# Patient Record
Sex: Male | Born: 1960 | Race: White | Hispanic: No | Marital: Single | Smoking: Current some day smoker
Health system: Southern US, Community
[De-identification: ages and names within clinical notes are randomized; demographics above are authoritative.]

## PROBLEM LIST (undated history)

## (undated) DIAGNOSIS — I10 Essential (primary) hypertension: Secondary | ICD-10-CM

## (undated) DIAGNOSIS — C801 Malignant (primary) neoplasm, unspecified: Secondary | ICD-10-CM

## (undated) DIAGNOSIS — F1021 Alcohol dependence, in remission: Secondary | ICD-10-CM

## (undated) DIAGNOSIS — J449 Chronic obstructive pulmonary disease, unspecified: Secondary | ICD-10-CM

## (undated) DIAGNOSIS — F419 Anxiety disorder, unspecified: Secondary | ICD-10-CM

## (undated) HISTORY — PX: BLADDER SURGERY: SHX569

---

## 2002-05-08 ENCOUNTER — Encounter: Payer: Self-pay | Admitting: Family Medicine

## 2002-05-08 ENCOUNTER — Ambulatory Visit (HOSPITAL_COMMUNITY): Admission: RE | Admit: 2002-05-08 | Discharge: 2002-05-08 | Payer: Self-pay | Admitting: Family Medicine

## 2011-02-21 ENCOUNTER — Encounter: Payer: Self-pay | Admitting: Emergency Medicine

## 2011-02-21 ENCOUNTER — Emergency Department (HOSPITAL_COMMUNITY)
Admission: EM | Admit: 2011-02-21 | Discharge: 2011-02-21 | Disposition: A | Discharge status: Home / Self Care | Payer: Self-pay | Attending: Emergency Medicine | Admitting: Emergency Medicine

## 2011-02-21 DIAGNOSIS — S61509A Unspecified open wound of unspecified wrist, initial encounter: Secondary | ICD-10-CM | POA: Insufficient documentation

## 2011-02-21 DIAGNOSIS — S61519A Laceration without foreign body of unspecified wrist, initial encounter: Secondary | ICD-10-CM

## 2011-02-21 DIAGNOSIS — F17200 Nicotine dependence, unspecified, uncomplicated: Secondary | ICD-10-CM | POA: Insufficient documentation

## 2011-02-21 DIAGNOSIS — W268XXA Contact with other sharp object(s), not elsewhere classified, initial encounter: Secondary | ICD-10-CM | POA: Insufficient documentation

## 2011-02-21 HISTORY — DX: Essential (primary) hypertension: I10

## 2011-02-21 MED ORDER — TETANUS-DIPHTH-ACELL PERTUSSIS 5-2.5-18.5 LF-MCG/0.5 IM SUSP
0.5000 mL | Freq: Once | INTRAMUSCULAR | Status: AC
  Administered 2011-02-21: 0.5 mL via INTRAMUSCULAR
  Filled 2011-02-21: qty 0.5

## 2011-02-21 NOTE — ED Notes (Signed)
Pt c/o laceration to r wrist from chain link fence. Puncture wound noted. Last tetanus > 20 years

## 2011-02-21 NOTE — ED Provider Notes (Signed)
History     CSN: [id number] Arrival date & time: 02/21/2011  3:33 PM  Chief Complaint  Patient presents with  . Laceration   Patient is a 49 y.o. male presenting with skin laceration. The history is provided by the patient. No language interpreter was used.  Laceration  The incident occurred less than 1 hour ago. The laceration is located on the right arm. The laceration is 1 cm in size. Injury mechanism: barb wire fencing. The pain is at a severity of 3/10. The pain has been constant since onset. He reports no foreign bodies present. His tetanus status is out of date.    Past Medical History  Diagnosis Date  . Hypertension     History reviewed. No pertinent past surgical history.  History reviewed. No pertinent family history.  History  Substance Use Topics  . Smoking status: Current Some Day Smoker -- 1.0 packs/day  . Smokeless tobacco: Not on file  . Alcohol Use: 3.6 oz/week    6 Cans of beer per week     heavy      Review of Systems  Skin: Positive for wound.  All other systems reviewed and are negative.    Physical Exam  BP 150/103  Pulse 99  Temp(Src) 98.6 F (37 C) (Oral)  Resp 20  Ht 5\' 8"  (1.727 m)  Wt 158 lb (71.668 kg)  BMI 24.02 kg/m2  SpO2 99%  Physical Exam  Nursing note and vitals reviewed. Constitutional: He is oriented to person, place, and time. Vital signs are normal. He appears well-developed and well-nourished. No distress.  HENT:  Head: Normocephalic and atraumatic.  Right Ear: External ear normal.  Left Ear: External ear normal.  Nose: Nose normal.  Mouth/Throat: No oropharyngeal exudate.  Eyes: Conjunctivae and EOM are normal. Pupils are equal, round, and reactive to light. Right eye exhibits no discharge. Left eye exhibits no discharge. No scleral icterus.  Neck: Normal range of motion. Neck supple. No JVD present. No tracheal deviation present. No thyromegaly present.  Cardiovascular: Normal rate, regular rhythm, normal heart  sounds, intact distal pulses and normal pulses.  Exam reveals no gallop and no friction rub.   No murmur heard. Pulmonary/Chest: Effort normal and breath sounds normal. No stridor. No respiratory distress. He has no wheezes. He has no rales. He exhibits no tenderness.  Abdominal: Soft. Normal appearance and bowel sounds are normal. He exhibits no distension and no mass. There is no tenderness. There is no rebound and no guarding.  Musculoskeletal: Normal range of motion. He exhibits no edema and no tenderness.       Right wrist: He exhibits laceration. He exhibits normal range of motion, no tenderness, no bony tenderness, no swelling, no effusion, no crepitus and no deformity.       Hands: Lymphadenopathy:    He has no cervical adenopathy.  Neurological: He is alert and oriented to person, place, and time. He has normal reflexes. No cranial nerve deficit. Coordination normal. GCS eye subscore is 4. GCS verbal subscore is 5. GCS motor subscore is 6.  Reflex Scores:      Tricep reflexes are 2+ on the right side and 2+ on the left side.      Bicep reflexes are 2+ on the right side and 2+ on the left side.      Brachioradialis reflexes are 2+ on the right side and 2+ on the left side.      Patellar reflexes are 2+ on the right side and 2+  on the left side.      Achilles reflexes are 2+ on the right side and 2+ on the left side. Skin: Skin is warm and dry. No rash noted. He is not diaphoretic.  Psychiatric: He has a normal mood and affect. His speech is normal and behavior is normal. Judgment and thought content normal. Cognition and memory are normal.    ED Course  LACERATION REPAIR Date/Time: 02/21/2011 3:56 PM Performed by: Worthy Rancher Authorized by: Jasmine Awe Consent: Verbal consent obtained. Written consent not obtained. Risks and benefits: risks, benefits and alternatives were discussed Consent given by: patient Patient understanding: patient states understanding of the  procedure being performed Relevant documents: relevant documents not present or verified Test results: test results not available Site marked: the operative site was not marked Imaging studies: imaging studies not available Patient identity confirmed: verbally with patient Time out: Immediately prior to procedure a "time out" was called to verify the correct patient, procedure, equipment, support staff and site/side marked as required. Body area: upper extremity Location details: right wrist Laceration length: 1 cm Foreign bodies: metal Tendon involvement: none Nerve involvement: none Vascular damage: no Anesthesia method: none. Patient sedated: no Amount of cleaning: standard Debridement: none Degree of undermining: none Suture technique: applied drmabond. Approximation: close Approximation difficulty: simple    MDM       Worthy Rancher, PA 02/21/11 1553  Worthy Rancher, PA 02/21/11 1553  Worthy Rancher, [place] 02/21/11 [phone]

## 2011-02-22 NOTE — ED Provider Notes (Signed)
Evaluation and management procedures were performed by the mid-level provider (PA/NP/CNM) under my supervision/collaboration.   Vida Roller, MD 02/22/11 [phone]

## 2016-12-28 ENCOUNTER — Telehealth: Payer: Self-pay

## 2016-12-28 NOTE — Telephone Encounter (Signed)
[phone]  [phone] (SISTER),  PATIENT RECEIVED LETTER TO SCHEDULE TCS  PLEASE CALL HIS SISTER, SHE SAYS HE WILL NOT ANSWER THE PHONE AND SHE IS HIS "SECRETARY" I EXPLAINED TO HER THAT WE MAY NOT BE ABLE TO DISCUSS WITH HER ANY INFORMATION

## 2016-12-28 NOTE — Telephone Encounter (Signed)
Patient called in and stated he's not having any GI problems and he's not on any blood thinners.  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  Patient is taking Prozac and Hydroxyzine pamoate.  Routing to [place] to see if patient requires an ov, he can come in the morning,  Mon-Wed  Patient can be reached at [phone].

## 2017-01-04 ENCOUNTER — Telehealth: Payer: Self-pay

## 2017-01-04 NOTE — Telephone Encounter (Signed)
Pt is coming into the office on Aug 14

## 2017-01-04 NOTE — Telephone Encounter (Signed)
Called and spoke to his sister Arnette Schaumann). She knew some of his medications but not the dose. Some meds are prescribed but he doesn't take them. She is not sure if he would answer the phone if I call him and he doesn't know how to check voicemail. Tried to call pt, no answer. Called sister back and scheduled OV to set-up TCS d/t unable to obtain info that is needed. Sister states she takes him to all appt because he doesn't drive. He hadn't been to the doctor in about 20 years before she got him an appt at [facility]. History of alcohol use according to previous phone note. OV with [facility] scheduled for 03/01/17 at 8:30am. Sister said she will let him know. Routing to DS as FYI.

## 2017-03-01 ENCOUNTER — Ambulatory Visit: Payer: Self-pay | Admitting: Gastroenterology

## 2017-05-02 ENCOUNTER — Encounter: Payer: Self-pay | Admitting: Gastroenterology

## 2017-05-02 ENCOUNTER — Ambulatory Visit: Payer: Self-pay | Admitting: Gastroenterology

## 2017-05-02 ENCOUNTER — Telehealth: Payer: Self-pay | Admitting: Gastroenterology

## 2017-05-02 NOTE — Telephone Encounter (Signed)
PATIENT WAS A NO SHOW AND LETTER SENT  °

## 2017-09-12 ENCOUNTER — Ambulatory Visit (INDEPENDENT_AMBULATORY_CARE_PROVIDER_SITE_OTHER): Payer: Medicaid Other | Admitting: Otolaryngology

## 2017-09-12 DIAGNOSIS — R070 Pain in throat: Secondary | ICD-10-CM | POA: Diagnosis not present

## 2017-09-12 DIAGNOSIS — D487 Neoplasm of uncertain behavior of other specified sites: Secondary | ICD-10-CM

## 2017-09-12 DIAGNOSIS — D380 Neoplasm of uncertain behavior of larynx: Secondary | ICD-10-CM | POA: Diagnosis not present

## 2017-09-13 ENCOUNTER — Other Ambulatory Visit: Payer: Self-pay | Admitting: Otolaryngology

## 2017-09-15 ENCOUNTER — Encounter (HOSPITAL_BASED_OUTPATIENT_CLINIC_OR_DEPARTMENT_OTHER): Payer: Self-pay | Admitting: *Deleted

## 2017-09-16 ENCOUNTER — Encounter (HOSPITAL_COMMUNITY)
Admission: RE | Admit: 2017-09-16 | Discharge: 2017-09-16 | Disposition: A | Discharge status: Home / Self Care | Payer: Medicaid Other | Source: Ambulatory Visit | Attending: Otolaryngology | Admitting: Otolaryngology

## 2017-09-16 DIAGNOSIS — I4510 Unspecified right bundle-branch block: Secondary | ICD-10-CM | POA: Diagnosis not present

## 2017-09-16 DIAGNOSIS — I10 Essential (primary) hypertension: Secondary | ICD-10-CM | POA: Diagnosis not present

## 2017-09-16 DIAGNOSIS — Z01810 Encounter for preprocedural cardiovascular examination: Secondary | ICD-10-CM | POA: Diagnosis not present

## 2017-09-20 ENCOUNTER — Encounter (HOSPITAL_BASED_OUTPATIENT_CLINIC_OR_DEPARTMENT_OTHER)
Admission: RE | Disposition: A | Discharge status: Home / Self Care | Payer: Self-pay | Source: Ambulatory Visit | Attending: Otolaryngology

## 2017-09-20 ENCOUNTER — Ambulatory Visit (HOSPITAL_BASED_OUTPATIENT_CLINIC_OR_DEPARTMENT_OTHER): Payer: Medicaid Other | Admitting: Certified Registered"

## 2017-09-20 ENCOUNTER — Encounter (HOSPITAL_BASED_OUTPATIENT_CLINIC_OR_DEPARTMENT_OTHER): Payer: Self-pay | Admitting: *Deleted

## 2017-09-20 ENCOUNTER — Ambulatory Visit (HOSPITAL_BASED_OUTPATIENT_CLINIC_OR_DEPARTMENT_OTHER)
Admission: RE | Admit: 2017-09-20 | Discharge: 2017-09-20 | Disposition: A | Discharge status: Home / Self Care | Payer: Medicaid Other | Source: Ambulatory Visit | Attending: Otolaryngology | Admitting: Otolaryngology

## 2017-09-20 DIAGNOSIS — F419 Anxiety disorder, unspecified: Secondary | ICD-10-CM | POA: Insufficient documentation

## 2017-09-20 DIAGNOSIS — F329 Major depressive disorder, single episode, unspecified: Secondary | ICD-10-CM | POA: Diagnosis not present

## 2017-09-20 DIAGNOSIS — C329 Malignant neoplasm of larynx, unspecified: Secondary | ICD-10-CM | POA: Diagnosis not present

## 2017-09-20 DIAGNOSIS — D380 Neoplasm of uncertain behavior of larynx: Secondary | ICD-10-CM | POA: Diagnosis not present

## 2017-09-20 DIAGNOSIS — Z79899 Other long term (current) drug therapy: Secondary | ICD-10-CM | POA: Insufficient documentation

## 2017-09-20 DIAGNOSIS — J449 Chronic obstructive pulmonary disease, unspecified: Secondary | ICD-10-CM | POA: Diagnosis not present

## 2017-09-20 DIAGNOSIS — I10 Essential (primary) hypertension: Secondary | ICD-10-CM | POA: Diagnosis not present

## 2017-09-20 DIAGNOSIS — F17210 Nicotine dependence, cigarettes, uncomplicated: Secondary | ICD-10-CM | POA: Diagnosis not present

## 2017-09-20 DIAGNOSIS — Z881 Allergy status to other antibiotic agents status: Secondary | ICD-10-CM | POA: Diagnosis not present

## 2017-09-20 DIAGNOSIS — R221 Localized swelling, mass and lump, neck: Secondary | ICD-10-CM

## 2017-09-20 DIAGNOSIS — J387 Other diseases of larynx: Secondary | ICD-10-CM | POA: Diagnosis present

## 2017-09-20 HISTORY — DX: Anxiety disorder, unspecified: F41.9

## 2017-09-20 HISTORY — DX: Alcohol dependence, in remission: F10.21

## 2017-09-20 HISTORY — PX: MICROLARYNGOSCOPY: SHX5208

## 2017-09-20 SURGERY — MICROLARYNGOSCOPY
Anesthesia: General

## 2017-09-20 MED ORDER — LIDOCAINE 2% (20 MG/ML) 5 ML SYRINGE
INTRAMUSCULAR | Status: AC
  Filled 2017-09-20: qty 5

## 2017-09-20 MED ORDER — PROMETHAZINE HCL 25 MG/ML IJ SOLN: 6.2500 mg | INTRAMUSCULAR | Status: DC | PRN

## 2017-09-20 MED ORDER — LIDOCAINE HCL (CARDIAC) 20 MG/ML IV SOLN
INTRAVENOUS | Status: DC | PRN
  Administered 2017-09-20: 60 mg via INTRAVENOUS

## 2017-09-20 MED ORDER — FENTANYL CITRATE (PF) 100 MCG/2ML IJ SOLN: 25.0000 ug | INTRAMUSCULAR | Status: DC | PRN

## 2017-09-20 MED ORDER — DEXAMETHASONE SODIUM PHOSPHATE 4 MG/ML IJ SOLN
INTRAMUSCULAR | Status: DC | PRN
  Administered 2017-09-20: 10 mg via INTRAVENOUS

## 2017-09-20 MED ORDER — ROCURONIUM BROMIDE 10 MG/ML (PF) SYRINGE
PREFILLED_SYRINGE | INTRAVENOUS | Status: AC
  Filled 2017-09-20: qty 5

## 2017-09-20 MED ORDER — OXYCODONE HCL 5 MG/5ML PO SOLN: 5.0000 mg | Freq: Once | ORAL | Status: DC | PRN

## 2017-09-20 MED ORDER — EPHEDRINE SULFATE 50 MG/ML IJ SOLN
INTRAMUSCULAR | Status: DC | PRN
  Administered 2017-09-20: 15 mg via INTRAVENOUS

## 2017-09-20 MED ORDER — SUCCINYLCHOLINE CHLORIDE 20 MG/ML IJ SOLN
INTRAMUSCULAR | Status: DC | PRN
  Administered 2017-09-20: 120 mg via INTRAVENOUS
  Administered 2017-09-20 (×2): 80 mg via INTRAVENOUS

## 2017-09-20 MED ORDER — MIDAZOLAM HCL 2 MG/2ML IJ SOLN: 1.0000 mg | INTRAMUSCULAR | Status: DC | PRN

## 2017-09-20 MED ORDER — FENTANYL CITRATE (PF) 100 MCG/2ML IJ SOLN
INTRAMUSCULAR | Status: AC
  Filled 2017-09-20: qty 2

## 2017-09-20 MED ORDER — ONDANSETRON HCL 4 MG/2ML IJ SOLN
INTRAMUSCULAR | Status: DC | PRN
  Administered 2017-09-20: 4 mg via INTRAVENOUS

## 2017-09-20 MED ORDER — PROPOFOL 10 MG/ML IV BOLUS
INTRAVENOUS | Status: DC | PRN
  Administered 2017-09-20: 150 mg via INTRAVENOUS

## 2017-09-20 MED ORDER — SCOPOLAMINE 1 MG/3DAYS TD PT72: 1.0000 | MEDICATED_PATCH | Freq: Once | TRANSDERMAL | Status: DC | PRN

## 2017-09-20 MED ORDER — SUCCINYLCHOLINE CHLORIDE 200 MG/10ML IV SOSY
PREFILLED_SYRINGE | INTRAVENOUS | Status: AC
  Filled 2017-09-20: qty 10

## 2017-09-20 MED ORDER — ALBUMIN HUMAN 5 % IV SOLN
INTRAVENOUS | Status: AC
  Filled 2017-09-20: qty 250

## 2017-09-20 MED ORDER — ALBUMIN HUMAN 5 % IV SOLN
12.5000 g | Freq: Once | INTRAVENOUS | Status: AC
  Administered 2017-09-20: 12.5 g via INTRAVENOUS

## 2017-09-20 MED ORDER — PROPOFOL 10 MG/ML IV BOLUS
INTRAVENOUS | Status: AC
  Filled 2017-09-20: qty 20

## 2017-09-20 MED ORDER — MEPERIDINE HCL 25 MG/ML IJ SOLN: 6.2500 mg | INTRAMUSCULAR | Status: DC | PRN

## 2017-09-20 MED ORDER — LACTATED RINGERS IV SOLN
INTRAVENOUS | Status: DC
  Administered 2017-09-20 (×3): via INTRAVENOUS

## 2017-09-20 MED ORDER — LACTATED RINGERS IV SOLN: INTRAVENOUS | Status: DC

## 2017-09-20 MED ORDER — OXYCODONE HCL 5 MG PO TABS: 5.0000 mg | ORAL_TABLET | Freq: Once | ORAL | Status: DC | PRN

## 2017-09-20 MED ORDER — DEXAMETHASONE SODIUM PHOSPHATE 10 MG/ML IJ SOLN
INTRAMUSCULAR | Status: AC
  Filled 2017-09-20: qty 1

## 2017-09-20 MED ORDER — FENTANYL CITRATE (PF) 100 MCG/2ML IJ SOLN
50.0000 ug | INTRAMUSCULAR | Status: DC | PRN
  Administered 2017-09-20: 100 ug via INTRAVENOUS

## 2017-09-20 MED ORDER — ONDANSETRON HCL 4 MG/2ML IJ SOLN
INTRAMUSCULAR | Status: AC
  Filled 2017-09-20: qty 2

## 2017-09-20 MED ORDER — EPINEPHRINE PF 1 MG/ML IJ SOLN
INTRAMUSCULAR | Status: DC | PRN
  Administered 2017-09-20: 1 mg

## 2017-09-20 SURGICAL SUPPLY — 31 items
ADAPTER TUBE FLEX ULTRASET (MISCELLANEOUS) ×2 IMPLANT
CANISTER SUCT 1200ML W/VALVE (MISCELLANEOUS) ×2 IMPLANT
CONT SPEC 4OZ CLIKSEAL STRL BL (MISCELLANEOUS) ×2 IMPLANT
GAUZE SPONGE 4X4 12PLY STRL LF (GAUZE/BANDAGES/DRESSINGS) ×4 IMPLANT
GLOVE BIO SURGEON STRL SZ7 (GLOVE) ×1 IMPLANT
GLOVE BIO SURGEON STRL SZ7.5 (GLOVE) ×2 IMPLANT
GLOVE BIOGEL PI IND STRL 6.5 (GLOVE) IMPLANT
GLOVE BIOGEL PI INDICATOR 6.5 (GLOVE) ×1
GOWN STRL REUS W/ TWL LRG LVL3 (GOWN DISPOSABLE) IMPLANT
GOWN STRL REUS W/ TWL XL LVL3 (GOWN DISPOSABLE) IMPLANT
GOWN STRL REUS W/TWL LRG LVL3 (GOWN DISPOSABLE) ×2
GOWN STRL REUS W/TWL XL LVL3 (GOWN DISPOSABLE) ×2
GUARD TEETH (MISCELLANEOUS) IMPLANT
MARKER SKIN DUAL TIP RULER LAB (MISCELLANEOUS) IMPLANT
NDL HYPO 18GX1.5 BLUNT FILL (NEEDLE) ×1 IMPLANT
NDL SPNL 22GX7 QUINCKE BK (NEEDLE) IMPLANT
NDL SPNL 25GX3.5 QUINCKE BL (NEEDLE) ×1 IMPLANT
NEEDLE HYPO 18GX1.5 BLUNT FILL (NEEDLE) ×2 IMPLANT
NEEDLE SPNL 22GX7 QUINCKE BK (NEEDLE) IMPLANT
NEEDLE SPNL 25GX3.5 QUINCKE BL (NEEDLE) ×2 IMPLANT
NS IRRIG 1000ML POUR BTL (IV SOLUTION) ×2 IMPLANT
PACK BASIN DAY SURGERY FS (CUSTOM PROCEDURE TRAY) ×2 IMPLANT
PATTIES SURGICAL .5 X3 (DISPOSABLE) ×2 IMPLANT
SHEET MEDIUM DRAPE 40X70 STRL (DRAPES) ×2 IMPLANT
SLEEVE SCD COMPRESS KNEE MED (MISCELLANEOUS) IMPLANT
SOLUTION BUTLER CLEAR DIP (MISCELLANEOUS) ×2 IMPLANT
SURGILUBE 2OZ TUBE FLIPTOP (MISCELLANEOUS) IMPLANT
SYR CONTROL 10ML LL (SYRINGE) IMPLANT
SYR TB 1ML LL NO SAFETY (SYRINGE) ×2 IMPLANT
TOWEL OR 17X24 6PK STRL BLUE (TOWEL DISPOSABLE) ×2 IMPLANT
TUBE CONNECTING 20X1/4 (TUBING) ×2 IMPLANT

## 2017-09-20 NOTE — Anesthesia Postprocedure Evaluation (Signed)
Anesthesia Post Note  Patient: Timothy Parks  Procedure(s) Performed: MICRO LARYNGOSCOPY WITH BIOPSY OF LARYNGEAL MASS (N/A )     Patient location during evaluation: PACU Anesthesia Type: General Level of consciousness: awake and alert Pain management: pain level controlled Vital Signs Assessment: post-procedure vital signs reviewed and stable Respiratory status: spontaneous breathing, nonlabored ventilation, respiratory function stable and patient connected to nasal cannula oxygen Cardiovascular status: blood pressure returned to baseline and stable Postop Assessment: no apparent nausea or vomiting Anesthetic complications: no    Last Vitals:  Vitals:   09/20/17 1330 09/20/17 1400  BP: 133/79 140/72  Pulse: 88 83  Resp: 17 16  Temp:  36.4 C  SpO2: 95% 98%    Last Pain:  Vitals:   09/20/17 1400  TempSrc:   PainSc: 3                  Effie Berkshire

## 2017-09-20 NOTE — Transfer of Care (Signed)
Immediate Anesthesia Transfer of Care Note  Patient: Timothy Parks  Procedure(s) Performed: MICRO LARYNGOSCOPY WITH BIOPSY OF LARYNGEAL MASS (N/A )  Patient Location: PACU  Anesthesia Type:General  Level of Consciousness: awake, alert  and oriented  Airway & Oxygen Therapy: Patient Spontanous Breathing and Patient connected to face mask oxygen  Post-op Assessment: Report given to RN and Post -op Vital signs reviewed and unstable, Anesthesiologist notified  Post vital signs: Reviewed, stable and unstable  Last Vitals:  Vitals:   09/20/17 0916  BP: (!) 160/90  Pulse: (!) 109  Resp: 16  Temp: 36.8 C  SpO2: 100%    Last Pain:  Vitals:   09/20/17 0916  TempSrc: Oral  PainSc: 0-No pain         Complications: No apparent anesthesia complications

## 2017-09-20 NOTE — H&P (Signed)
Cc: Neck mass  HPI: The patient is a 57 y/o male who presents today for evaluation of a neck mass. The patient is seen in consultation requested by [facility] The patient first noticed the mass 4 months ago. It has not changed in size and is not painful. The patient has noted intermittent throat pain when he swallows a certain way. He denies any difficulty breathing or eating. No imaging has been obtained. The patient was a 3 ppd smoker but is now down to 2 cigarettes per day. No previous ENT surgery is noted.   The patient's review of systems (constitutional, eyes, ENT, cardiovascular, respiratory, GI, musculoskeletal, skin, neurologic, psychiatric, endocrine, hematologic, allergic) is noted in the ROS questionnaire.  It is reviewed with the patient.   Family health history: None.  Major events: Bladder surgery as a child.  Ongoing medical problems: Depression, hypertension, COPD, reflux, anxiety.  Social history: The patient is single. He chews tobacco daily. Previous heavy smoker. He denies the use of alcohol or illegal drugs. He is a recovering alcoholic.   Exam General: Communicates without difficulty, well nourished, no acute distress. Head: Normocephalic, no evidence injury, no tenderness, facial buttresses intact without stepoff. Eyes: PERRL, EOMI.  No scleral icterus, conjunctivae clear. Ears: External auditory canals clear bilaterally.  There is no edema or erythema.  Tympanic membrane is within normal limits bilaterally. Nose: Normal skin and external support.  Anterior rhinoscopy reveals healthy pink mucosa over the septum and turbinates.  No lesions or polyps were seen. Oral cavity: Lips without lesions, oral mucosa moist, no masses or lesions seen. Indirect  mirror laryngoscopy could not be tolerated. Pharynx: Clear, no erythema. Neck: Supple, full range of motion. Left level II, 2 cm firm neck mass. Salivary: Parotid and submandibular glands without mass. Neuro:  CN 2-12  grossly intact. Gait normal. Vestibular: No nystagmus at any point of gaze.   Procedure:  Flexible Fiberoptic Laryngoscopy -- Risks, benefits, and alternatives of flexible endoscopy were explained to the patient.  Specific mention was made of the risk of throat numbness with difficulty swallowing, possible bleeding from the nose and mouth, and pain from the procedure.  The patient gave oral consent to proceed.  The nasal cavities were decongested and anesthetised with a combination of oxymetazoline and 4% lidocaine solution.  The flexible scope was inserted into the right nasal cavity and advanced towards the nasopharynx.  Visualized mucosa over the turbinates and septum were as described above.  The nasopharynx was clear.  Oropharyngeal walls were symmetric and mobile without lesion, mass, or edema. A fungating 1 cm mass at the left arytenoid.  Base of tongue was within normal limits.  The patient tolerated the procedure well.   Assessment A fungating 1 cm mass is noted at the left arytenoid, with a level II firm lymph node. 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  Plan  1. The physical exam and laryngoscopy findings are reviewed with the patient. Differential diagnosis is also discussed.  2. Plan micro DL with biopsy. The risks, benefits, alternatives, and details of the procedure are reviewed with the patient. Questions are invited and answered. 3. Neck CT with contrast.

## 2017-09-20 NOTE — Anesthesia Preprocedure Evaluation (Addendum)
Anesthesia Evaluation  Patient identified by MRN, date of birth, ID band Patient awake    Reviewed: Allergy & Precautions, NPO status , Patient's Chart, lab work & pertinent test results  Airway Mallampati: III  TM Distance: >3 FB Neck ROM: Full    Dental  (+) Partial Lower, Partial Upper, Dental Advisory Given   Pulmonary Current Smoker,    breath sounds clear to auscultation (-) decreased breath sounds      Cardiovascular hypertension, Pt. on medications  Rhythm:Regular Rate:Normal     Neuro/Psych PSYCHIATRIC DISORDERS Anxiety negative neurological ROS     GI/Hepatic negative GI ROS, Neg liver ROS,   Endo/Other  negative endocrine ROS  Renal/GU negative Renal ROS     Musculoskeletal negative musculoskeletal ROS (+)   Abdominal Normal abdominal exam  (+)   Peds negative pediatric ROS (+)  Hematology   Anesthesia Other Findings   Reproductive/Obstetrics negative OB ROS                            Anesthesia Physical Anesthesia Plan  ASA: II  Anesthesia Plan: General   Post-op Pain Management:    Induction: Intravenous  PONV Risk Score and Plan: 2 and Ondansetron and Dexamethasone  Airway Management Planned: Oral ETT  Additional Equipment: None  Intra-op Plan:   Post-operative Plan: Extubation in OR  Informed Consent: I have reviewed the patients History and Physical, chart, labs and discussed the procedure including the risks, benefits and alternatives for the proposed anesthesia with the patient or authorized representative who has indicated his/her understanding and acceptance.   Dental advisory given  Plan Discussed with: CRNA  Anesthesia Plan Comments:         Anesthesia Quick Evaluation

## 2017-09-20 NOTE — Discharge Instructions (Addendum)
The patient may resume all his previous activities and diet. He will follow-up in my [place] office in one      [place] Instructions  Activity: Get plenty of rest for the remainder of the day. A responsible individual must stay with you for 24 hours following the procedure.  For the next 24 hours, DO NOT: -Drive a car -Paediatric nurse -Drink alcoholic beverages -Take any medication unless instructed by your physician -Make any legal decisions or sign important papers.  Meals: Start with liquid foods such as gelatin or soup. Progress to regular foods as tolerated. Avoid greasy, spicy, heavy foods. If nausea and/or vomiting occur, drink only clear liquids until the nausea and/or vomiting subsides. Call your physician if vomiting continues.  Special Instructions/Symptoms: Your throat may feel dry or sore from the anesthesia or the breathing tube placed in your throat during surgery. If this causes discomfort, gargle with warm salt water. The discomfort should disappear within 24 hours.  If you had a scopolamine patch placed behind your ear for the management of post- operative nausea and/or vomiting:  1. The medication in the patch is effective for 72 hours, after which it should be removed.  Wrap patch in a tissue and discard in the trash. Wash hands thoroughly with soap and water. 2. You may remove the patch earlier than 72 hours if you experience unpleasant side effects which may include dry mouth, dizziness or visual disturbances. 3. Avoid touching the patch. Wash your hands with soap and water after contact with the patch.   Post Anesthesia Home Care Instructions  Activity: Get plenty of rest for the remainder of the day. A responsible individual must stay with you for 24 hours following the procedure.  For the next 24 hours, DO NOT: -Drive a car -Paediatric nurse -Drink alcoholic beverages -Take any medication unless instructed by your physician -Make  any legal decisions or sign important papers.  Meals: Start with liquid foods such as gelatin or soup. Progress to regular foods as tolerated. Avoid greasy, spicy, heavy foods. If nausea and/or vomiting occur, drink only clear liquids until the nausea and/or vomiting subsides. Call your physician if vomiting continues.  Special Instructions/Symptoms: Your throat may feel dry or sore from the anesthesia or the breathing tube placed in your throat during surgery. If this causes discomfort, gargle with warm salt water. The discomfort should disappear within 24 hours.  If you had a scopolamine patch placed behind your ear for the management of post- operative nausea and/or vomiting:  1. The medication in the patch is effective for 72 hours, after which it should be removed.  Wrap patch in a tissue and discard in the trash. Wash hands thoroughly with soap and water. 2. You may remove the patch earlier than 72 hours if you experience unpleasant side effects which may include dry mouth, dizziness or visual disturbances. 3. Avoid touching the patch. Wash your hands with soap and water after contact with the patch.

## 2017-09-20 NOTE — Op Note (Signed)
DATE OF PROCEDURE:  09/20/2017                              OPERATIVE REPORT  SURGEON:  Leta Baptist, MD  PREOPERATIVE DIAGNOSES: 1. Left laryngeal mass. 2. Left level II neck mass.  POSTOPERATIVE DIAGNOSES: 1. Left laryngeal mass. 2. Left level II neck mass.  PROCEDURE PERFORMED:  MicroDirect laryngoscopy with biopsy of laryngeal mass.  ANESTHESIA:  General endotracheal tube anesthesia.  COMPLICATIONS:  None.  ESTIMATED BLOOD LOSS:  Minimal.  INDICATION FOR PROCEDURE:  Timothy Parks is a 58 y.o. male who noted a left neck mass 4 months ago. The patient has been complaining of intermittent left-sided throat pain. He has no breathing difficulty. On his fiberoptic laryngoscopy examination, a fungating 1 cm mass was noted to be attached to the left arytenoid. 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 Based on the above findings, the decision was made for the patient to undergo the above-stated procedure.  The risks, benefits, alternatives, and details of the procedure were discussed with the patient.  Questions were invited and answered.  Informed consent was obtained.  DESCRIPTION:  The patient was taken to the operating room and placed supine on the operating table.  General endotracheal tube anesthesia was administered by the anesthesiologist.  The patient was positioned and prepped and draped in a standard fashion for direct laryngoscopy examination.  A Dedo laryngoscope was used for examination. The laryngoscope was inserted via the oral cavity into the pharynx. Examination of the vallecula, epiglottis, aryepiglottic folds, and piriform sinuses were all normal. Examination of the glottis showed a large 1 cm mass with attachment to the left arytenoid. Photodocumentation of the lesion was obtained. The Dedo laryngoscope was suspended with a Lewy suspender.  A microscope was brought into the field. Under the operating microscope, 5 biopsy specimens were  obtained from the laryngeal mass. Hemostasis was achieved with pledgets soaked with epinephrine. The Dedo laryngoscope was withdrawn.   The care of the patient was turned over to the anesthesiologist.  The patient was awakened from anesthesia without difficulty.  The patient was extubated and transferred to the recovery room in good condition.  OPERATIVE FINDINGS:  A 1 cm fungating mass was noted to be attached to the left arytenoid.  SPECIMEN: Laryngeal mass biopsy specimens.   FOLLOWUP CARE:  The patient will be discharged home once awake and alert. The patient will follow up in my office in approximately 1 week.  [name] 09/20/2017 12:07 PM

## 2017-09-20 NOTE — Anesthesia Procedure Notes (Addendum)
Procedure Name: Intubation Performed by: Verita Lamb, CRNA Pre-anesthesia Checklist: Emergency Drugs available, Patient identified, Suction available, Patient being monitored and Timeout performed Oxygen Delivery Method: Circle system utilized Preoxygenation: Pre-oxygenation with 100% oxygen Induction Type: IV induction Ventilation: Mask ventilation without difficulty Laryngoscope Size: Mac and 3 Grade View: Grade I Tube type: Oral Number of attempts: 1 Airway Equipment and Method: Stylet Secured at: 24 cm Tube secured with: Tape Dental Injury: Teeth and Oropharynx as per pre-operative assessment  Comments: Large mass at base of cords obscured glottic opening at first  With further manipulation I was able to achieve a view and insert ett.

## 2017-09-21 ENCOUNTER — Encounter (HOSPITAL_BASED_OUTPATIENT_CLINIC_OR_DEPARTMENT_OTHER): Payer: Self-pay | Admitting: Otolaryngology

## 2017-09-21 ENCOUNTER — Other Ambulatory Visit (INDEPENDENT_AMBULATORY_CARE_PROVIDER_SITE_OTHER): Payer: Self-pay | Admitting: Otolaryngology

## 2017-09-21 DIAGNOSIS — R221 Localized swelling, mass and lump, neck: Secondary | ICD-10-CM

## 2017-09-27 ENCOUNTER — Ambulatory Visit (HOSPITAL_COMMUNITY)
Admission: RE | Admit: 2017-09-27 | Discharge: 2017-09-27 | Disposition: A | Discharge status: Home / Self Care | Payer: Medicaid Other | Source: Ambulatory Visit | Attending: Otolaryngology | Admitting: Otolaryngology

## 2017-09-27 DIAGNOSIS — R599 Enlarged lymph nodes, unspecified: Secondary | ICD-10-CM | POA: Diagnosis not present

## 2017-09-27 DIAGNOSIS — R221 Localized swelling, mass and lump, neck: Secondary | ICD-10-CM | POA: Diagnosis present

## 2017-09-27 DIAGNOSIS — M47812 Spondylosis without myelopathy or radiculopathy, cervical region: Secondary | ICD-10-CM | POA: Insufficient documentation

## 2017-09-27 MED ORDER — IOPAMIDOL (ISOVUE-300) INJECTION 61%
75.0000 mL | Freq: Once | INTRAVENOUS | Status: AC | PRN
  Administered 2017-09-27: 75 mL via INTRAVENOUS

## 2017-09-29 ENCOUNTER — Inpatient Hospital Stay (HOSPITAL_COMMUNITY): Payer: Medicaid Other | Attending: Hematology | Admitting: Hematology

## 2017-09-29 ENCOUNTER — Other Ambulatory Visit: Payer: Self-pay

## 2017-09-29 ENCOUNTER — Encounter: Payer: Self-pay | Admitting: General Practice

## 2017-09-29 ENCOUNTER — Ambulatory Visit (INDEPENDENT_AMBULATORY_CARE_PROVIDER_SITE_OTHER): Payer: Medicaid Other | Admitting: Otolaryngology

## 2017-09-29 ENCOUNTER — Encounter (HOSPITAL_COMMUNITY): Payer: Self-pay | Admitting: Lab

## 2017-09-29 ENCOUNTER — Ambulatory Visit (HOSPITAL_COMMUNITY): Payer: Medicaid Other | Admitting: Hematology

## 2017-09-29 ENCOUNTER — Encounter (HOSPITAL_COMMUNITY): Payer: Self-pay | Admitting: Hematology

## 2017-09-29 VITALS — BP 142/79 | HR 93 | Temp 98.4°F | Resp 18 | Wt 204.8 lb

## 2017-09-29 DIAGNOSIS — F17210 Nicotine dependence, cigarettes, uncomplicated: Secondary | ICD-10-CM | POA: Insufficient documentation

## 2017-09-29 DIAGNOSIS — Z7189 Other specified counseling: Secondary | ICD-10-CM | POA: Insufficient documentation

## 2017-09-29 DIAGNOSIS — I10 Essential (primary) hypertension: Secondary | ICD-10-CM | POA: Insufficient documentation

## 2017-09-29 DIAGNOSIS — F1021 Alcohol dependence, in remission: Secondary | ICD-10-CM | POA: Insufficient documentation

## 2017-09-29 DIAGNOSIS — C329 Malignant neoplasm of larynx, unspecified: Secondary | ICD-10-CM | POA: Insufficient documentation

## 2017-09-29 DIAGNOSIS — F439 Reaction to severe stress, unspecified: Secondary | ICD-10-CM

## 2017-09-29 NOTE — Progress Notes (Signed)
[place] Psychosocial Distress Screening Clinical Social Work  Clinical Social Work was referred by distress screening protocol.  The patient scored a 7 on the Psychosocial Distress Thermometer which indicates moderate distress. Clinical Social Worker Edwyna Shell to assess for distress and other psychosocial needs. Anticipates minor difficulty w transportation, will depend on family primarily.  Is not aware of Medicaid transportation service, advised to call DSS to schedule rides if needed.  Also will mail information about cancer support resources in [place] and refer to [facility].  Lives alone but has support from mother and siblings.  "I am doing OK, I just try not to think about it!"  Was surprised by diagnosis of cancer - "I just had a cold and went to the doctor, I didn't expect cancer!"  CSW and patient discussed common feeling and emotions when being diagnosed with cancer, and the importance of support during treatment. CSW informed patient of the support team and support services at [facility]. CSW provided contact information and encouraged patient to call with any questions or concerns.  ONCBCN DISTRESS SCREENING 09/29/2017  Screening Type Initial Screening  Distress experienced in past week (1-10) 7  Emotional problem type Adjusting to illness  Information Concerns Type Lack of info about diagnosis;Lack of info about treatment  Physician notified of physical symptoms Yes    Clinical Social Worker follow up needed: No.  If yes, follow up plan:  Edwyna Shell, LCSW Clinical Social Worker Phone:  [phone]

## 2017-09-29 NOTE — Progress Notes (Signed)
[facility] NOTE  Patient Care Team: [facility], [place] as PCP - General (Family Medicine)  CHIEF COMPLAINTS/PURPOSE OF CONSULTATION: Laryngeal cancer.   HISTORY OF PRESENTING ILLNESS:  Timothy Parks 57 y.o. male is here because of newly diagnosed laryngeal cancer.  He noticed a sore throat and left neck swelling about 4 months ago.  He was evaluated by his PCP and was referred to Dr. Benjamine Mola.  A laryngoscopic exam showed 1 cm fungating mass attached to the left arytenoid.  CT neck was done on 09/27/2017 which showed a left level 2 lymph node measuring 2.3 x 2.4 x 3.2 cm.  He underwent a MicroDirect laryngoscopy with biopsy on 09/20/2017.  He denies any difficulty swallowing.  He has slight pain while swallowing which has improved since biopsy.  He is able to eat all sorts of foods.  No weight loss was reported.  He denies any tingling or numbness in extremities.  He was a heavy drinker and heavy smoker, but quit drinking 5 years ago.  He is currently smoking 2-3 cigarettes/day for the last few years.  Prior to that he smoked about 3 packs/day.  He is currently on disability but worked for [employer].  He lives by himself at home, he is accompanied today by his mother.  MEDICAL HISTORY:  Past Medical History:  Diagnosis Date  . Anxiety   . Hypertension   . Recovering alcoholic ([place])     SURGICAL HISTORY: Past Surgical History:  Procedure Laterality Date  . BLADDER SURGERY    . MICROLARYNGOSCOPY N/A 09/20/2017   Procedure: MICRO LARYNGOSCOPY WITH BIOPSY OF LARYNGEAL MASS;  Surgeon: Leta Baptist, MD;  Location: [place];  Service: ENT;  Laterality: N/A;    SOCIAL HISTORY: Social History   Socioeconomic History  . Marital status: Single    Spouse name: Not on file  . Number of children: Not on file  . Years of education: Not on file  . Highest education level: Not on file  Social Needs  . Financial  resource strain: Not on file  . Food insecurity - worry: Not on file  . Food insecurity - inability: Not on file  . Transportation needs - medical: Not on file  . Transportation needs - non-medical: Not on file  Occupational History  . Not on file  Tobacco Use  . Smoking status: Current Some Day Smoker    Packs/day: 0.00    Types: Cigarettes  . Smokeless tobacco: Current User    Types: Snuff  . Tobacco comment: 2 cigarettes with coffee in the morning  Substance and Sexual Activity  . Alcohol use: No    Frequency: Never    Comment: in recovery x since 2013  . Drug use: No  . Sexual activity: Not Currently  Other Topics Concern  . Not on file  Social History Narrative  . Not on file    FAMILY HISTORY: Family History  Problem Relation Age of Onset  . Bladder Cancer Mother        71  . Colon cancer Father   . Thyroid disease Sister   . Heart disease Brother        stent placement  . Heart disease Maternal Grandmother   . Diabetes Maternal Grandfather   . Cancer Paternal Grandmother   . AAA (abdominal aortic aneurysm) Paternal Grandfather     ALLERGIES:  is allergic to tetracyclines & related.  MEDICATIONS:  Current Outpatient Medications  Medication Sig  Dispense Refill  . amLODipine (NORVASC) 10 MG tablet Take 10 mg by mouth daily.    Marland Kitchen FLUoxetine (PROZAC) 20 MG tablet Take 20 mg by mouth daily.    Marland Kitchen lovastatin (MEVACOR) 20 MG tablet Take 20 mg by mouth at bedtime.     No current facility-administered medications for this visit.     REVIEW OF SYSTEMS:   Constitutional: Denies fevers, chills or abnormal night sweats Eyes: Denies blurriness of vision, double vision or watery eyes Ears, nose, mouth, throat, and face: Denies mucositis.  He has some sore throat when he swallows. Respiratory: Denies cough, dyspnea or wheezes Cardiovascular: Denies palpitation, chest discomfort or lower extremity swelling Gastrointestinal:  Denies nausea, heartburn or change in bowel  habits Skin: Denies abnormal skin rashes Lymphatics: Denies new lymphadenopathy or easy bruising Neurological:Denies numbness, tingling or new weaknesses Behavioral/Psych: Mood is stable, no new changes  All other systems were reviewed with the patient and are negative.  PHYSICAL EXAMINATION: ECOG PERFORMANCE STATUS: 1 - Symptomatic but completely ambulatory  Vitals:   09/29/17 1409  BP: (!) 142/79  Pulse: 93  Resp: 18  Temp: 98.4 F (36.9 C)  SpO2: 100%   Filed Weights   09/29/17 1409  Weight: 204 lb 12.8 oz (92.9 kg)    GENERAL:alert, no distress and comfortable SKIN: skin color, texture, turgor are normal, no rashes or significant lesions EYES: normal, conjunctiva are pink and non-injected, sclera clear OROPHARYNX:no exudate, no erythema and lips, buccal mucosa, and tongue normal  NECK: There is a 3 cm level 2 lymph node on the left neck.  No other palpable adenopathy. LYMPH:  no palpable lymphadenopathy in the axillary or inguinal LUNGS: clear to auscultation and percussion with normal breathing effort HEART: regular rate & rhythm and no murmurs and no lower extremity edema ABDOMEN:abdomen soft, non-tender and normal bowel sounds Musculoskeletal:no cyanosis of digits and no clubbing  PSYCH: alert & oriented x 3 with fluent speech NEURO: no focal motor/sensory deficits  LABORATORY DATA:  I have reviewed the data as listed No results found for: WBC, HGB, HCT, MCV, PLT   Chemistry   No results found for: NA, K, CL, CO2, BUN, CREATININE, GLU No results found for: CALCIUM, ALKPHOS, AST, ALT, BILITOT     RADIOGRAPHIC STUDIES: I have personally reviewed the radiological images as listed and agreed with the findings in the report. Ct Soft Tissue Neck W Contrast  Result Date: 09/27/2017 CLINICAL DATA:  Head and neck cancer.  Left-sided neck mass. EXAM: CT NECK WITH CONTRAST TECHNIQUE: Multidetector CT imaging of the neck was performed using the standard protocol following  the bolus administration of intravenous contrast. CONTRAST:  102mL ISOVUE-300 IOPAMIDOL (ISOVUE-300) INJECTION 61% COMPARISON:  None. FINDINGS: Pharynx and larynx: The palatine tonsils are prominent bilaterally. No discrete mass lesion is present. There is some patient motion at the level of the hypopharynx. Repeat images demonstrate asymmetric soft tissue in the left piriform sinus. Tongue base is within normal limits. The epiglottis is unremarkable. Salivary glands: The submandibular and parotid glands are within normal limits bilaterally. Thyroid: Within normal limits. Lymph nodes: A left level 2 lymph node demonstrates peripheral enhancement and measures 2.3 x 2.4 x 3.2 cm. No other discrete pathologic nodes are present. No significant right-sided nodes are present. Vascular: Vascular calcifications are present at the carotid bifurcations bilaterally. There is no significant stenosis. Limited intracranial: Negative. Visualized orbits: Within normal limits. Mastoids and visualized paranasal sinuses: Mucosal thickening is present in the left maxillary sinus inferiorly. Wall  thickening suggests a history of chronic or recurrent disease. The remaining paranasal sinuses and the mastoid air cells are clear. Skeleton: Axial skeleton demonstrates degenerative endplate changes and uncovertebral spurring worse on the right at C4-5 and left greater than right at C6-7. No focal lytic or blastic lesions are present. Upper chest: The lung apices are clear. The thoracic inlet is within normal limits. IMPRESSION: 1. Solitary left level 2 lymph node measures 2.3 x 2.4 x 3.2 cm, likely reflecting metastatic disease. 2. The study is degraded by patient motion. Asymmetric soft tissue is suspected along the left piriform sinus. Please correlate with exam. 3. Mild degenerative changes in the cervical spine. Electronically Signed   By: San Morelle M.D.   On: 09/27/2017 11:22    ASSESSMENT & PLAN:  1. 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  I have recommended dental evaluation, speech and swallow evaluation.  I would also order a PET CT scan.  I have called Dr. Quitman Livings for a radiation consultation, who kindly agreed to see this patient tomorrow.  Total time spent is 45 minutes with more than 50% of the time spent face-to-face discussing his diagnosis, prognosis, and coordination of care..  Orders Placed This Encounter  Procedures  . NM PET Image Initial (PI) Skull Base To Thigh    Order Specific Question:   If indicated for the ordered procedure, I authorize the administration of a radiopharmaceutical per Radiology protocol    Answer:   Yes    Order Specific Question:   Preferred imaging location?    Answer:   [facility]    Order Specific Question:   Radiology Contrast Protocol - do NOT remove file path    Answer:   [email or web address]    Order Specific Question:   Reason for Exam additional comments    Answer:   Newly diagnosed laryngeal cancer with positive lymph node in the neck, initial staging  . Ambulatory referral to Social Work    Referral Priority:   Routine    Referral Type:   Consultation    Referral Reason:   Specialty Services Required    Number of Visits Requested:   1  . SLP clinical swallow evaluation    Standing Status:   Future    Standing Expiration Date:   09/30/2018       Derek Jack, MD 09/29/2017 5:07 PM

## 2017-09-29 NOTE — Progress Notes (Unsigned)
Referral sent to Rad Onc in [place]. Records faxed on 3/14

## 2017-09-30 ENCOUNTER — Ambulatory Visit (HOSPITAL_COMMUNITY): Payer: Medicaid Other | Admitting: Hematology

## 2017-10-03 ENCOUNTER — Encounter (HOSPITAL_COMMUNITY)
Admission: RE | Admit: 2017-10-03 | Discharge: 2017-10-03 | Disposition: A | Discharge status: Home / Self Care | Payer: Medicaid Other | Source: Ambulatory Visit | Attending: Hematology | Admitting: Hematology

## 2017-10-03 MED ORDER — FLUDEOXYGLUCOSE F - 18 (FDG) INJECTION
14.0000 | Freq: Once | INTRAVENOUS | Status: AC
  Administered 2017-10-03: 14.2 via INTRAVENOUS

## 2017-10-04 ENCOUNTER — Ambulatory Visit (HOSPITAL_COMMUNITY)
Admission: RE | Admit: 2017-10-04 | Discharge: 2017-10-04 | Disposition: A | Discharge status: Home / Self Care | Payer: Medicaid Other | Source: Ambulatory Visit | Attending: Hematology | Admitting: Hematology

## 2017-10-04 ENCOUNTER — Other Ambulatory Visit (HOSPITAL_COMMUNITY): Payer: Self-pay | Admitting: Hematology

## 2017-10-04 DIAGNOSIS — J320 Chronic maxillary sinusitis: Secondary | ICD-10-CM | POA: Diagnosis not present

## 2017-10-04 DIAGNOSIS — N200 Calculus of kidney: Secondary | ICD-10-CM | POA: Insufficient documentation

## 2017-10-04 DIAGNOSIS — C329 Malignant neoplasm of larynx, unspecified: Secondary | ICD-10-CM | POA: Diagnosis not present

## 2017-10-04 DIAGNOSIS — I700 Atherosclerosis of aorta: Secondary | ICD-10-CM | POA: Insufficient documentation

## 2017-10-04 DIAGNOSIS — D3502 Benign neoplasm of left adrenal gland: Secondary | ICD-10-CM | POA: Diagnosis not present

## 2017-10-04 DIAGNOSIS — I2510 Atherosclerotic heart disease of native coronary artery without angina pectoris: Secondary | ICD-10-CM | POA: Insufficient documentation

## 2017-10-04 MED ORDER — FLUDEOXYGLUCOSE F - 18 (FDG) INJECTION: 14.2000 | Freq: Once | INTRAVENOUS | Status: DC

## 2017-10-05 ENCOUNTER — Encounter (HOSPITAL_COMMUNITY): Payer: Medicaid Other

## 2017-10-06 ENCOUNTER — Ambulatory Visit (HOSPITAL_COMMUNITY): Payer: Self-pay | Admitting: Dentistry

## 2017-10-06 ENCOUNTER — Encounter (HOSPITAL_COMMUNITY): Payer: Self-pay | Admitting: Dentistry

## 2017-10-06 VITALS — BP 100/67 | HR 73 | Temp 98.4°F

## 2017-10-06 DIAGNOSIS — K06010 Localized gingival recession, unspecified: Secondary | ICD-10-CM | POA: Diagnosis not present

## 2017-10-06 DIAGNOSIS — C329 Malignant neoplasm of larynx, unspecified: Secondary | ICD-10-CM | POA: Diagnosis not present

## 2017-10-06 DIAGNOSIS — M264 Malocclusion, unspecified: Secondary | ICD-10-CM

## 2017-10-06 DIAGNOSIS — K0530 Chronic periodontitis, unspecified: Secondary | ICD-10-CM

## 2017-10-06 DIAGNOSIS — K029 Dental caries, unspecified: Secondary | ICD-10-CM

## 2017-10-06 DIAGNOSIS — K0850 Unsatisfactory restoration of tooth, unspecified: Secondary | ICD-10-CM

## 2017-10-06 DIAGNOSIS — K036 Deposits [accretions] on teeth: Secondary | ICD-10-CM | POA: Diagnosis not present

## 2017-10-06 DIAGNOSIS — K08409 Partial loss of teeth, unspecified cause, unspecified class: Secondary | ICD-10-CM

## 2017-10-06 DIAGNOSIS — Z01818 Encounter for other preprocedural examination: Secondary | ICD-10-CM

## 2017-10-06 MED ORDER — SODIUM FLUORIDE 1.1 % DT CREA: TOPICAL_CREAM | DENTAL | 99 refills | Status: DC

## 2017-10-06 NOTE — Progress Notes (Signed)
DENTAL CONSULTATION  Date of Consultation:  10/06/2017 Patient Name:   Timothy Parks Date of Birth:   Apr 04, 1961 Medical Record Number: [id number]  VITALS: BP 100/67 (BP Location: Right Arm)   Pulse 73   Temp 98.4 F (36.9 C)   CHIEF COMPLAINT: Patient referred by Dr. Delton Coombes for a dental consultation.  HPI: Timothy Parks is a 57 year old male recently diagnosed with laryngeal cancer. Patient with anticipated chemoradiation therapy. Patient is now seen as part of a pre-chemoradiation therapy dental protocol examination.  Patient currently denies acute toothaches, swellings, or abscesses. Patient was last seen by dentist in August 2018 for insertion of upper and lower acrylic partial dentures. This was by Dr. Mina Marble in [place], [place]. Dr. Mina Marble had been providing dental Treatment since March 2018 and the patient was seen for an exam, periodontal therapy, multiple dental restorations, multiple dental extractions and subsequent fabrication of upper and lower acrylic partial dentures. The patient currently denies having any unmet dental needs. Patient denies having dental phobia.  PROBLEM LIST: Patient Active Problem List   Diagnosis Date Noted  . Laryngeal cancer ([place]) 09/29/2017    Priority: High  . Goals of care, counseling/discussion 09/29/2017    PMH: Past Medical History:  Diagnosis Date  . Anxiety   . Hypertension   . Recovering alcoholic ([place])     PSH: Past Surgical History:  Procedure Laterality Date  . BLADDER SURGERY    . MICROLARYNGOSCOPY N/A 09/20/2017   Procedure: MICRO LARYNGOSCOPY WITH BIOPSY OF LARYNGEAL MASS;  Surgeon: Leta Baptist, MD;  Location: [place];  Service: ENT;  Laterality: N/A;    ALLERGIES: Allergies  Allergen Reactions  . Tetracyclines & Related Anxiety    MEDICATIONS: Current Outpatient Medications  Medication Sig Dispense Refill  . amLODipine (NORVASC) 10 MG tablet Take 10 mg by mouth  daily.    Marland Kitchen FLUoxetine (PROZAC) 20 MG tablet Take 20 mg by mouth daily.    Marland Kitchen lovastatin (MEVACOR) 20 MG tablet Take 20 mg by mouth at bedtime.     No current facility-administered medications for this visit.    Facility-Administered Medications Ordered in Other Visits  Medication Dose Route Frequency Provider Last Rate Last Dose  . fludeoxyglucose F - 18 (FDG) injection 03.4 millicurie  74.2 millicurie Intravenous Once Barnet Glasgow, MD        LABS: No results found for: WBC, HGB, HCT, MCV, PLT No results found for: NA, K, CL, CO2, GLUCOSE, BUN, CREATININE, CALCIUM, GFRNONAA, GFRAA No results found for: INR, PROTIME No results found for: PTT  SOCIAL HISTORY: Social History   Socioeconomic History  . Marital status: Single    Spouse name: Not on file  . Number of children: Not on file  . Years of education: Not on file  . Highest education level: Not on file  Occupational History  . Not on file  Social Needs  . Financial resource strain: Not on file  . Food insecurity:    Worry: Not on file    Inability: Not on file  . Transportation needs:    Medical: Not on file    Non-medical: Not on file  Tobacco Use  . Smoking status: Current Some Day Smoker    Packs/day: 0.00    Types: Cigarettes  . Smokeless tobacco: Current User    Types: Snuff  . Tobacco comment: 2 cigarettes with coffee in the morning  Substance and Sexual Activity  . Alcohol use: No    Frequency: Never  Comment: in recovery x since 2013  . Drug use: No  . Sexual activity: Not Currently  Lifestyle  . Physical activity:    Days per week: Not on file    Minutes per session: Not on file  . Stress: Not on file  Relationships  . Social connections:    Talks on phone: Not on file    Gets together: Not on file    Attends religious service: Not on file    Active member of club or organization: Not on file    Attends meetings of clubs or organizations: Not on file    Relationship status: Not on file  .  Intimate partner violence:    Fear of current or ex partner: Not on file    Emotionally abused: Not on file    Physically abused: Not on file    Forced sexual activity: Not on file  Other Topics Concern  . Not on file  Social History Narrative  . Not on file    FAMILY HISTORY: Family History  Problem Relation Age of Onset  . Bladder Cancer Mother        22  . Colon cancer Father   . Thyroid disease Sister   . Heart disease Brother        stent placement  . Heart disease Maternal Grandmother   . Diabetes Maternal Grandfather   . Cancer Paternal Grandmother   . AAA (abdominal aortic aneurysm) Paternal Grandfather     REVIEW OF SYSTEMS: Reviewed with the patient as per History of present illness. Psych: Patient denies having dental phobia.  DENTAL HISTORY: CHIEF COMPLAINT: Patient referred by Dr. Delton Coombes for a dental consultation.  HPI: Timothy Parks is a 57 year old male recently diagnosed with laryngeal cancer. Patient with anticipated chemoradiation therapy. Patient is now seen as part of a pre-chemoradiation therapy dental protocol examination.  Patient currently denies acute toothaches, swellings, or abscesses. Patient was last seen by dentist in August 2018 for insertion of upper and lower acrylic partial dentures. This was by Dr. Mina Marble in [place], [place]. Dr. Mina Marble had been providing dental Treatment since March 2018 and the patient was seen for an exam, periodontal therapy, multiple dental restorations, multiple dental extractions and subsequent fabrication of upper and lower acrylic partial dentures. The patient currently denies having any unmet dental needs. Patient denies having dental phobia.  DENTAL EXAMINATION: GENERAL:  The patient is a well-developed, well-nourished male in no acute distress. HEAD AND NECK: The patient has left neck lymphadenopathy. The patient has no palpable right neck lymphadenopathy. The patient denies acute TMJ  symptoms. Maximum interincisal opening is measured at 50 mm from the maxillary edentulous alveolar ridge to the incisal edge of tooth #24. INTRAORAL EXAM:  The patient has normal saliva. DENTITION:  The patient is missing tooth numbers 1, 2, 6-9, 11, 15, 16, 17-21, and 28 through 32. PERIODONTAL:  The patient has chronic periodontitis with plaque accumulations, selective areas gingival recession, and incipient to moderate bone loss. Tooth #10 has plus mobility. DENTAL CARIES/SUBOPTIMAL RESTORATIONS:  Patient has a suboptimal dental restoration on #10 on the distal and #27 on the facial and possible mesial caries on tooth #27. ENDODONTIC:  Patient currently denies acute pulpitis symptoms. I do not see any evidence of periapical pathology. CROWN AND BRIDGE:  There are no crown or bridge restorations.The patient ideally could be evaluated for multiple crown restorations secondary to the presence of large dental restorations. PROSTHODONTIC:  The patient has upper and lower acrylic  partial dentures that are clinically acceptable. The patient indicates that he does not wear the partial dentures that often. OCCLUSION:  The patient has a poor occlusal scheme but a stable occlusion.  RADIOGRAPHIC INTERPRETATION: Orthopantogram was taken and supplemented with 8 periapical radiographs. There are multiple missing teeth. There is incipient to moderate bone loss noted.Multiple dental resin restorations are noted some of which are suboptimal. There may be incipient dental caries on the mesial of #27.   ASSESSMENTS: 1. Laryngeal cancer 2. Pre-chemoradiation therapy dental protocol 3. Chronic periodontitis with bone loss 4. Gingival recession 5. Accretions--minimal 6. Tooth mobility 7. Multiple missing teeth 8. Clinically acceptable maxillary and mandibular acrylic partial dentures 9. Poor occlusal scheme but a stable occlusion 10. Multiple suboptimal dental restorations 11. Dental  caries   PLAN/RECOMMENDATIONS: 1. I discussed the risks, benefits, and complications of various treatment options with the patient in relationship to his medical and dental conditions, anticipated chemoradiation therapy, and chemoradiation therapy side effects to include xerostomia, radiation caries, trismus, mucositis, taste changes, gum and jawbone changes, and risk for infection and osteoradionecrosis.  We discussed various treatment options to include no treatment, multiple extractions of teeth in the primary field of radiation therapy, alveoloplasty, pre-prosthetic surgery as indicated, periodontal therapy, dental restorations, root canal therapy, crown and bridge therapy, implant therapy, and replacement of missing teeth as indicated. According to discussion with Dr. Quitman Livings there are no teeth in the primary field of radiation therapy and therefore no extractions are required at this time. The patient currently wishes to defer any dental treatment at this time and will continue to follow up with Dr. Mina Marble for his dental care and reevaluation of resin restorations on tooth numbers 10 and 27 and possible mesial caries on tooth #27. We discussed fabrication of fluoride trays and the patient currently wishes to proceed with use of PreviDent 5000 toothpaste at bedtime instead of using fluoride in fluoride trays.  The patient, therefore, is cleared for chemoradiation therapy at this time.  2. Discussion of findings with medical team and coordination of future medical and dental care as needed.  I spent in excess of  120 minutes during the conduct of this consultation and >50% of this time involved direct face-to-face encounter for counseling and/or coordination of the patient's care.    Lenn Cal, DDS

## 2017-10-06 NOTE — Patient Instructions (Signed)

## 2017-10-10 ENCOUNTER — Inpatient Hospital Stay (HOSPITAL_BASED_OUTPATIENT_CLINIC_OR_DEPARTMENT_OTHER): Payer: Medicaid Other | Admitting: Hematology

## 2017-10-10 ENCOUNTER — Other Ambulatory Visit: Payer: Self-pay

## 2017-10-10 ENCOUNTER — Encounter (HOSPITAL_COMMUNITY): Payer: Self-pay | Admitting: Hematology

## 2017-10-10 DIAGNOSIS — F17210 Nicotine dependence, cigarettes, uncomplicated: Secondary | ICD-10-CM

## 2017-10-10 DIAGNOSIS — F1021 Alcohol dependence, in remission: Secondary | ICD-10-CM

## 2017-10-10 DIAGNOSIS — I10 Essential (primary) hypertension: Secondary | ICD-10-CM | POA: Diagnosis not present

## 2017-10-10 DIAGNOSIS — C329 Malignant neoplasm of larynx, unspecified: Secondary | ICD-10-CM | POA: Diagnosis not present

## 2017-10-10 MED ORDER — PROCHLORPERAZINE MALEATE 10 MG PO TABS
10.0000 mg | ORAL_TABLET | Freq: Four times a day (QID) | ORAL | 1 refills | Status: DC | PRN
Start: 2017-10-10 — End: 2017-10-17

## 2017-10-10 MED ORDER — ONDANSETRON HCL 8 MG PO TABS: 8.0000 mg | ORAL_TABLET | Freq: Two times a day (BID) | ORAL | 1 refills | Status: DC | PRN

## 2017-10-10 MED ORDER — LIDOCAINE-PRILOCAINE 2.5-2.5 % EX CREA: TOPICAL_CREAM | CUTANEOUS | 3 refills | Status: DC

## 2017-10-10 NOTE — Patient Instructions (Signed)
[facility] Discharge Instructions  Today you saw Dr. Higgs.    Thank you for choosing [facility] at [facility] to provide your oncology and hematology care.  To afford each patient quality time with our provider, please arrive at least 15 minutes before your scheduled appointment time.   If you have a lab appointment with the Cancer Center please come in thru the  Main Entrance and check in at the main information desk  You need to re-schedule your appointment should you arrive 10 or more minutes late.  We strive to give you quality time with our providers, and arriving late affects you and other patients whose appointments are after yours.  Also, if you no show three or more times for appointments you may be dismissed from the clinic at the providers discretion.     Again, thank you for choosing [facility].  Our hope is that these requests will decrease the amount of time that you wait before being seen by our physicians.       _____________________________________________________________  Should you have questions after your visit to [facility], please contact our office at [phone] between the hours of 8:30 a.m. and 4:30 p.m.  Voicemails left after 4:30 p.m. will not be returned until the following business day.  For prescription refill requests, have your pharmacy contact our office.       Resources For Cancer Patients and their Caregivers ? American Cancer Society: Can assist with transportation, wigs, general needs, runs Look Good Feel Better.        1-888-227-6333 ? Cancer Care: Provides financial assistance, online support groups, medication/co-pay assistance.  1-800-813-HOPE (4673) ? [facility] Assists [place] cancer patients and their families through emotional , educational and financial support.  336-427-4357 ? [place] DSS Where to apply for  food stamps, Medicaid and utility assistance. 336-342-1394 ? [facility]: Transportation to medical appointments. 336-347-2287 ? Social Security Administration: May apply for disability if have a Stage IV cancer. 336-342-7796 1-800-772-1213 ? [place] Aging, Disability and Transit Services: Assists with nutrition, care and transit needs. 336-349-2343  Cancer Center Support Programs:   > Cancer Support Group  2nd Tuesday of the month 1pm-2pm, Journey Room   > Creative Journey  3rd Tuesday of the month 1130am-1pm, Journey Room    

## 2017-10-10 NOTE — Assessment & Plan Note (Signed)
1.  Stage IV a (cT1a, cN2b, M0) laryngeal cancer: We discussed the findings on the PET/CT scan which did not show any evidence of distant metastatic disease.  Left-sided level 2 and level 3 lymph nodes were seen.  He is having a simulation CT scan tomorrow.  We discussed chemotherapy options including standard dose cisplatin (100 mg per M square) given to 3 weeks apart for 3 cycles.  However given the high toxicity of the regimen and poor tolerability, I have recommended weekly cisplatin (40 mg/m) for 6-[redacted] weeks along with radiation therapy.  We talked about the side effects including but not limited to alopecia, bone marrow suppression, nephrotoxicity, ototoxicity, peripheral neuropathy, rare chance of infections, mucositis among others.  He understands and gives [place] permission to proceed with the treatment.  He will need a port placement for chemotherapy administration.  He has some transportation problems as he does not drive.  He is planning to recruit some of his family members to drive him down to [facility] for radiation.  He is also planning to tap in public transportation.  He will be scheduled for chemotherapy education with our nurse.  He was told to drink at least 2-3 L of water during treatment.

## 2017-10-10 NOTE — Patient Instructions (Signed)
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  You are going to have chemotherapy and radiation concurrently (at the same time).  The chemotherapy we are going to treat you with is called Cisplatin.  You will have 7 weekly treatments of Cisplatin.  You will have 2 hours of IV fluids given prior to receiving Cisplatin and 2 hours of fluids given after Cisplatin.  You will have to urinate 200 mL prior to being treated with Cisplatin.  This will help protect your kidneys.  You will see the doctor regularly throughout treatment.  We monitor your lab work prior to every treatment.  The doctor monitors your response to treatment by the way you are feeling, your blood work, and scans periodically.  There will wait times while you are here for treatment.  It will take about 30 minutes to 1 hour for lab work to come back.  Then pharmacy has to mix your medications.    You will receive the following premedications prior to receiving chemotherapy: Premeds: Aloxi - high powered nausea/vomiting prevention medication used for chemotherapy patients. Emend - high powered nausea/vomiting prevention medication used for chemotherapy patients. Dexamethasone - steroid - given to reduce the risk of you having an allergic type reaction to the chemotherapy. Dex can cause you to feel energized, nervous/anxious/jittery, make you have trouble sleeping, and/or make you feel hot/flushed in the face/neck and/or look pink/red in the face/neck. These side effects will pass as the Dex wears off. (takes 20 minutes to infuse)       POTENTIAL SIDE EFFECTS OF TREATMENT:  Cisplatin (Generic Name) Other Names: Platinol, platinum  About This Drug Cisplatin is a drug used to treat cancer. This drug is given in the vein (IV).  This drug takes 1 hour to infuse.  Possible Side Effects (More Common) . This drug may affect how your kidneys work. Your  kidney function will be checked as needed. . Electrolyte changes. Your blood will be checked for electrolyte changes as needed. . High-frequency hearing loss may occur. You will get IV fluids before and during the Cisplatin infusion to help prevent this. You may also get ringing in the ears. . Bone marrow depression. This is a decrease in the number of white blood cells, red blood cells, and platelets. This may raise your risk of infection, make you tired and weak (fatigue), and raise your risk of bleeding. . Nausea and throwing up (vomiting). These symptoms may happen within a few hours after your treatment and may last for a few days to a week. Medicines are available to stop or lessen these side effects.  Possible Side Effects (Less Common) . Effects on the nerves are called peripheral neuropathy. You may feel numbness or pain in your hands and feet. It may be hard for you to button your clothes, open jars, or walk as usual. The effect on the nerves may get worse with more doses of the drug. These effects get better in some people after the drug is stopped, but it does not get better in all people. Marland Kitchen Blurred vision or other changes in eyesight. . Soreness of the mouth and throat. You may have red areas, white patches, or sores that hurt. . Hair loss. You may notice your hair getting thin. Some patients lose their hair. Your hair often grows back when treatment is done.    Allergic Reactions Allergic reactions to this drug are rare, but  may happen in some patients. Signs of allergic reactions to this drug may be a rash, fever, chills, feeling dizzy, trouble breathing, and/or feeling that your heart is beating in a fast or not normal way.  Treating Side Effects . Drink 6-8 cups of fluids each day unless your doctor has told you to limit your fluid intake due to some other health problem. A cup is 8 ounces of fluid. If you throw up or have loose bowel movements you should drink more fluids so that  you do not become dehydrated (lack water in the body due to losing too much fluid). . If you have numbness and tingling in your hands and feet, be careful when cooking, walking, and handling sharp objects and hot liquids. . Mouth care is very important. Your mouth care should consist of routine, gentle cleaning of your teeth or dentures and rinsing your mouth with a mixture of 1/2 teaspoon of salt in 8 ounces of water or  teaspoon of baking soda in 8 ounces of water. This should be done at least after each meal and at bedtime. . If you have mouth sores, avoid mouthwash that has alcohol. Also avoid alcohol and smoking because they can bother your mouth and throat. . Talk with your nurse about getting a wig before you lose your hair. Also, call the [place] at 800-ACS-2345 to find out information about the "Look Good, Feel Better" program close to where you live. It is a free program where women getting chemotherapy can learn about wigs, turbans and scarves as well as makeup techniques and skin and nail care.  Food and Drug Interactions There are no known interactions of Cisplatin with food. This drug may interact with other medicines. Tell your doctor and pharmacist about all the medicines and dietary supplements (vitamins, minerals, herbs and others) that you are taking at this time. The safety and use of dietary supplements and alternative diets are often not known. Using these might affect your cancer or interfere with your treatment. Until more is known, you should not use dietary supplements or alternative diets without your cancer doctor's help.  When to Call the Doctor Call your doctor or nurse right away if you have any of these symptoms: . Rash or itching . Feeling dizzy or lightheaded . Wheezing or trouble breathing . Swelling of the face . Fever of 100.5 F (38 C) or above . Chills . Easy bleeding or bruising . Decreased urine . Weight gain of 5 pounds in one week (fluid  retention) . Nausea that stops you from eating or drinking . Throwing up more than 3 times a day Call your doctor or nurse as soon as possible if you have these symptoms: . Numbness, tingling, decreased feeling or weakness in fingers, toes, arms, or legs . Trouble walking or changes in the way you walk, feeling clumsy when buttoning clothes, opening jars, or other routine hand motions . Blurred vision or other changes in eyesight . Changes in hearing, ringing in the ears . Pain in your mouth or throat that makes it hard to eat or drink . Fatigue that interferes with your daily activities  Sexual Problems and Reproductive Concerns . Infertility warning: Sexual problems and reproduction concerns may occur. In both men and women, this drug may affect your ability to have children. This cannot be determined before your treatment. Speak with your doctor or nurse if you plan to have children. Ask for information on sperm or egg banking. . In  men, this drug may interfere with your ability to make sperm, but it should not change your ability to have sexual relations. . In women, menstrual bleeding may become irregular or stop while you are receiving this drug. Do not assume that you cannot become pregnant if you do not have a menstrual period. . Women may experience signs of menopause like vaginal dryness, itching, and pain during sexual relations . Genetic counseling is available for you to talk about the effects of this drug therapy on future pregnancies. Also, a genetic counselor can look at the possible risk of problems in the unborn baby due to this medicine if an exposure happens during pregnancy. . Pregnancy warning: The drug may have harmful effects on the unborn child, so effective methods of birth control should be used during your cancer treatment. . Breast feeding warning: Women should not breast feed during treatment because this drug could enter the breast milk and badly harm a breast  feeding baby.    SELF CARE ACTIVITIES WHILE ON CHEMOTHERAPY: Hydration Increase your fluid intake 48 hours prior to treatment and drink at least 8 to 12 cups (64 ounces) of water/decaff beverages per day after treatment. You can still have your cup of coffee or soda but these beverages do not count as part of your 8 to 12 cups that you need to drink daily. No alcohol intake.  Medications Continue taking your normal prescription medication as prescribed.  If you start any new herbal or new supplements please let us know first to make sure it is safe.  Mouth Care Have teeth cleaned professionally before starting treatment. Keep dentures and partial plates clean. Use soft toothbrush and do not use mouthwashes that contain alcohol. Biotene is a good mouthwash that is available at most pharmacies or may be ordered by calling [phone]. Use warm salt water gargles (1 teaspoon salt per 1 quart warm water) before and after meals and at bedtime. Or you may rinse with 2 tablespoons of three-percent hydrogen peroxide mixed in eight ounces of water. If you are still having problems with your mouth or sores in your mouth please call the clinic. If you need dental work, please let the doctor know before you go for your appointment so that we can coordinate the best possible time for you in regards to your chemo regimen. You need to also let your dentist know that you are actively taking chemo. We may need to do labs prior to your dental appointment.  Skin Care Always use sunscreen that has not expired and with SPF (Sun Protection Factor) of 50 or higher. Wear hats to protect your head from the sun. Remember to use sunscreen on your hands, ears, face, & feet.  Use good moisturizing lotions such as udder cream, eucerin, or even Vaseline. Some chemotherapies can cause dry skin, color changes in your skin and nails.    . Avoid long, hot showers or baths. . Use gentle, fragrance-free soaps and laundry  detergent. . Use moisturizers, preferably creams or ointments rather than lotions because the thicker consistency is better at preventing skin dehydration. Apply the cream or ointment within 15 minutes of showering. Reapply moisturizer at night, and moisturize your hands every time after you wash them.  Hair Loss (if your doctor says your hair will fall out)  . If your doctor says that your hair is likely to fall out, decide before you begin chemo whether you want to wear a wig. You may want to shop before  treatment to match your hair color. . Hats, turbans, and scarves can also camouflage hair loss, although some people prefer to leave their heads uncovered. If you go bare-headed outdoors, be sure to use sunscreen on your scalp. . Cut your hair short. It eases the inconvenience of shedding lots of hair, but it also can reduce the emotional impact of watching your hair fall out. . Don't perm or color your hair during chemotherapy. Those chemical treatments are already damaging to hair and can enhance hair loss. Once your chemo treatments are done and your hair has grown back, it's OK to resume dyeing or perming hair. With chemotherapy, hair loss is almost always temporary. But when it grows back, it may be a different color or texture. In older adults who still had hair color before chemotherapy, the new growth may be completely gray.  Often, new hair is very fine and soft. Infection Prevention Please wash your hands for at least 30 seconds using warm soapy water. Handwashing is the #1 way to prevent the spread of germs. Stay away from sick people or people who are getting over a cold. If you develop respiratory systems such as green/yellow mucus production or productive cough or persistent cough let us know and we will see if you need an antibiotic. It is a good idea to keep a pair of gloves on when going into grocery stores/[employer] to decrease your risk of coming into contact with germs on the carts,  etc. Carry alcohol hand gel with you at all times and use it frequently if out in public. If your temperature reaches 100.5 or higher please call the clinic and let us know.  If it is after hours or on the weekend please go to the ER if your temperature is over 100.5.  Please have your own personal thermometer at home to use.    Sex and bodily fluids If you are going to have sex, a condom must be used to protect the person that isn't taking chemotherapy. Chemo can decrease your libido (sex drive). For a few days after chemotherapy, chemotherapy can be excreted through your bodily fluids.  When using the toilet please close the lid and flush the toilet twice.  Do this for a few day after you have had chemotherapy.   Effects of chemotherapy on your sex life Some changes are simple and won't last long. They won't affect your sex life permanently. Sometimes you may feel: . too tired . not strong enough to be very active . sick or sore  . not in the mood . anxious or low Your anxiety might not seem related to sex. For example, you may be worried about the cancer and how your treatment is going. Or you may be worried about money, or about how you family are coping with your illness. These things can cause stress, which can affect your interest in sex. It's important to talk to your partner about how you feel. Remember - the changes to your sex life don't usually last long. There's usually no medical reason to stop having sex during chemo. The drugs won't have any long term physical effects on your performance or enjoyment of sex. Cancer can't be passed on to your partner during sex  Contraception It's important to use reliable contraception during treatment. Avoid getting pregnant while you or your partner are having chemotherapy. This is because the drugs may harm the baby. Sometimes chemotherapy drugs can leave a man or woman infertile.  This means  you would not be able to have children in the future.  You might want to talk to someone about permanent infertility. It can be very difficult to learn that you may no longer be able to have children. Some people find counselling helpful. There might be ways to preserve your fertility, although this is easier for men than for women. You may want to speak to a fertility expert. You can talk about sperm banking or harvesting your eggs. You can also ask about other fertility options, such as donor eggs. If you have or have had breast cancer, your doctor might advise you not to take the contraceptive pill. This is because the hormones in it might affect the cancer.  It is not known for sure whether or not chemotherapy drugs can be passed on through semen or secretions from the vagina. Because of this some doctors advise people to use a barrier method if you have sex during treatment. This applies to vaginal, anal or oral sex. Generally, doctors advise a barrier method only for the time you are actually having the treatment and for about a week after your treatment. Advice like this can be worrying, but this does not mean that you have to avoid being intimate with your partner. You can still have close contact with your partner and continue to enjoy sex.  Animals If you have cats or birds we just ask that you not change the litter or change the cage.  Please have someone else do this for you while you are on chemotherapy.   Food Safety During and After Cancer Treatment Food safety is important for people both during and after cancer treatment. Cancer and cancer treatments, such as chemotherapy, radiation therapy, and stem cell/bone marrow transplantation, often weaken the immune system. This makes it harder for your body to protect itself from foodborne illness, also called food poisoning. Foodborne illness is caused by eating food that contains harmful bacteria, parasites, or viruses.  Foods to avoid Some foods have a higher risk of becoming tainted with  bacteria. These include: Marland Kitchen Unwashed fresh fruit and vegetables, especially leafy vegetables that can hide dirt and other contaminants . Raw sprouts, such as alfalfa sprouts . Raw or undercooked beef, especially ground beef, or other raw or undercooked meat and poultry . Fatty, fried, or spicy foods immediately before or after treatment.  These can sit heavy on your stomach and make you feel nauseous. . Raw or undercooked shellfish, such as oysters. . Sushi and sashimi, which often contain raw fish.  . Unpasteurized beverages, such as unpasteurized fruit juices, raw milk, raw yogurt, or cider . Undercooked eggs, such as soft boiled, over easy, and poached; raw, unpasteurized eggs; or foods made with raw egg, such as homemade raw cookie dough and homemade mayonnaise Simple steps for food safety Shop smart. . Do not buy food stored or displayed in an unclean area. . Do not buy bruised or damaged fruits or vegetables. . Do not buy cans that have cracks, dents, or bulges. . Pick up foods that can spoil at the end of your shopping trip and store them in a cooler on the way home. Prepare and clean up foods carefully. . Rinse all fresh fruits and vegetables under running water, and dry them with a clean towel or paper towel. . Clean the top of cans before opening them. . After preparing food, wash your hands for 20 seconds with hot water and soap. Pay special attention to areas between fingers and  under nails. . Clean your utensils and dishes with hot water and soap. Marland Kitchen Disinfect your kitchen and cutting boards using 1 teaspoon of liquid, unscented bleach mixed into 1 quart of water.   Dispose of old food. . Eat canned and packaged food before its expiration date (the "use by" or "best before" date). . Consume refrigerated leftovers within 3 to 4 days. After that time, throw out the food. Even if the food does not smell or look spoiled, it still may be unsafe. Some bacteria, such as Listeria, can grow  even on foods stored in the refrigerator if they are kept for too long. Take precautions when eating out. . At restaurants, avoid buffets and salad bars where food sits out for a long time and comes in contact with many people. Food can become contaminated when someone with a virus, often a norovirus, or another "bug" handles it. . Put any leftover food in a "to-go" container yourself, rather than having the server do it. And, refrigerate leftovers as soon as you get home. . Choose restaurants that are clean and that are willing to prepare your food as you order it cooked.    MEDICATIONS: Zofran/Ondansetron 8mg  tablet. Take 1 tablet (8 mg total) by mouth 2 (two) times daily as needed. Start on the third day after chemotherapy. (#1 nausea med to take, this can constipate)  Compazine/Prochlorperazine 10mg  tablet. Take 1 tablet every 6 hours as needed for nausea/vomiting. (#2 nausea med to take, this can make you sleepy)  EMLA cream. Apply a quarter size amount to port site 1 hour prior to chemo. Do not rub in. Cover with plastic wrap.      Over-the-Counter Meds: Miralax 1 capful in 8 oz of fluid daily. May increase to two times a day if needed. This is a stool softener. If this doesn't work proceed you can add:  Senokot S-start with 1 tablet two times a day and increase to 4 tablets two times a day if needed. (total of 8 tablets in a 24 hour period). This is a stimulant laxative.   Call us if this does not help your bowels move.   Imodium 2mg  capsule. Take 2 capsules after the 1st loose stool and then 1 capsule every 2 hours until you go a total of 12 hours without having a loose stool. Call the [facility] if loose stools continue. If diarrhea occurs @ bedtime, take 2 capsules @ bedtime. Then take 2 capsules every 4 hours until morning. Call [facility] [facility].    Diarrhea Sheet  If you are having loose stools/diarrhea, please purchase Imodium and begin taking as outlined:  At the first  sign of poorly formed or loose stools you should begin taking Imodium(loperamide) 2 mg capsules.  Take two caplets (4mg ) followed by one caplet (2mg ) every 2 hours until you have had no diarrhea for 12 hours.  During the night take two caplets (4mg ) at bedtime and continue every 4 hours during the night until the morning.  Stop taking Imodium only after there is no sign of diarrhea for 12 hours.    Always call the [facility] if you are having loose stools/diarrhea that you can't get under control.  Loose stools/diarrhea leads to dehydration (loss of water) in your body.  We have other options of trying to get the loose stools/diarrhea to stopped but you must let us know!       Constipation Sheet *Miralax in 8 oz of fluid daily.  May increase to  two times a day if needed.  This is a stool softener.  If this not enough to keep your bowel regular:  You can add:  *Senokot S, start with one tablet twice a day and can increase to 4 tablets twice a day if needed.  This is a stimulant laxative.   Sometimes when you take pain medication you need BOTH a medicine to keep your stool soft and a medicine to help your bowel push it out!  Please call if the above does not work for you.   Do not go more than 2 days without a bowel movement.  It is very important that you do not become constipated.  It will make you feel sick to your stomach (nausea) and can cause abdominal pain and vomiting.    Nausea Sheet  Zofran/Ondansetron 8mg  tablet. Take 1 tablet (8 mg total) by mouth 2 (two) times daily as needed. Start on the third day after chemotherapy. (#1 nausea med to take, this can constipate)  Compazine/Prochlorperazine 10mg  tablet. Take 1 tablet every 6 hours as needed for nausea/vomiting. (#2 nausea med to take, this can make you sleepy)  You can take these medications together or separately.  We would first like for you to try the Ondansetron by itself and then take the Prochloperizine if needed.  But you are allowed to take both medications at the same time if your nausea is that severe.  If you are having persistent nausea (nausea that does not stop) please take these medications on a staggered schedule so that the nausea medication stays in your body.  Please call the [facility] and let us know the amount of nausea that you are experiencing.  If you begin to vomit, you need to call the [facility] and if it is the weekend and you have vomited more than one time and cant get it to stop-go to the Emergency Room.  Persistent nausea/vomiting can lead to dehydration (loss of fluid in your body) and will make you feel terrible.   Ice chips, sips of clear liquids, foods that are @ room temperature, crackers, and toast tend to be better tolerated.   SYMPTOMS TO REPORT AS SOON AS POSSIBLE AFTER TREATMENT:  FEVER GREATER THAN 100.5 F  CHILLS WITH OR WITHOUT FEVER  NAUSEA AND VOMITING THAT IS NOT CONTROLLED WITH YOUR NAUSEA MEDICATION  UNUSUAL SHORTNESS OF BREATH  UNUSUAL BRUISING OR BLEEDING  TENDERNESS IN MOUTH AND THROAT WITH OR WITHOUT PRESENCE OF ULCERS  URINARY PROBLEMS  BOWEL PROBLEMS  UNUSUAL RASH    Wear comfortable clothing and clothing appropriate for easy access to any Portacath or PICC line. Let us know if there is anything that we can do to make your therapy better!    What to do if you need assistance after hours or on the weekends:  CALL [phone].  HOLD on the line, do not hang up.  You will hear multiple messages but at the end you will be connected with a nurse triage line.  They will contact the doctor if necessary.  Most of the time they will be able to assist you.  Do not call the hospital operator.     I have been informed and understand all of the instructions given to me and have received a copy. I have been instructed to call the clinic [phone] or my family physician as soon as possible for continued medical care, if indicated. I do not have  any more questions at this time  but understand that I may call the [facility] or the Patient Navigator at [phone] during office hours should I have questions or need assistance in obtaining follow-up care.

## 2017-10-10 NOTE — Progress Notes (Signed)
START ON PATHWAY REGIMEN - Head and Neck     Administer weekly:     Cisplatin   **Always confirm dose/schedule in your pharmacy ordering system**    Patient Characteristics: Larynx, Stage III, IVA; Resectable, Primary Chemoradiation Disease Classification: Larynx Current Disease Status: No Distant Metastases and No Recurrent Disease AJCC T Category: T1 AJCC 8 Stage Grouping: IVA AJCC N Category: cN2b AJCC M Category: M0 Intent of Therapy: Curative Intent, Discussed with Patient

## 2017-10-10 NOTE — Progress Notes (Signed)
Met with pt face to face.  Introduced myself and explain a little bit about my role as the patient navigator.  Pt given a card with all my information on it.  Told pt to call if they had any questions or concerns.  Pt verbalized understanding.

## 2017-10-10 NOTE — Progress Notes (Signed)
[place], [place] [address]   CLINIC:  Medical Oncology/Hematology  PCP:  [facility], [place] [address] [phone]   REASON FOR VISIT:  Follow-up for laryngeal cancer.  CURRENT THERAPY: Weekly cisplatin to start in 1-2 weeks.  BRIEF ONCOLOGIC HISTORY:    Laryngeal cancer ([facility])   09/29/2017 Initial Diagnosis    Laryngeal cancer ([facility])      10/23/2017 -  Chemotherapy    The patient had PALONOSETRON HCL INJECTION 0.25 MG/5ML, 0.25 mg, Intravenous,  Once, 0 of 7 cycles CISplatin (PLATINOL) 85 mg in sodium chloride 0.9 % 250 mL chemo infusion, 40 mg/m2, Intravenous,  Once, 0 of 7 cycles FOSAPREPITANT 150MG  + DEXAMETHASONE INFUSION [facility], , Intravenous,  Once, 0 of 7 cycles  for chemotherapy treatment.         CANCER STAGING: Cancer Staging Laryngeal cancer [facility]) Staging form: Larynx - Glottis, AJCC 8th Edition - Clinical stage from 09/29/2017: Stage IVA (cT1a, cN2a, cM0) - Signed by Derek Jack, MD on 09/29/2017    INTERVAL HISTORY:  Timothy Parks 57 y.o. male returns for follow-up of PET/CT scan results.  He was evaluated by dentist, who recommended no more extractions.  He also has nutritional evaluation pending.  And he has a simulation CT scan tomorrow.  He is eager to get started with treatments.  He is accompanied by his sister today.  REVIEW OF SYSTEMS:  Review of Systems  Constitutional: Negative.   HENT:  Negative.   Respiratory: Negative.   Cardiovascular: Negative.   Gastrointestinal: Negative.   Genitourinary: Negative.    Musculoskeletal: Negative.   Skin: Negative.   Neurological: Negative.   Psychiatric/Behavioral: Negative.      PAST MEDICAL/SURGICAL HISTORY:  Past Medical History:  Diagnosis Date  . Anxiety   . Hypertension   . 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 BLADDER SURGERY    . MICROLARYNGOSCOPY N/A  09/20/2017   Procedure: MICRO LARYNGOSCOPY WITH BIOPSY OF LARYNGEAL MASS;  Surgeon: Leta Baptist, MD;  Location: [facility];  Service: ENT;  Laterality: N/A;     SOCIAL HISTORY:  Social History   Socioeconomic History  . Marital status: Single    Spouse name: Not on file  . Number of children: Not on file  . Years of education: Not on file  . Highest education level: Not on file  Occupational History  . Not on file  Social Needs  . Financial resource strain: Not on file  . Food insecurity:    Worry: Not on file    Inability: Not on file  . Transportation needs:    Medical: Not on file    Non-medical: Not on file  Tobacco Use  . Smoking status: Current Some Day Smoker    Packs/day: 0.00    Types: Cigarettes  . Smokeless tobacco: Current User    Types: Snuff  . Tobacco comment: 2 cigarettes with coffee in the morning  Substance and Sexual Activity  . Alcohol use: No    Frequency: Never    Comment: in recovery x since 2013  . Drug use: No  . Sexual activity: Not Currently  Lifestyle  . Physical activity:    Days per week: Not on file    Minutes per session: Not on file  . Stress: Not on file  Relationships  . Social connections:    Talks on phone: Not on file  Gets together: Not on file    Attends religious service: Not on file    Active member of club or organization: Not on file    Attends meetings of clubs or organizations: Not on file    Relationship status: Not on file  . Intimate partner violence:    Fear of current or ex partner: Not on file    Emotionally abused: Not on file    Physically abused: Not on file    Forced sexual activity: Not on file  Other Topics Concern  . Not on file  Social History Narrative  . Not on file    FAMILY HISTORY:  Family History  Problem Relation Age of Onset  . Bladder Cancer Mother        37  . Colon cancer Father   . Thyroid disease Sister   . Heart disease Brother        stent placement  . Heart  disease Maternal Grandmother   . Diabetes Maternal Grandfather   . Cancer Paternal Grandmother   . AAA (abdominal aortic aneurysm) Paternal Grandfather     CURRENT MEDICATIONS:  Outpatient Encounter Medications as of 10/10/2017  Medication Sig  . amLODipine (NORVASC) 10 MG tablet Take 10 mg by mouth daily.  Marland Kitchen FLUoxetine (PROZAC) 20 MG tablet Take 20 mg by mouth daily.  Marland Kitchen lovastatin (MEVACOR) 20 MG tablet Take 20 mg by mouth at bedtime.  . sodium fluoride (PREVIDENT 5000 PLUS) 1.1 % CREA dental cream Apply cream to tooth brush. Brush teeth for 2 minutes. Spit out excess. Repeat nightly.   No facility-administered encounter medications on file as of 10/10/2017.     ALLERGIES:  Allergies  Allergen Reactions  . Tetracyclines & Related Anxiety     PHYSICAL EXAM:  ECOG Performance status:1  Vitals:   10/10/17 1446  BP: 123/85  Pulse: 87  Resp: 20  Temp: 98.5 F (36.9 C)  SpO2: 99%   Filed Weights   10/10/17 1446  Weight: 207 lb (93.9 kg)    Physical Exam  Constitutional: He is well-developed, well-nourished, and in no distress.  Cardiovascular: Normal rate, regular rhythm and normal heart sounds.  Pulmonary/Chest: Effort normal and breath sounds normal.     LABORATORY DATA:  I have reviewed the labs as listed.  CBC No results found for: WBC, RBC, HGB, HCT, PLT, MCV, MCH, MCHC, RDW, LYMPHSABS, MONOABS, EOSABS, BASOSABS No flowsheet data found.     DIAGNOSTIC IMAGING:  I have reviewed PET/CT scan and agree with the report findings.  It showed 2 level 2 lymph nodes which are hypermetabolic.    ASSESSMENT & PLAN:   Laryngeal cancer (HCC) 1.  Stage IV a (cT1a, cN2b, M0) laryngeal cancer: We discussed the findings on the PET/CT scan which did not show any evidence of distant metastatic disease.  Left-sided level 2 and level 3 lymph nodes were seen.  He is having a simulation CT scan tomorrow.  We discussed chemotherapy options including standard dose cisplatin (100  mg per M square) given to 3 weeks apart for 3 cycles.  However given the high toxicity of the regimen and poor tolerability, I have recommended weekly cisplatin (40 mg/m) for 6-[redacted] weeks along with radiation therapy.  We talked about the side effects including but not limited to alopecia, bone marrow suppression, nephrotoxicity, ototoxicity, peripheral neuropathy, rare chance of infections, mucositis among others.  He understands and gives [place] permission to proceed with the treatment.  He will need a port placement for chemotherapy  administration.  He has some transportation problems as he does not drive.  He is planning to recruit some of his family members to drive him down to [facility] for radiation.  He is also planning to tap in public transportation.  He will be scheduled for chemotherapy education with our nurse.  He was told to drink at least 2-3 L of water during treatment.  We will evaluate him on a weekly basis once he starts treatments.  Total time spent is 25 minutes with more than 50% of the time spent face-to-face discussing treatment plan, side effects and coordination of care.    Derek Jack, MD [place] [phone]

## 2017-10-11 ENCOUNTER — Other Ambulatory Visit: Payer: Self-pay

## 2017-10-11 ENCOUNTER — Encounter (HOSPITAL_COMMUNITY): Payer: Self-pay | Admitting: Speech Pathology

## 2017-10-11 ENCOUNTER — Ambulatory Visit (HOSPITAL_COMMUNITY): Payer: Medicaid Other | Attending: Hematology | Admitting: Speech Pathology

## 2017-10-11 ENCOUNTER — Ambulatory Visit: Payer: Self-pay | Admitting: General Surgery

## 2017-10-11 ENCOUNTER — Encounter (HOSPITAL_COMMUNITY): Payer: Self-pay | Admitting: Emergency Medicine

## 2017-10-11 DIAGNOSIS — R1312 Dysphagia, oropharyngeal phase: Secondary | ICD-10-CM | POA: Insufficient documentation

## 2017-10-11 NOTE — Progress Notes (Signed)
Chemotherapy teaching pulled together and appts made. Appts faxed to [place].

## 2017-10-11 NOTE — Therapy (Signed)
[place], [place], [address] Phone: [phone]   Fax:  [phone]  Speech Language Pathology Evaluation  Patient Details  Name: Timothy Parks MRN: [id number] Date of Birth: Mar 16, 1961 No data recorded  Encounter Date: 10/11/2017  End of Session - 10/11/17 1726    Visit Number  1    Authorization Type  Medicaid    SLP Start Time  1430    SLP Stop Time   3976    SLP Time Calculation (min)  45 min       Past Medical History:  Diagnosis Date  . Anxiety   . Hypertension   . 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 BLADDER SURGERY    . MICROLARYNGOSCOPY N/A 09/20/2017   Procedure: MICRO LARYNGOSCOPY WITH BIOPSY OF LARYNGEAL MASS;  Surgeon: Leta Baptist, MD;  Location: [place];  Service: ENT;  Laterality: N/A;    There were no vitals filed for this visit.  Subjective Assessment - 10/11/17 1721    Subjective  "I don't have any trouble swallowing."    Patient is accompained by:  Family member Sister, Timothy Parks    Currently in Pain?  No/denies       Prior Functional Status - 10/11/17 1722      Prior Functional Status   Cognitive/Linguistic Baseline  Within functional limits    Type of Home  Mobile home     Lives With  Alone    Vocation  On disability      General - 10/11/17 1724      General Information   Date of Onset  09/20/17    HPI  Timothy Parks [id number] y.o.maleis here because of newly diagnosed laryngeal cancer Clinical stage from 09/29/2017: Stage IVA (cT1a, cN2a, cM0). He noticed a sore throat and left neck swelling about 4 months ago. He was evaluated by his PCP and was referred to Dr. Benjamine Mola. A laryngoscopic exam showed 1 cm fungating mass attached to the left arytenoid. CT neck was done on 09/27/2017 which showed a left level 2 lymph node measuring 2.3 x 2.4 x 3.2 cm. He underwent a [employer] laryngoscopy with  biopsy on 09/20/2017. He denies any difficulty swallowing. He had slight pain while swallowing which has improved since biopsy. No weight loss reported. He was a heavy drinker and heavy smoker, but quit drinking 5 years ago. He is currently smoking 2-3 cigarettes/day for the last few years. Prior to that he smoked about 3 packs/day. He is currently on disability but worked for [employer]. He lives by himself at home, he is accompanied today by his sister, Timothy Parks. He will begin chemoradiation therapy (weekly cisplatin (40 mg/m) for 6-[redacted] weeks along with radiation therapy in early April.     Type of Study  Bedside Swallow Evaluation    Previous Swallow Assessment  None on record.    Diet Prior to this Study  Regular;Thin liquids    Temperature Spikes Noted  No    Respiratory Status  Room air    History of Recent Intubation  No    Behavior/Cognition  Alert;Cooperative;Pleasant mood    Oral Cavity Assessment  Within Functional Limits    Oral Care Completed by SLP  No    Oral Cavity - Dentition  Adequate natural dentition;Other (Comment)    Vision  Functional for self-feeding    Self-Feeding Abilities  Able to feed self  Patient Positioning  Upright in chair    Baseline Vocal Quality  Normal;Wet    Volitional Cough  Strong    Volitional Swallow  Able to elicit       Oral Motor/Sensory Function - 10/11/17 1725      Oral Motor/Sensory Function   Overall Oral Motor/Sensory Function  Within functional limits      Ice Chips - 10/11/17 1725      Ice Chips   Ice chips  Not tested      Thin Liquid - 10/11/17 1725      Thin Liquid   Thin Liquid  Within functional limits    Presentation  Cup;Self Fed        Puree - 10/11/17 1725      Puree   Puree  Within functional limits    Presentation  Self Fed      Solid - 10/11/17 1725      Solid   Solid  Within functional limits    Presentation  Self Fed       Pt currently tolerates regular textures and thin  liquids. Oral motor assessment revealed WNL lingual ROM and WNL lingual strength. Labial ROM was WNL and strength was WNL. Velar ROM appeared WNL. POs: Pt consumed puree, thins, and regular textures without overt s/s aspiration. Thyroid elevation appeared WNL, and swallows appeared timely.   Because data states the risk for dysphagia during and after radiation treatment is high due to undergoing radiation tx, SLP taught pt about the possibility of reduced/limited ability for PO intake during rad tx. SLP encouraged pt to continue swallowing POs as far into rad tx as possible, even ingesting POs and/or completing HEP shortly after administration of pain meds.   SLP educated pt re: changes to swallowing musculature after rad tx, and why adherence to dysphagia HEP provided today and PO consumption was necessary to inhibit muscular disuse atrophy and manage the effects of radiation fibrosis following radiation tx. Further education was provided regarding possible acute and late effects of radiation therapy including: xerostomia, dysgeusia, salivary changes, mucositis/esophagitis, dehydration, weight loss, fatigue, dysphagia, trismus, and lymphedema. It is prudent that patient is followed by a dentist to reduce risk of cavities, infection, osteoradionecrosis, or other oral issues.   Pt demonstrated understanding of these things to SLP.    SLP then developed a HEP for pt and pt was instructed how to perform exercises involving lingual, vocal, and pharyngeal strengthening. SLP performed each exercise and pt return demonstrated each exercise. SLP ensured pt performance was correct prior to moving on to next exercise. Pt was instructed to complete this program 2-3 times a day, 6-7 days/week until 6 months after his or her last rad tx, then x2-3 a week after that.  Pt will demonstrate safe and efficient consumption of self regulated regular textures with thin liquids with use of strategies as needed.  Pt will  complete pharyngeal swallowing exercises as assigned with use of written cue after initial introduction/model from SLP  Pt will verbalize 3 signs/symptoms of aspiration pneumonia with min assist.  Signs and symptoms of aspiration (fever, inability to manage secretions, globus sensation, multiple swallows, congestion, shortness of breath, etc.) were reviewed with Pt and sister and they understand to notify RN if any occur.    SLP Education - 10/11/17 1722    Education provided  Yes    Education Details  Education regarding dysphagia as it relates to treatment for head and neck CA    Person(s) Educated  Patient;Other (comment)    Methods  Explanation;Handout    Comprehension  Verbalized understanding;Need further instruction       SLP Short Term Goals - 10/11/17 2001      SLP SHORT TERM GOAL #1   Title See above   Time  3    Period  Months    Status  New       Plan - 10/11/17 1726    Clinical Impression Statement See above   Potential to Achieve Goals  Good    Consulted and Agree with Plan of Care  Patient;Family member/caregiver       Patient will benefit from skilled therapeutic intervention in order to improve the following deficits and impairments:   Dysphagia, oropharyngeal phase    Problem List Patient Active Problem List   Diagnosis Date Noted  . Laryngeal cancer ([place]) 09/29/2017  . Goals of care, counseling/discussion 09/29/2017   Thank you,  Genene Churn, [place]  [facility] 10/11/2017, 8:01 PM  [facility] [address] [place], [place], [address] Phone: [phone]   Fax:  [phone]  Name: Timothy Parks MRN: [id number] Date of Birth: 1960-11-13

## 2017-10-13 ENCOUNTER — Ambulatory Visit (INDEPENDENT_AMBULATORY_CARE_PROVIDER_SITE_OTHER): Payer: Medicaid Other | Admitting: General Surgery

## 2017-10-13 ENCOUNTER — Encounter: Payer: Self-pay | Admitting: General Surgery

## 2017-10-13 VITALS — BP 161/91 | HR 86 | Temp 98.0°F | Resp 18 | Ht 67.0 in | Wt 206.0 lb

## 2017-10-13 DIAGNOSIS — C329 Malignant neoplasm of larynx, unspecified: Secondary | ICD-10-CM

## 2017-10-13 NOTE — Progress Notes (Signed)
[facility] History and Physical  Reason for Referral: Laryngeal Cancer, need port a catheter   Referring Physician:  [facility]   Chief Complaint    Cancer      Timothy Parks is a 57 y.o. male.  HPI: [name] is a 57 yo who was recently diagnosed with a laryngeal cancer. He was found to have a left neck mass, and investigation pursued and ultimately he had a laryngoscopy with Dr. 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 He has been doing fair and is to start his chemotherapy treatments and radiation treatments shortly. He has not had any issues with swallowing or breathing.  He has even seen speech therapy and reports no issue or problems and he was told to continue to swallow, and exercise his swallow during his treatments. He is otherwise relatively healthy. 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 He use to smoker significantly more when he drank.   He comes in today for port a catheter placement. He is right handed.    Past Medical History:  Diagnosis Date  . Anxiety   . Hypertension   . 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 BLADDER SURGERY    . MICROLARYNGOSCOPY N/A 09/20/2017   Procedure: MICRO LARYNGOSCOPY WITH BIOPSY OF LARYNGEAL MASS;  Surgeon: Leta Baptist, MD;  Location: [facility];  Service: ENT;  Laterality: N/A;    Family History  Problem Relation Age of Onset  . Bladder Cancer Mother        68  . Colon cancer Father   . Thyroid disease Sister   . Heart disease Brother        stent placement  . Heart disease Maternal Grandmother   . Diabetes Maternal Grandfather   . Cancer Paternal Grandmother   . AAA (abdominal aortic aneurysm) Paternal Grandfather     Social History   Tobacco Use  . Smoking status: Current Some Day Smoker    Packs/day: 0.00    Types: Cigarettes  .  Smokeless tobacco: Current User    Types: Snuff  . Tobacco comment: 2 cigarettes with coffee in the morning  Substance Use Topics  . Alcohol use: No    Frequency: Never    Comment: in recovery x since 2013  . Drug use: No    Medications: I have reviewed the patient's current medications. Allergies as of 10/13/2017      Reactions   Tetracyclines & Related Anxiety      Medication List        Accurate as of 10/13/17  1:58 PM. Always use your most recent med list.          amLODipine 10 MG tablet Commonly known as:  NORVASC Take 10 mg by mouth daily.   FLUoxetine 20 MG tablet Commonly known as:  PROZAC Take 20 mg by mouth daily.   lovastatin 20 MG tablet Commonly known as:  MEVACOR Take 20 mg by mouth at bedtime.   sodium fluoride 1.1 % Crea dental cream Commonly known as:  PREVIDENT 5000 PLUS Apply cream to tooth brush. Brush teeth for 2 minutes. Spit out excess. Repeat nightly.        ROS:  A comprehensive review of systems was negative except for: Eyes: positive for pain in his eyes Ears, nose, mouth, throat, and face: positive for sinus problems Gastrointestinal:  positive for indigestion Hematologic/lymphatic: positive for cervical lymphadenopathy on left Musculoskeletal: positive for stiff joints Endocrine: positive for tired/ sluggish  Blood pressure (!) 161/91, pulse 86, temperature 98 F (36.7 C), resp. rate 18, height 5\' 7"  (1.702 m), weight 206 lb (93.4 kg). Physical Exam  Constitutional: He is oriented to person, place, and time and well-developed, well-nourished, and in no distress.  HENT:  Head: Normocephalic.  Eyes: Pupils are equal, round, and reactive to light.  Neck: Normal range of motion.  Cardiovascular: Normal rate and regular rhythm.  Pulmonary/Chest: Effort normal and breath sounds normal.  Abdominal: Soft. He exhibits no distension. There is no tenderness.  Musculoskeletal: Normal range of motion. He exhibits no edema.    Lymphadenopathy:    He has cervical adenopathy.       Left cervical: Superficial cervical adenopathy present.  Neurological: He is alert and oriented to person, place, and time.  Skin: Skin is warm and dry.  Psychiatric: Mood, memory, affect and judgment normal.  Vitals reviewed.   Results: Personally reviewed imaging, lymph node on left cervical neck, hypermetabolic activity in left larynx, airway appears patent, no signs of compression   CT neck 09/2017  IMPRESSION: 1. Solitary left level 2 lymph node measures 2.3 x 2.4 x 3.2 cm, likely reflecting metastatic disease. 2. The study is degraded by patient motion. Asymmetric soft tissue is suspected along the left piriform sinus. Please correlate with exam. 3. Mild degenerative changes in the cervical spine.  PET 09/2017 IMPRESSION: 1. Hypermetabolic activity along the left aryepiglottic fold, left piriform sinus, and left paralaryngeal space compatible with primary malignancy. A left level IIa lymph node measuring 2.3 cm in short axis has a maximum SUV of 15.3, highly hypermetabolic and compatible with malignancy. A 0.6 cm in short axis left level III lymph node has a maximum SUV of only 2.8, minimally above blood pool, equivocal for malignant involvement. 2. No involvement of the chest, abdomen/pelvis, or skeleton identified. 3. Other imaging findings of potential clinical significance: Mild chronic bilateral maxillary sinusitis. Aortic Atherosclerosis (ICD10-I70.0). Coronary atherosclerosis. Left adrenal adenoma. Bilateral nonobstructive nephrolithiasis.  Assessment & Plan:  Timothy Parks is a 57 y.o. male with laryngeal cancer who is going to undergo chemotherapy and radiation. He is currently having no issues with swallowing or breathing.   -Port a catheter placement, will try for the left given that he is right handed   All questions were answered to the satisfaction of the patient.  The risk and benefits of  port a catheter were discussed including but not limited to bleeding, infection, risk of pneumothorax, risk of malfunction.  After careful consideration, LEGRANDE HAO has decided to proceed.    Virl Cagey 10/13/2017, 1:58 PM

## 2017-10-13 NOTE — Patient Instructions (Signed)
Implanted Port Home Guide An implanted port is a type of central line that is placed under the skin. Central lines are used to provide IV access when treatment or nutrition needs to be given through a person's veins. Implanted ports are used for long-term IV access. An implanted port may be placed because:  You need IV medicine that would be irritating to the small veins in your hands or arms.  You need long-term IV medicines, such as antibiotics.  You need IV nutrition for a long period.  You need frequent blood draws for lab tests.  You need dialysis.  Implanted ports are usually placed in the chest area, but they can also be placed in the upper arm, the abdomen, or the leg. An implanted port has two main parts:  Reservoir. The reservoir is round and will appear as a small, raised area under your skin. The reservoir is the part where a needle is inserted to give medicines or draw blood.  Catheter. The catheter is a thin, flexible tube that extends from the reservoir. The catheter is placed into a large vein. Medicine that is inserted into the reservoir goes into the catheter and then into the vein.  How will I care for my incision site? Do not get the incision site wet. Bathe or shower as directed by your health care provider. How is my port accessed? Special steps must be taken to access the port:  Before the port is accessed, a numbing cream can be placed on the skin. This helps numb the skin over the port site.  Your health care provider uses a sterile technique to access the port. ? Your health care provider must put on a mask and sterile gloves. ? The skin over your port is cleaned carefully with an antiseptic and allowed to dry. ? The port is gently pinched between sterile gloves, and a needle is inserted into the port.  Only "non-coring" port needles should be used to access the port. Once the port is accessed, a blood return should be checked. This helps ensure that the port  is in the vein and is not clogged.  If your port needs to remain accessed for a constant infusion, a clear (transparent) bandage will be placed over the needle site. The bandage and needle will need to be changed every week, or as directed by your health care provider.  Keep the bandage covering the needle clean and dry. Do not get it wet. Follow your health care provider's instructions on how to take a shower or bath while the port is accessed.  If your port does not need to stay accessed, no bandage is needed over the port.  What is flushing? Flushing helps keep the port from getting clogged. Follow your health care provider's instructions on how and when to flush the port. Ports are usually flushed with saline solution or a medicine called heparin. The need for flushing will depend on how the port is used.  If the port is used for intermittent medicines or blood draws, the port will need to be flushed: ? After medicines have been given. ? After blood has been drawn. ? As part of routine maintenance.  If a constant infusion is running, the port may not need to be flushed.  How long will my port stay implanted? The port can stay in for as long as your health care provider thinks it is needed. When it is time for the port to come out, surgery will be   done to remove it. The procedure is similar to the one performed when the port was put in. When should I seek immediate medical care? When you have an implanted port, you should seek immediate medical care if:  You notice a bad smell coming from the incision site.  You have swelling, redness, or drainage at the incision site.  You have more swelling or pain at the port site or the surrounding area.  You have a fever that is not controlled with medicine.  This information is not intended to replace advice given to you by your health care provider. Make sure you discuss any questions you have with your health care provider. Document  Released: 07/05/2005 Document Revised: 12/11/2015 Document Reviewed: 03/12/2013 Elsevier Interactive Patient Education  2017 [employer]  

## 2017-10-13 NOTE — H&P (Signed)
[facility] History and Physical  Reason for Referral: Laryngeal Cancer, need port a catheter   Referring Physician:  [facility]      Chief Complaint    Cancer      Timothy Parks is a 57 y.o. male.  HPI: Timothy Parks is a 57 yo who was recently diagnosed with a laryngeal cancer. He was found to have a left neck mass, and investigation pursued and ultimately he had a laryngoscopy with Dr. 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 He has been doing fair and is to start his chemotherapy treatments and radiation treatments shortly. He has not had any issues with swallowing or breathing.  He has even seen speech therapy and reports no issue or problems and he was told to continue to swallow, and exercise his swallow during his treatments. He is otherwise relatively healthy. 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 He use to smoker significantly more when he drank.   He comes in today for port a catheter placement. He is right handed.        Past Medical History:  Diagnosis Date  . Anxiety   . Hypertension   . Recovering alcoholic [facility])          Past Surgical History:  Procedure Laterality Date  . BLADDER SURGERY    . MICROLARYNGOSCOPY N/A 09/20/2017   Procedure: MICRO LARYNGOSCOPY WITH BIOPSY OF LARYNGEAL MASS;  Surgeon: Leta Baptist, MD;  Location: [place];  Service: ENT;  Laterality: N/A;         Family History  Problem Relation Age of Onset  . Bladder Cancer Mother        110  . Colon cancer Father   . Thyroid disease Sister   . Heart disease Brother        stent placement  . Heart disease Maternal Grandmother   . Diabetes Maternal Grandfather   . Cancer Paternal Grandmother   . AAA (abdominal aortic aneurysm) Paternal Grandfather     Social History        Tobacco Use  . Smoking status: Current Some Day  Smoker    Packs/day: 0.00    Types: Cigarettes  . Smokeless tobacco: Current User    Types: Snuff  . Tobacco comment: 2 cigarettes with coffee in the morning  Substance Use Topics  . Alcohol use: No    Frequency: Never    Comment: in recovery x since 2013  . Drug use: No    Medications: I have reviewed the patient's current medications.      Allergies as of 10/13/2017      Reactions   Tetracyclines & Related Anxiety               Medication List            Accurate as of 10/13/17  1:58 PM. Always use your most recent med list.           amLODipine 10 MG tablet Commonly known as:  NORVASC Take 10 mg by mouth daily.   FLUoxetine 20 MG tablet Commonly known as:  PROZAC Take 20 mg by mouth daily.   lovastatin 20 MG tablet Commonly known as:  MEVACOR Take 20 mg by mouth at bedtime.   sodium fluoride 1.1 % Crea dental cream Commonly known as:  PREVIDENT 5000 PLUS Apply cream to tooth brush. Brush teeth for 2 minutes.  Spit out excess. Repeat nightly.        ROS:  A comprehensive review of systems was negative except for: Eyes: positive for pain in his eyes Ears, nose, mouth, throat, and face: positive for sinus problems Gastrointestinal: positive for indigestion Hematologic/lymphatic: positive for cervical lymphadenopathy on left Musculoskeletal: positive for stiff joints Endocrine: positive for tired/ sluggish  Blood pressure (!) 161/91, pulse 86, temperature 98 F (36.7 C), resp. rate 18, height 5\' 7"  (1.702 m), weight 206 lb (93.4 kg). Physical Exam  Constitutional: He is oriented to person, place, and time and well-developed, well-nourished, and in no distress.  HENT:  Head: Normocephalic.  Eyes: Pupils are equal, round, and reactive to light.  Neck: Normal range of motion.  Cardiovascular: Normal rate and regular rhythm.  Pulmonary/Chest: Effort normal and breath sounds normal.  Abdominal: Soft. He exhibits no distension.  There is no tenderness.  Musculoskeletal: Normal range of motion. He exhibits no edema.  Lymphadenopathy:    He has cervical adenopathy.       Left cervical: Superficial cervical adenopathy present.  Neurological: He is alert and oriented to person, place, and time.  Skin: Skin is warm and dry.  Psychiatric: Mood, memory, affect and judgment normal.  Vitals reviewed.   Results: Personally reviewed imaging, lymph node on left cervical neck, hypermetabolic activity in left larynx, airway appears patent, no signs of compression   CT neck 09/2017  IMPRESSION: 1. Solitary left level 2 lymph node measures 2.3 x 2.4 x 3.2 cm, likely reflecting metastatic disease. 2. The study is degraded by patient motion. Asymmetric soft tissue is suspected along the left piriform sinus. Please correlate with exam. 3. Mild degenerative changes in the cervical spine.  PET 09/2017 IMPRESSION: 1. Hypermetabolic activity along the left aryepiglottic fold, left piriform sinus, and left paralaryngeal space compatible with primary malignancy. A left level IIa lymph node measuring 2.3 cm in short axis has a maximum SUV of 15.3, highly hypermetabolic and compatible with malignancy. A 0.6 cm in short axis left level III lymph node has a maximum SUV of only 2.8, minimally above blood pool, equivocal for malignant involvement. 2. No involvement of the chest, abdomen/pelvis, or skeleton identified. 3. Other imaging findings of potential clinical significance: Mild chronic bilateral maxillary sinusitis. Aortic Atherosclerosis (ICD10-I70.0). Coronary atherosclerosis. Left adrenal adenoma. Bilateral nonobstructive nephrolithiasis.  Assessment & Plan:  Timothy Parks is a 57 y.o. male with laryngeal cancer who is going to undergo chemotherapy and radiation. He is currently having no issues with swallowing or breathing.   -Port a catheter placement, will try for the left given that he is right handed     All questions were answered to the satisfaction of the patient.  The risk and benefits of port a catheter were discussed including but not limited to bleeding, infection, risk of pneumothorax, risk of malfunction.  After careful consideration, AMADI FRADY has decided to proceed.    Virl Cagey 10/13/2017, 1:58 PM

## 2017-10-17 ENCOUNTER — Encounter (HOSPITAL_COMMUNITY): Payer: Self-pay

## 2017-10-17 ENCOUNTER — Inpatient Hospital Stay (HOSPITAL_COMMUNITY): Payer: Medicaid Other | Attending: Hematology

## 2017-10-17 ENCOUNTER — Encounter (HOSPITAL_COMMUNITY)
Admission: RE | Admit: 2017-10-17 | Discharge: 2017-10-17 | Disposition: A | Discharge status: Home / Self Care | Payer: Medicaid Other | Source: Ambulatory Visit | Attending: General Surgery | Admitting: General Surgery

## 2017-10-17 ENCOUNTER — Other Ambulatory Visit: Payer: Self-pay

## 2017-10-17 DIAGNOSIS — Z01812 Encounter for preprocedural laboratory examination: Secondary | ICD-10-CM | POA: Diagnosis not present

## 2017-10-17 DIAGNOSIS — C329 Malignant neoplasm of larynx, unspecified: Secondary | ICD-10-CM

## 2017-10-17 DIAGNOSIS — Z5111 Encounter for antineoplastic chemotherapy: Secondary | ICD-10-CM | POA: Insufficient documentation

## 2017-10-17 HISTORY — DX: Malignant (primary) neoplasm, unspecified: C80.1

## 2017-10-17 LAB — BASIC METABOLIC PANEL
Anion gap: 11 (ref 5–15)
BUN: 8 mg/dL (ref 6–20)
CO2: 24 mmol/L (ref 22–32)
Calcium: 9.4 mg/dL (ref 8.9–10.3)
Chloride: 102 mmol/L (ref 101–111)
Creatinine, Ser: 0.92 mg/dL (ref 0.61–1.24)
GFR calc Af Amer: >60 mL/min (ref >60)
GLUCOSE: 94 mg/dL (ref 65–99)
Potassium: 3.9 mmol/L (ref 3.5–5.1)
Sodium: 137 mmol/L (ref 135–145)

## 2017-10-17 LAB — CBC WITH DIFFERENTIAL/PLATELET
Basophils Absolute: 0 10*3/uL (ref 0.0–0.1)
Basophils Relative: 0 %
EOS PCT: 2 %
Eosinophils Absolute: 0.2 10*3/uL (ref 0.0–0.7)
HEMATOCRIT: 43.3 % (ref 39.0–52.0)
Hemoglobin: 14.1 g/dL (ref 13.0–17.0)
LYMPHS ABS: 4.6 10*3/uL — AB (ref 0.7–4.0)
LYMPHS PCT: 36 %
MCH: 29.3 pg (ref 26.0–34.0)
MCHC: 32.6 g/dL (ref 30.0–36.0)
MCV: 89.8 fL (ref 78.0–100.0)
MONO ABS: 1.1 10*3/uL — AB (ref 0.1–1.0)
Monocytes Relative: 8 %
Neutro Abs: 6.9 10*3/uL (ref 1.7–7.7)
Neutrophils Relative %: 54 %
PLATELETS: 387 10*3/uL (ref 150–400)
RBC: 4.82 MIL/uL (ref 4.22–5.81)
RDW: 12.3 % (ref 11.5–15.5)
WBC: 12.8 10*3/uL — AB (ref 4.0–10.5)

## 2017-10-17 MED ORDER — LIDOCAINE-PRILOCAINE 2.5-2.5 % EX CREA
TOPICAL_CREAM | CUTANEOUS | 3 refills | Status: DC
Start: 2017-10-17 — End: 2017-12-06

## 2017-10-17 MED ORDER — PROCHLORPERAZINE MALEATE 10 MG PO TABS: 10.0000 mg | ORAL_TABLET | Freq: Four times a day (QID) | ORAL | 1 refills | Status: DC | PRN

## 2017-10-17 MED ORDER — ONDANSETRON HCL 8 MG PO TABS: 8.0000 mg | ORAL_TABLET | Freq: Two times a day (BID) | ORAL | 1 refills | Status: DC | PRN

## 2017-10-17 NOTE — Progress Notes (Signed)
Chemotherapy teaching completed.  Consent signed.  Extensive teaching packet given.   

## 2017-10-17 NOTE — Patient Instructions (Signed)
Timothy Parks  10/17/2017     @PREFPERIOPPHARMACY @   Your procedure is scheduled on  10/21/2017   Report to [facility] at  810   A.M.  Call this number if you have problems the morning of surgery:  [phone]   Remember:  Do not eat food or drink liquids after midnight.  Take these medicines the morning of surgery with A SIP OF WATER  Prozac, claritin, zofran.   Do not wear jewelry, make-up or nail polish.  Do not wear lotions, powders, or perfumes, or deodorant.  Do not shave 48 hours prior to surgery.  Men may shave face and neck.  Do not bring valuables to the hospital.  [facility] is not responsible for any belongings or valuables.  Contacts, dentures or bridgework may not be worn into surgery.  Leave your suitcase in the car.  After surgery it may be brought to your room.  For patients admitted to the hospital, discharge time will be determined by your treatment team.  Patients discharged the day of surgery will not be allowed to drive home.   Name and phone number of your driver:   family Special instructions:  None  Please read over the following fact sheets that you were given. Anesthesia Post-op Instructions and Care and Recovery After Surgery       Implanted Port Insertion Implanted port insertion is a procedure to put in a port and catheter. The port is a device with an injectable disk that can be accessed by your health care provider. The port is connected to a vein in the chest or neck by a small flexible tube (catheter). There are different types of ports. The implanted port may be used as a long-term IV access for:  Medicines, such as chemotherapy.  Fluids.  Liquid nutrition, such as total parenteral nutrition (TPN).  Blood samples.  Having a port means that your health care provider will not need to use the veins in your arms for these procedures. Tell a health care provider about:  Any allergies you have.  All  medicines you are taking, especially blood thinners, as well as any vitamins, herbs, eye drops, creams, over-the-counter medicines, and steroids.  Any problems you or family members have had with anesthetic medicines.  Any blood disorders you have.  Any surgeries you have had.  Any medical conditions you have, including diabetes or kidney problems.  Whether you are pregnant or may be pregnant. What are the risks? Generally, this is a safe procedure. However, problems may occur, including:  Allergic reactions to medicines or dyes.  Damage to other structures or organs.  Infection.  Damage to the blood vessel, bruising, or bleeding at the puncture site.  Blood clot.  Breakdown of the skin over the port.  A collection of air in the chest that can cause one of the lungs to collapse (pneumothorax). This is rare.  What happens before the procedure? Staying hydrated Follow instructions from your health care provider about hydration, which may include:  Up to 2 hours before the procedure - you may continue to drink clear liquids, such as water, clear fruit juice, black coffee, and plain tea.  Eating and drinking restrictions  Follow instructions from your health care provider about eating and drinking, which may include: ? 8 hours before the procedure - stop eating heavy meals or foods such as meat, fried foods, or fatty foods. ? 6 hours  before the procedure - stop eating light meals or foods, such as toast or cereal. ? 6 hours before the procedure - stop drinking milk or drinks that contain milk. ? 2 hours before the procedure - stop drinking clear liquids. Medicines  Ask your health care provider about: ? Changing or stopping your regular medicines. This is especially important if you are taking diabetes medicines or blood thinners. ? Taking medicines such as aspirin and ibuprofen. These medicines can thin your blood. Do not take these medicines before your procedure if your  health care provider instructs you not to.  You may be given antibiotic medicine to help prevent infection. General instructions  Plan to have someone take you home from the hospital or clinic.  If you will be going home right after the procedure, plan to have someone with you for 24 hours.  You may have blood tests.  You may be asked to shower with a germ-killing soap. What happens during the procedure?  To lower your risk of infection: ? Your health care team will wash or sanitize their hands. ? Your skin will be washed with soap. ? Hair may be removed from the surgical area.  An IV tube will be inserted into one of your veins.  You will be given one or more of the following: ? A medicine to help you relax (sedative). ? A medicine to numb the area (local anesthetic).  Two small cuts (incisions) will be made to insert the port. ? One incision will be made in your neck to get access to the vein where the catheter will lie. ? The other incision will be made in the upper chest. This is where the port will lie.  The procedure may be done using continuous X-ray (fluoroscopy) or other imaging tools for guidance.  The port and catheter will be placed. There may be a small, raised area where the port is.  The port will be flushed with a salt solution (saline), and blood will be drawn to make sure that it is working correctly.  The incisions will be closed.  Bandages (dressings) may be placed over the incisions. The procedure may vary among health care providers and hospitals. What happens after the procedure?  Your blood pressure, heart rate, breathing rate, and blood oxygen level will be monitored until the medicines you were given have worn off.  Do not drive for 24 hours if you were given a sedative.  You will be given a manufacturer's information card for the type of port that you have. Keep this with you.  Your port will need to be flushed and checked as told by your  health care provider, usually every few weeks.  A chest X-ray will be done to: ? Check the placement of the port. ? Make sure there is no injury to your lung. Summary  Implanted port insertion is a procedure to put in a port and catheter.  The implanted port is used as a long-term IV access.  The port will need to be flushed and checked as told by your health care provider, usually every few weeks.  Keep your manufacturer's information card with you at all times. This information is not intended to replace advice given to you by your health care provider. Make sure you discuss any questions you have with your health care provider. Document Released: 04/25/2013 Document Revised: 05/26/2016 Document Reviewed: 05/26/2016 Elsevier Interactive Patient Education  2017 [place], Care After This sheet  gives you information about how to care for yourself after your procedure. Your health care provider may also give you more specific instructions. If you have problems or questions, contact your health care provider. What can I expect after the procedure? After your procedure, it is common to have:  Discomfort at the port insertion site.  Bruising on the skin over the port. This should improve over 3-4 days.  Follow these instructions at home: [facility] care  After your port is placed, you will get a manufacturer's information card. The card has information about your port. Keep this card with you at all times.  Take care of the port as told by your health care provider. Ask your health care provider if you or a family member can get training for taking care of the port at home. A home health care nurse may also take care of the port.  Make sure to remember what type of port you have. Incision care  Follow instructions from your health care provider about how to take care of your port insertion site. Make sure you: ? Wash your hands with soap and water before you  change your bandage (dressing). If soap and water are not available, use hand sanitizer. ? Change your dressing as told by your health care provider. ? Leave stitches (sutures), skin glue, or adhesive strips in place. These skin closures may need to stay in place for 2 weeks or longer. If adhesive strip edges start to loosen and curl up, you may trim the loose edges. Do not remove adhesive strips completely unless your health care provider tells you to do that.  Check your port insertion site every day for signs of infection. Check for: ? More redness, swelling, or pain. ? More fluid or blood. ? Warmth. ? Pus or a bad smell. General instructions  Do not take baths, swim, or use a hot tub until your health care provider approves.  Do not lift anything that is heavier than 10 lb (4.5 kg) for a week, or as told by your health care provider.  Ask your health care provider when it is okay to: ? Return to work or school. ? Resume usual physical activities or sports.  Do not drive for 24 hours if you were given a medicine to help you relax (sedative).  Take over-the-counter and prescription medicines only as told by your health care provider.  Wear a medical alert bracelet in case of an emergency. This will tell any health care providers that you have a port.  Keep all follow-up visits as told by your health care provider. This is important. Contact a health care provider if:  You cannot flush your port with saline as directed, or you cannot draw blood from the port.  You have a fever or chills.  You have more redness, swelling, or pain around your port insertion site.  You have more fluid or blood coming from your port insertion site.  Your port insertion site feels warm to the touch.  You have pus or a bad smell coming from the port insertion site. Get help right away if:  You have chest pain or shortness of breath.  You have bleeding from your port that you cannot  control. Summary  Take care of the port as told by your health care provider.  Change your dressing as told by your health care provider.  Keep all follow-up visits as told by your health care provider. This information is not intended to  replace advice given to you by your health care provider. Make sure you discuss any questions you have with your health care provider. Document Released: 04/25/2013 Document Revised: 05/26/2016 Document Reviewed: 05/26/2016 Elsevier Interactive Patient Education  2017 [place] An implanted port is a type of central line that is placed under the skin. Central lines are used to provide IV access when treatment or nutrition needs to be given through a person's veins. Implanted ports are used for long-term IV access. An implanted port may be placed because:  You need IV medicine that would be irritating to the small veins in your hands or arms.  You need long-term IV medicines, such as antibiotics.  You need IV nutrition for a long period.  You need frequent blood draws for lab tests.  You need dialysis.  Implanted ports are usually placed in the chest area, but they can also be placed in the upper arm, the abdomen, or the leg. An implanted port has two main parts:  Reservoir. The reservoir is round and will appear as a small, raised area under your skin. The reservoir is the part where a needle is inserted to give medicines or draw blood.  Catheter. The catheter is a thin, flexible tube that extends from the reservoir. The catheter is placed into a large vein. Medicine that is inserted into the reservoir goes into the catheter and then into the vein.  How will I care for my incision site? Do not get the incision site wet. Bathe or shower as directed by your health care provider. How is my port accessed? Special steps must be taken to access the port:  Before the port is accessed, a numbing cream can be placed on the  skin. This helps numb the skin over the port site.  Your health care provider uses a sterile technique to access the port. ? Your health care provider must put on a mask and sterile gloves. ? The skin over your port is cleaned carefully with an antiseptic and allowed to dry. ? The port is gently pinched between sterile gloves, and a needle is inserted into the port.  Only "non-coring" port needles should be used to access the port. Once the port is accessed, a blood return should be checked. This helps ensure that the port is in the vein and is not clogged.  If your port needs to remain accessed for a constant infusion, a clear (transparent) bandage will be placed over the needle site. The bandage and needle will need to be changed every week, or as directed by your health care provider.  Keep the bandage covering the needle clean and dry. Do not get it wet. Follow your health care provider's instructions on how to take a shower or bath while the port is accessed.  If your port does not need to stay accessed, no bandage is needed over the port.  What is flushing? Flushing helps keep the port from getting clogged. Follow your health care provider's instructions on how and when to flush the port. Ports are usually flushed with saline solution or a medicine called heparin. The need for flushing will depend on how the port is used.  If the port is used for intermittent medicines or blood draws, the port will need to be flushed: ? After medicines have been given. ? After blood has been drawn. ? As part of routine maintenance.  If a constant infusion is running, the port may not need  to be flushed.  How long will my port stay implanted? The port can stay in for as long as your health care provider thinks it is needed. When it is time for the port to come out, surgery will be done to remove it. The procedure is similar to the one performed when the port was put in. When should I seek immediate  medical care? When you have an implanted port, you should seek immediate medical care if:  You notice a bad smell coming from the incision site.  You have swelling, redness, or drainage at the incision site.  You have more swelling or pain at the port site or the surrounding area.  You have a fever that is not controlled with medicine.  This information is not intended to replace advice given to you by your health care provider. Make sure you discuss any questions you have with your health care provider. Document Released: 07/05/2005 Document Revised: 12/11/2015 Document Reviewed: 03/12/2013 Elsevier Interactive Patient Education  2017 [place] Anesthesia is a term that refers to techniques, procedures, and medicines that help a person stay safe and comfortable during a medical procedure. Monitored anesthesia care, or sedation, is one type of anesthesia. Your anesthesia specialist may recommend sedation if you will be having a procedure that does not require you to be unconscious, such as:  Cataract surgery.  A dental procedure.  A biopsy.  A colonoscopy.  During the procedure, you may receive a medicine to help you relax (sedative). There are three levels of sedation:  Mild sedation. At this level, you may feel awake and relaxed. You will be able to follow directions.  Moderate sedation. At this level, you will be sleepy. You may not remember the procedure.  Deep sedation. At this level, you will be asleep. You will not remember the procedure.  The more medicine you are given, the deeper your level of sedation will be. Depending on how you respond to the procedure, the anesthesia specialist may change your level of sedation or the type of anesthesia to fit your needs. An anesthesia specialist will monitor you closely during the procedure. Let your health care provider know about:  Any allergies you have.  All medicines you are taking, including  vitamins, herbs, eye drops, creams, and over-the-counter medicines.  Any use of steroids (by mouth or as a cream).  Any problems you or family members have had with sedatives and anesthetic medicines.  Any blood disorders you have.  Any surgeries you have had.  Any medical conditions you have, such as sleep apnea.  Whether you are pregnant or may be pregnant.  Any use of cigarettes, alcohol, or street drugs. What are the risks? Generally, this is a safe procedure. However, problems may occur, including:  Getting too much medicine (oversedation).  Nausea.  Allergic reaction to medicines.  Trouble breathing. If this happens, a breathing tube may be used to help with breathing. It will be removed when you are awake and breathing on your own.  Heart trouble.  Lung trouble.  Before the procedure Staying hydrated Follow instructions from your health care provider about hydration, which may include:  Up to 2 hours before the procedure - you may continue to drink clear liquids, such as water, clear fruit juice, black coffee, and plain tea.  Eating and drinking restrictions Follow instructions from your health care provider about eating and drinking, which may include:  8 hours before the procedure - stop eating heavy  meals or foods such as meat, fried foods, or fatty foods.  6 hours before the procedure - stop eating light meals or foods, such as toast or cereal.  6 hours before the procedure - stop drinking milk or drinks that contain milk.  2 hours before the procedure - stop drinking clear liquids.  Medicines Ask your health care provider about:  Changing or stopping your regular medicines. This is especially important if you are taking diabetes medicines or blood thinners.  Taking medicines such as aspirin and ibuprofen. These medicines can thin your blood. Do not take these medicines before your procedure if your health care provider instructs you not to.  Tests and  exams  You will have a physical exam.  You may have blood tests done to show: ? How well your kidneys and liver are working. ? How well your blood can clot.  General instructions  Plan to have someone take you home from the hospital or clinic.  If you will be going home right after the procedure, plan to have someone with you for 24 hours.  What happens during the procedure?  Your blood pressure, heart rate, breathing, level of pain and overall condition will be monitored.  An IV tube will be inserted into one of your veins.  Your anesthesia specialist will give you medicines as needed to keep you comfortable during the procedure. This may mean changing the level of sedation.  The procedure will be performed. After the procedure  Your blood pressure, heart rate, breathing rate, and blood oxygen level will be monitored until the medicines you were given have worn off.  Do not drive for 24 hours if you received a sedative.  You may: ? Feel sleepy, clumsy, or nauseous. ? Feel forgetful about what happened after the procedure. ? Have a sore throat if you had a breathing tube during the procedure. ? Vomit. This information is not intended to replace advice given to you by your health care provider. Make sure you discuss any questions you have with your health care provider. Document Released: 03/31/2005 Document Revised: 12/12/2015 Document Reviewed: 10/26/2015 Elsevier Interactive Patient Education  2018 [place], Care After These instructions provide you with information about caring for yourself after your procedure. Your health care provider may also give you more specific instructions. Your treatment has been planned according to current medical practices, but problems sometimes occur. Call your health care provider if you have any problems or questions after your procedure. What can I expect after the procedure? After your procedure, it is  common to:  Feel sleepy for several hours.  Feel clumsy and have poor balance for several hours.  Feel forgetful about what happened after the procedure.  Have poor judgment for several hours.  Feel nauseous or vomit.  Have a sore throat if you had a breathing tube during the procedure.  Follow these instructions at home: For at least 24 hours after the procedure:   Do not: ? Participate in activities in which you could fall or become injured. ? Drive. ? Use heavy machinery. ? Drink alcohol. ? Take sleeping pills or medicines that cause drowsiness. ? Make important decisions or sign legal documents. ? Take care of children on your own.  Rest. Eating and drinking  Follow the diet that is recommended by your health care provider.  If you vomit, drink water, juice, or soup when you can drink without vomiting.  Make sure you have little or no nausea  before eating solid foods. General instructions  Have a responsible adult stay with you until you are awake and alert.  Take over-the-counter and prescription medicines only as told by your health care provider.  If you smoke, do not smoke without supervision.  Keep all follow-up visits as told by your health care provider. This is important. Contact a health care provider if:  You keep feeling nauseous or you keep vomiting.  You feel light-headed.  You develop a rash.  You have a fever. Get help right away if:  You have trouble breathing. This information is not intended to replace advice given to you by your health care provider. Make sure you discuss any questions you have with your health care provider. Document Released: 10/26/2015 Document Revised: 02/25/2016 Document Reviewed: 10/26/2015 Elsevier Interactive Patient Education  [employer].

## 2017-10-21 ENCOUNTER — Ambulatory Visit (HOSPITAL_COMMUNITY)
Admission: RE | Admit: 2017-10-21 | Discharge: 2017-10-21 | Disposition: A | Discharge status: Home / Self Care | Payer: Medicaid Other | Source: Ambulatory Visit | Attending: General Surgery | Admitting: General Surgery

## 2017-10-21 ENCOUNTER — Encounter (HOSPITAL_COMMUNITY)
Admission: RE | Disposition: A | Discharge status: Home / Self Care | Payer: Self-pay | Source: Ambulatory Visit | Attending: General Surgery

## 2017-10-21 ENCOUNTER — Ambulatory Visit (HOSPITAL_COMMUNITY): Payer: Medicaid Other | Admitting: Anesthesiology

## 2017-10-21 ENCOUNTER — Encounter (HOSPITAL_COMMUNITY): Payer: Self-pay | Admitting: *Deleted

## 2017-10-21 ENCOUNTER — Ambulatory Visit (HOSPITAL_COMMUNITY): Payer: Medicaid Other

## 2017-10-21 DIAGNOSIS — Z79899 Other long term (current) drug therapy: Secondary | ICD-10-CM | POA: Diagnosis not present

## 2017-10-21 DIAGNOSIS — F419 Anxiety disorder, unspecified: Secondary | ICD-10-CM | POA: Insufficient documentation

## 2017-10-21 DIAGNOSIS — I10 Essential (primary) hypertension: Secondary | ICD-10-CM | POA: Insufficient documentation

## 2017-10-21 DIAGNOSIS — Z95828 Presence of other vascular implants and grafts: Secondary | ICD-10-CM

## 2017-10-21 DIAGNOSIS — F17210 Nicotine dependence, cigarettes, uncomplicated: Secondary | ICD-10-CM | POA: Diagnosis not present

## 2017-10-21 DIAGNOSIS — C329 Malignant neoplasm of larynx, unspecified: Secondary | ICD-10-CM | POA: Diagnosis present

## 2017-10-21 HISTORY — DX: Chronic obstructive pulmonary disease, unspecified: J44.9

## 2017-10-21 HISTORY — PX: PORTACATH PLACEMENT: SHX2246

## 2017-10-21 SURGERY — INSERTION, TUNNELED CENTRAL VENOUS DEVICE, WITH PORT
Anesthesia: Monitor Anesthesia Care | Site: Chest | Laterality: Left

## 2017-10-21 MED ORDER — OXYCODONE HCL 5 MG PO TABS: 5.0000 mg | ORAL_TABLET | ORAL | 0 refills | Status: DC | PRN

## 2017-10-21 MED ORDER — CEFAZOLIN SODIUM-DEXTROSE 2-4 GM/100ML-% IV SOLN
2.0000 g | INTRAVENOUS | Status: AC
  Administered 2017-10-21: 2 g via INTRAVENOUS

## 2017-10-21 MED ORDER — LIDOCAINE HCL (PF) 1 % IJ SOLN
INTRAMUSCULAR | Status: AC
  Filled 2017-10-21: qty 30

## 2017-10-21 MED ORDER — HYDROCODONE-ACETAMINOPHEN 7.5-325 MG PO TABS: 1.0000 | ORAL_TABLET | Freq: Once | ORAL | Status: DC | PRN

## 2017-10-21 MED ORDER — HEPARIN SOD (PORK) LOCK FLUSH 100 UNIT/ML IV SOLN
INTRAVENOUS | Status: DC | PRN
  Administered 2017-10-21: 5 [IU] via INTRAVENOUS

## 2017-10-21 MED ORDER — FENTANYL CITRATE (PF) 100 MCG/2ML IJ SOLN
INTRAMUSCULAR | Status: AC
  Filled 2017-10-21: qty 2

## 2017-10-21 MED ORDER — MIDAZOLAM HCL 2 MG/2ML IJ SOLN
INTRAMUSCULAR | Status: AC
  Filled 2017-10-21: qty 2

## 2017-10-21 MED ORDER — PROPOFOL 500 MG/50ML IV EMUL
INTRAVENOUS | Status: DC | PRN
  Administered 2017-10-21: 55 ug/kg/min via INTRAVENOUS

## 2017-10-21 MED ORDER — MEPERIDINE HCL 50 MG/ML IJ SOLN: 6.2500 mg | INTRAMUSCULAR | Status: DC | PRN

## 2017-10-21 MED ORDER — CHLORHEXIDINE GLUCONATE CLOTH 2 % EX PADS: 6.0000 | MEDICATED_PAD | Freq: Once | CUTANEOUS | Status: DC

## 2017-10-21 MED ORDER — ONDANSETRON HCL 4 MG/2ML IJ SOLN: 4.0000 mg | Freq: Once | INTRAMUSCULAR | Status: DC | PRN

## 2017-10-21 MED ORDER — LIDOCAINE HCL (PF) 1 % IJ SOLN
INTRAMUSCULAR | Status: DC | PRN
  Administered 2017-10-21: 7 mL

## 2017-10-21 MED ORDER — HEPARIN SOD (PORK) LOCK FLUSH 100 UNIT/ML IV SOLN
INTRAVENOUS | Status: AC
  Filled 2017-10-21: qty 5

## 2017-10-21 MED ORDER — HYDROMORPHONE HCL 1 MG/ML IJ SOLN: 0.2500 mg | INTRAMUSCULAR | Status: DC | PRN

## 2017-10-21 MED ORDER — DOCUSATE SODIUM 100 MG PO CAPS: 100.0000 mg | ORAL_CAPSULE | Freq: Two times a day (BID) | ORAL | 0 refills | Status: DC | PRN

## 2017-10-21 MED ORDER — FENTANYL CITRATE (PF) 100 MCG/2ML IJ SOLN
INTRAMUSCULAR | Status: DC | PRN
  Administered 2017-10-21 (×2): 25 ug via INTRAVENOUS

## 2017-10-21 MED ORDER — CEFAZOLIN SODIUM-DEXTROSE 2-4 GM/100ML-% IV SOLN
INTRAVENOUS | Status: AC
  Filled 2017-10-21: qty 100

## 2017-10-21 MED ORDER — LACTATED RINGERS IV SOLN
INTRAVENOUS | Status: DC
  Administered 2017-10-21: 09:00:00 via INTRAVENOUS

## 2017-10-21 MED ORDER — KETOROLAC TROMETHAMINE 30 MG/ML IJ SOLN: 30.0000 mg | Freq: Once | INTRAMUSCULAR | Status: DC | PRN

## 2017-10-21 MED ORDER — SODIUM CHLORIDE 0.9 % IV SOLN
INTRAVENOUS | Status: AC | PRN
  Administered 2017-10-21: 500 mL via INTRAMUSCULAR

## 2017-10-21 MED ORDER — MIDAZOLAM HCL 5 MG/5ML IJ SOLN
INTRAMUSCULAR | Status: DC | PRN
  Administered 2017-10-21: 2 mg via INTRAVENOUS

## 2017-10-21 SURGICAL SUPPLY — 39 items
ADH SKN CLS APL DERMABOND .7 (GAUZE/BANDAGES/DRESSINGS) ×1
APPLIER CLIP 9.375 SM OPEN (CLIP)
APR CLP SM 9.3 20 MLT OPN (CLIP)
BAG DECANTER FOR FLEXI CONT (MISCELLANEOUS) ×3 IMPLANT
BAG HAMPER (MISCELLANEOUS) ×3 IMPLANT
CHLORAPREP W/TINT 10.5 ML (MISCELLANEOUS) ×3 IMPLANT
CLIP APPLIE 9.375 SM OPEN (CLIP) IMPLANT
CLOTH BEACON ORANGE TIMEOUT ST (SAFETY) ×3 IMPLANT
COVER LIGHT HANDLE STERIS (MISCELLANEOUS) ×6 IMPLANT
DECANTER SPIKE VIAL GLASS SM (MISCELLANEOUS) ×3 IMPLANT
DERMABOND ADVANCED (GAUZE/BANDAGES/DRESSINGS) ×2
DERMABOND ADVANCED .7 DNX12 (GAUZE/BANDAGES/DRESSINGS) ×1 IMPLANT
DRAPE C-ARM FOLDED MOBILE STRL (DRAPES) ×3 IMPLANT
ELECT REM PT RETURN 9FT ADLT (ELECTROSURGICAL) ×3
ELECTRODE REM PT RTRN 9FT ADLT (ELECTROSURGICAL) ×1 IMPLANT
GLOVE BIO SURGEON STRL SZ 6.5 (GLOVE) ×2 IMPLANT
GLOVE BIO SURGEONS STRL SZ 6.5 (GLOVE) ×1
GLOVE BIOGEL PI IND STRL 6.5 (GLOVE) ×1 IMPLANT
GLOVE BIOGEL PI IND STRL 7.0 (GLOVE) ×1 IMPLANT
GLOVE BIOGEL PI INDICATOR 6.5 (GLOVE) ×2
GLOVE BIOGEL PI INDICATOR 7.0 (GLOVE) ×4
GOWN STRL REUS W/TWL LRG LVL3 (GOWN DISPOSABLE) ×6 IMPLANT
IV NS 500ML (IV SOLUTION) ×3
IV NS 500ML BAXH (IV SOLUTION) ×1 IMPLANT
KIT PORT POWER 8FR ISP MRI (Port) ×3 IMPLANT
KIT TURNOVER KIT A (KITS) ×3 IMPLANT
MANIFOLD NEPTUNE II (INSTRUMENTS) ×3 IMPLANT
NDL HYPO 25X1 1.5 SAFETY (NEEDLE) ×1 IMPLANT
NEEDLE HYPO 25X1 1.5 SAFETY (NEEDLE) ×3 IMPLANT
PACK MINOR (CUSTOM PROCEDURE TRAY) ×3 IMPLANT
PAD ARMBOARD 7.5X6 YLW CONV (MISCELLANEOUS) ×3 IMPLANT
SET BASIN LINEN APH (SET/KITS/TRAYS/PACK) ×3 IMPLANT
SUT MNCRL AB 4-0 PS2 18 (SUTURE) ×3 IMPLANT
SUT PROLENE 2 0 SH 30 (SUTURE) ×3 IMPLANT
SUT PROLENE 3 0 PS 2 (SUTURE) ×2 IMPLANT
SUT VIC AB 3-0 SH 27 (SUTURE) ×3
SUT VIC AB 3-0 SH 27X BRD (SUTURE) ×1 IMPLANT
SYR 20CC LL (SYRINGE) ×3 IMPLANT
SYR CONTROL 10ML LL (SYRINGE) ×3 IMPLANT

## 2017-10-21 NOTE — Op Note (Signed)
Operative Note 10/21/17   Preoperative Diagnosis: Laryngeal cancer   Postoperative Diagnosis: Same   Procedure(s) Performed: Port-A-Cath placement, left subclavian    Surgeon: Lanell Matar. Constance Haw, MD   Assistants: No qualified resident was available   Anesthesia: Monitored anesthesia care   Anesthesiologist: Dr. Lucianne Lei der Rick Duff    Specimens: None   Estimated Blood Loss: Minimal   Fluoroscopy time: See Fluoroscopy documentation   Blood Replacement: None    Complications: None    Operative Findings:  Normal anatomy   Indications: Timothy Parks is a 57 yo with laryngeal cancer who needs chemotherapy and radiation. He was sent to me for port a catheter placement. He is right handed and has no contraindications to left sided placement. After a discussion of the risk and benefits of port placement including but not limited to bleeding, infection, pneumothorax, malfunction, he opted to proceed.   Procedure: The patient was brought into the operating room and  was induced.  One percent lidocaine was used for local anesthesia.   The left chest and neck was prepped and draped in the usual sterile fashion.  Preoperative antibiotics were given.  An incision was made below the left clavicle. A subcutaneous pocket was formed. The needles advanced into the left subclavian vein using the Seldinger technique without difficulty. A guidewire was then advanced into the right atrium under fluoroscopic guidance.  Ectopia was not noted. An introducer and peel-away sheath were placed over the guidewire. The catheter was then inserted through the peel-away sheath and the peel-away sheath was removed.  A spot film was performed to confirm the position. The catheter was then attached to the port and the port placed in subcutaneous pocket. Adequate positioning was confirmed by fluoroscopy. Hemostasis was confirmed, and the port was secured with 2-0 prolene sutures.  Good backflow of blood was noted on aspiration of  the port. The port was flushed with heparin flush. Subcutaneous layer was reapproximated using a 3-0 Vicryl interrupted suture. The skin was closed using a 4-0 Vicryl subcuticular suture. Dermabond was applied.  All tape and needle counts were correct at the end of the procedure. The patient was transferred to PACU in stable condition. A chest x-ray will be performed at that time.  Curlene Labrum, MD [facility] [address] [place], [place] [id number] [phone] (office)

## 2017-10-21 NOTE — Progress Notes (Signed)
izedPhone call from Mother Timothy Parks, reviewed discharge instructions with patients mother, advised mother that Timothy Parks brother, has taken patient home. Mother and brother both advised at discharge patient not to be left alone, operate heavy machinery, drive or sign legal documents for 24 hours. Brother and mother verbalized understanding.

## 2017-10-21 NOTE — Transfer of Care (Signed)
Immediate Anesthesia Transfer of Care Note  Patient: Timothy Parks  Procedure(s) Performed: INSERTION PORT-A-CATH (Left Chest)  Patient Location: PACU  Anesthesia Type:MAC  Level of Consciousness: awake and patient cooperative  Airway & Oxygen Therapy: Patient Spontanous Breathing and Patient connected to face mask oxygen  Post-op Assessment: Report given to RN, Post -op Vital signs reviewed and stable and Patient moving all extremities  Post vital signs: Reviewed and stable  Last Vitals:  Vitals Value Taken Time  BP    Temp    Pulse 81 10/21/2017 10:17 AM  Resp 15 10/21/2017 10:17 AM  SpO2 96 % 10/21/2017 10:17 AM  Vitals shown include unvalidated device data.  Last Pain:  Vitals:   10/21/17 0847  TempSrc: Oral  PainSc:          Complications: No apparent anesthesia complications

## 2017-10-21 NOTE — Discharge Instructions (Signed)
Keep area clean and dry. You can take a shower in 24 hours. Do not submerge in water.  Take tylenol and ibuprofen for pain control and roxicodone for severe pain.     Implanted Port Insertion, Care After This sheet gives you information about how to care for yourself after your procedure. Your health care provider may also give you more specific instructions. If you have problems or questions, contact your health care provider. What can I expect after the procedure? After your procedure, it is common to have:  Discomfort at the port insertion site.  Bruising on the skin over the port. This should improve over 3-4 days.  Follow these instructions at home: [facility] care  After your port is placed, you will get a manufacturer's information card. The card has information about your port. Keep this card with you at all times.  Take care of the port as told by your health care provider. Ask your health care provider if you or a family member can get training for taking care of the port at home. A home health care nurse may also take care of the port.  Make sure to remember what type of port you have. Incision care  Follow instructions from your health care provider about how to take care of your port insertion site. Make sure you: ? Wash your hands with soap and water before you change your bandage (dressing). If soap and water are not available, use hand sanitizer. ? Change your dressing as told by your health care provider. ? Leave stitches (sutures), skin glue, or adhesive strips in place. These skin closures may need to stay in place for 2 weeks or longer. If adhesive strip edges start to loosen and curl up, you may trim the loose edges. Do not remove adhesive strips completely unless your health care provider tells you to do that.  Check your port insertion site every day for signs of infection. Check for: ? More redness, swelling, or pain. ? More fluid or blood. ? Warmth. ? Pus or a bad  smell. General instructions  Do not take baths, swim, or use a hot tub until your health care provider approves.  Do not lift anything that is heavier than 10 lb (4.5 kg) for a week, or as told by your health care provider.  Ask your health care provider when it is okay to: ? Return to work or school. ? Resume usual physical activities or sports.  Do not drive for 24 hours if you were given a medicine to help you relax (sedative).  Take over-the-counter and prescription medicines only as told by your health care provider.  Wear a medical alert bracelet in case of an emergency. This will tell any health care providers that you have a port.  Keep all follow-up visits as told by your health care provider. This is important. Contact a health care provider if:  You cannot flush your port with saline as directed, or you cannot draw blood from the port.  You have a fever or chills.  You have more redness, swelling, or pain around your port insertion site.  You have more fluid or blood coming from your port insertion site.  Your port insertion site feels warm to the touch.  You have pus or a bad smell coming from the port insertion site. Get help right away if:  You have chest pain or shortness of breath.  You have bleeding from your port that you cannot control. Summary  Take care of the port as told by your health care provider.  Change your dressing as told by your health care provider.  Keep all follow-up visits as told by your health care provider. This information is not intended to replace advice given to you by your health care provider. Make sure you discuss any questions you have with your health care provider. Document Released: 04/25/2013 Document Revised: 05/26/2016 Document Reviewed: 05/26/2016 Elsevier Interactive Patient Education  2017 [place] An implanted port is a type of central line that is placed under the skin. Central  lines are used to provide IV access when treatment or nutrition needs to be given through a persons veins. Implanted ports are used for long-term IV access. An implanted port may be placed because:  You need IV medicine that would be irritating to the small veins in your hands or arms.  You need long-term IV medicines, such as antibiotics.  You need IV nutrition for a long period.  You need frequent blood draws for lab tests.  You need dialysis.  Implanted ports are usually placed in the chest area, but they can also be placed in the upper arm, the abdomen, or the leg. An implanted port has two main parts:  Reservoir. The reservoir is round and will appear as a small, raised area under your skin. The reservoir is the part where a needle is inserted to give medicines or draw blood.  Catheter. The catheter is a thin, flexible tube that extends from the reservoir. The catheter is placed into a large vein. Medicine that is inserted into the reservoir goes into the catheter and then into the vein.  How will I care for my incision site? Do not get the incision site wet. Bathe or shower as directed by your health care provider. How is my port accessed? Special steps must be taken to access the port:  Before the port is accessed, a numbing cream can be placed on the skin. This helps numb the skin over the port site.  Your health care provider uses a sterile technique to access the port. ? Your health care provider must put on a mask and sterile gloves. ? The skin over your port is cleaned carefully with an antiseptic and allowed to dry. ? The port is gently pinched between sterile gloves, and a needle is inserted into the port.  Only "non-coring" port needles should be used to access the port. Once the port is accessed, a blood return should be checked. This helps ensure that the port is in the vein and is not clogged.  If your port needs to remain accessed for a constant infusion, a clear  (transparent) bandage will be placed over the needle site. The bandage and needle will need to be changed every week, or as directed by your health care provider.  Keep the bandage covering the needle clean and dry. Do not get it wet. Follow your health care providers instructions on how to take a shower or bath while the port is accessed.  If your port does not need to stay accessed, no bandage is needed over the port.  What is flushing? Flushing helps keep the port from getting clogged. Follow your health care providers instructions on how and when to flush the port. Ports are usually flushed with saline solution or a medicine called heparin. The need for flushing will depend on how the port is used.  If the port is used  for intermittent medicines or blood draws, the port will need to be flushed: ? After medicines have been given. ? After blood has been drawn. ? As part of routine maintenance.  If a constant infusion is running, the port may not need to be flushed.  How long will my port stay implanted? The port can stay in for as long as your health care provider thinks it is needed. When it is time for the port to come out, surgery will be done to remove it. The procedure is similar to the one performed when the port was put in. When should I seek immediate medical care? When you have an implanted port, you should seek immediate medical care if:  You notice a bad smell coming from the incision site.  You have swelling, redness, or drainage at the incision site.  You have more swelling or pain at the port site or the surrounding area.  You have a fever that is not controlled with medicine.  This information is not intended to replace advice given to you by your health care provider. Make sure you discuss any questions you have with your health care provider. Document Released: 07/05/2005 Document Revised: 12/11/2015 Document Reviewed: 03/12/2013 Elsevier Interactive Patient  Education  2017 [employer]  PATIENT INSTRUCTIONS POST-ANESTHESIA  IMMEDIATELY FOLLOWING SURGERY:  Do not drive or operate machinery for the first twenty four hours after surgery.  Do not make any important decisions for twenty four hours after surgery or while taking narcotic pain medications or sedatives.  If you develop intractable nausea and vomiting or a severe headache please notify your doctor immediately.  FOLLOW-UP:  Please make an appointment with your surgeon as instructed. You do not need to follow up with anesthesia unless specifically instructed to do so.  WOUND CARE INSTRUCTIONS (if applicable):  Keep a dry clean dressing on the anesthesia/puncture wound site if there is drainage.  Once the wound has quit draining you may leave it open to air.  Generally you should leave the bandage intact for twenty four hours unless there is drainage.  If the epidural site drains for more than 36-48 hours please call the anesthesia department.  QUESTIONS?:  Please feel free to call your physician or the hospital operator if you have any questions, and they will be happy to assist you.

## 2017-10-21 NOTE — Interval H&P Note (Signed)
History and Physical Interval Note:  10/21/2017 8:42 AM  Timothy Parks  has presented today for surgery, with the diagnosis of laryngeal cancer  The various methods of treatment have been discussed with the patient and family. After consideration of risks, benefits and other options for treatment, the patient has consented to  Procedure(s): INSERTION PORT-A-CATH (N/A) as a surgical intervention .  The patient's history has been reviewed, patient examined, no change in status, stable for surgery.  I have reviewed the patient's chart and labs.  Questions were answered to the patient's satisfaction.    No questions. Will start on the left.     Virl Cagey

## 2017-10-21 NOTE — Anesthesia Preprocedure Evaluation (Signed)
Anesthesia Evaluation  Patient identified by MRN, date of birth, ID band Patient awake    Reviewed: Allergy & Precautions, NPO status , Patient's Chart, lab work & pertinent test results  Airway Mallampati: II  TM Distance: >3 FB Neck ROM: Full    Dental no notable dental hx.    Pulmonary COPD, Current Smoker,    Pulmonary exam normal breath sounds clear to auscultation       Cardiovascular hypertension, Normal cardiovascular exam Rhythm:Regular Rate:Normal     Neuro/Psych Anxiety    GI/Hepatic   Endo/Other    Renal/GU      Musculoskeletal   Abdominal   Peds  Hematology   Anesthesia Other Findings Supraglottic larynx cancer  Reproductive/Obstetrics                             Anesthesia Physical Anesthesia Plan  ASA: III  Anesthesia Plan: MAC   Post-op Pain Management:    Induction: Intravenous  PONV Risk Score and Plan:   Airway Management Planned: Nasal Cannula  Additional Equipment:   Intra-op Plan:   Post-operative Plan:   Informed Consent: I have reviewed the patients History and Physical, chart, labs and discussed the procedure including the risks, benefits and alternatives for the proposed anesthesia with the patient or authorized representative who has indicated his/her understanding and acceptance.     Plan Discussed with: CRNA  Anesthesia Plan Comments:         Anesthesia Quick Evaluation

## 2017-10-21 NOTE — Anesthesia Postprocedure Evaluation (Signed)
Anesthesia Post Note  Patient: Timothy Parks  Procedure(s) Performed: INSERTION PORT-A-CATH (Left Chest)  Patient location during evaluation: PACU Anesthesia Type: MAC Level of consciousness: awake and alert and patient cooperative Pain management: pain level controlled Vital Signs Assessment: post-procedure vital signs reviewed and stable Respiratory status: spontaneous breathing, nonlabored ventilation and respiratory function stable Cardiovascular status: blood pressure returned to baseline Postop Assessment: no apparent nausea or vomiting Anesthetic complications: no     Last Vitals:  Vitals:   10/21/17 0920 10/21/17 1015  BP:    Pulse:    Resp: 16   Temp:  (P) 36.8 C  SpO2: 97% (P) 97%    Last Pain:  Vitals:   10/21/17 0847  TempSrc: Oral  PainSc:                  Cabell Lazenby J

## 2017-10-21 NOTE — Progress Notes (Signed)
Attempted call to mother Geraldo Pitter at [phone]. Voice mail message to call extension [phone] in regards to Mr. Lapinsky left on voice mail.

## 2017-10-24 ENCOUNTER — Encounter (HOSPITAL_COMMUNITY): Payer: Self-pay | Admitting: General Surgery

## 2017-10-25 ENCOUNTER — Other Ambulatory Visit: Payer: Self-pay

## 2017-10-25 ENCOUNTER — Ambulatory Visit (HOSPITAL_COMMUNITY): Payer: Self-pay

## 2017-10-25 ENCOUNTER — Encounter (HOSPITAL_COMMUNITY): Payer: Self-pay

## 2017-10-25 ENCOUNTER — Inpatient Hospital Stay (HOSPITAL_COMMUNITY): Payer: Medicaid Other

## 2017-10-25 ENCOUNTER — Encounter: Payer: Self-pay | Admitting: General Practice

## 2017-10-25 VITALS — BP 122/72 | HR 84 | Temp 98.5°F | Resp 18 | Wt 206.7 lb

## 2017-10-25 DIAGNOSIS — C329 Malignant neoplasm of larynx, unspecified: Secondary | ICD-10-CM

## 2017-10-25 DIAGNOSIS — Z5111 Encounter for antineoplastic chemotherapy: Secondary | ICD-10-CM | POA: Diagnosis present

## 2017-10-25 LAB — COMPREHENSIVE METABOLIC PANEL
ALBUMIN: 4.3 g/dL (ref 3.5–5.0)
ALT: 21 U/L (ref 17–63)
ANION GAP: 13 (ref 5–15)
AST: 28 U/L (ref 15–41)
Alkaline Phosphatase: 89 U/L (ref 38–126)
BILIRUBIN TOTAL: 0.8 mg/dL (ref 0.3–1.2)
BUN: 10 mg/dL (ref 6–20)
CHLORIDE: 98 mmol/L — AB (ref 101–111)
CO2: 25 mmol/L (ref 22–32)
Calcium: 9.2 mg/dL (ref 8.9–10.3)
Creatinine, Ser: 1.05 mg/dL (ref 0.61–1.24)
GFR calc Af Amer: >60 mL/min (ref >60)
GFR calc non Af Amer: >60 mL/min (ref >60)
GLUCOSE: 111 mg/dL — AB (ref 65–99)
POTASSIUM: 4 mmol/L (ref 3.5–5.1)
SODIUM: 136 mmol/L (ref 135–145)
TOTAL PROTEIN: 7.8 g/dL (ref 6.5–8.1)

## 2017-10-25 LAB — CBC WITH DIFFERENTIAL/PLATELET
BASOS ABS: 0.1 10*3/uL (ref 0.0–0.1)
Basophils Relative: 0 %
Eosinophils Absolute: 0.1 10*3/uL (ref 0.0–0.7)
Eosinophils Relative: 1 %
HCT: 40.6 % (ref 39.0–52.0)
Hemoglobin: 13.1 g/dL (ref 13.0–17.0)
LYMPHS ABS: 3.5 10*3/uL (ref 0.7–4.0)
LYMPHS PCT: 26 %
MCH: 28.8 pg (ref 26.0–34.0)
MCHC: 32.3 g/dL (ref 30.0–36.0)
MCV: 89.2 fL (ref 78.0–100.0)
MONO ABS: 1.4 10*3/uL — AB (ref 0.1–1.0)
Monocytes Relative: 11 %
NEUTROS ABS: 8.3 10*3/uL — AB (ref 1.7–7.7)
Neutrophils Relative %: 62 %
Platelets: 336 10*3/uL (ref 150–400)
RBC: 4.55 MIL/uL (ref 4.22–5.81)
RDW: 12.6 % (ref 11.5–15.5)
WBC: 13.3 10*3/uL — ABNORMAL HIGH (ref 4.0–10.5)

## 2017-10-25 LAB — MAGNESIUM: MAGNESIUM: 1.9 mg/dL (ref 1.7–2.4)

## 2017-10-25 MED ORDER — SODIUM CHLORIDE 0.9 % IV SOLN
Freq: Once | INTRAVENOUS | Status: AC
  Administered 2017-10-25: 12:00:00 via INTRAVENOUS
  Filled 2017-10-25: qty 5

## 2017-10-25 MED ORDER — POTASSIUM CHLORIDE 2 MEQ/ML IV SOLN
Freq: Once | INTRAVENOUS | Status: DC
  Filled 2017-10-25: qty 10

## 2017-10-25 MED ORDER — HEPARIN SOD (PORK) LOCK FLUSH 100 UNIT/ML IV SOLN
500.0000 [IU] | Freq: Once | INTRAVENOUS | Status: AC | PRN
  Administered 2017-10-25: 500 [IU]

## 2017-10-25 MED ORDER — PALONOSETRON HCL INJECTION 0.25 MG/5ML
0.2500 mg | Freq: Once | INTRAVENOUS | Status: AC
  Administered 2017-10-25: 0.25 mg via INTRAVENOUS
  Filled 2017-10-25: qty 5

## 2017-10-25 MED ORDER — POTASSIUM CHLORIDE 2 MEQ/ML IV SOLN
Freq: Once | INTRAVENOUS | Status: AC
  Administered 2017-10-25: 10:00:00 via INTRAVENOUS
  Filled 2017-10-25: qty 10

## 2017-10-25 MED ORDER — SODIUM CHLORIDE 0.9 % IV SOLN
40.0000 mg/m2 | Freq: Once | INTRAVENOUS | Status: AC
  Administered 2017-10-25: 85 mg via INTRAVENOUS
  Filled 2017-10-25: qty 85

## 2017-10-25 MED ORDER — SODIUM CHLORIDE 0.9 % IV SOLN
Freq: Once | INTRAVENOUS | Status: AC
  Administered 2017-10-25: 12:00:00 via INTRAVENOUS

## 2017-10-25 NOTE — Progress Notes (Signed)
[facility] CSW Progress Notes  Spoke w patient, patient concerned that [facility] did not pick him up today as scheduled.  Was supposed to be picked up at home, taken to [facility] for radiation, then transported to [facility].  No evidence of [facility] attempt at pick up this morning, missed radiation appt as a result.  Came directly to [facility].  Spoke w dispatcher, states that "if we didn't bring him in, we cannot take him home."  Dispatcher refuses to schedule transport home from [facility] today.  CSW left VM for Rexene Alberts, Psychologist, clinical assist in resolving this issue.  Patient has [facility] scheduled for transport to ongoing radiation appts - is concerned that Lucianne Lei does not know where he lives as they were unable to come to his address this AM.  Per [facility], driver says they were at his address at 7:20 today, waiting 5 minutes for patient - [facility] considers him a "no show.:"  Awaiting return call to either CSW or nurse line.  Edwyna Shell, LCSW Clinical Social Worker Phone:  [phone]

## 2017-10-25 NOTE — Progress Notes (Signed)
Tolerated tx w/o adverse reaction.  Alert, in no distress.  VSS.  Discharged ambulatory in c/o family.  

## 2017-10-26 ENCOUNTER — Encounter (HOSPITAL_COMMUNITY): Payer: Self-pay | Admitting: *Deleted

## 2017-10-26 NOTE — Progress Notes (Signed)
Attempted to call pt for 24 hour f/u - no answer, and no answering machine or voice mail box to leave message.

## 2017-11-01 ENCOUNTER — Ambulatory Visit (HOSPITAL_COMMUNITY): Payer: Self-pay

## 2017-11-01 ENCOUNTER — Inpatient Hospital Stay (HOSPITAL_BASED_OUTPATIENT_CLINIC_OR_DEPARTMENT_OTHER): Payer: Medicaid Other | Admitting: Hematology

## 2017-11-01 ENCOUNTER — Encounter (HOSPITAL_COMMUNITY): Payer: Self-pay | Admitting: Hematology

## 2017-11-01 ENCOUNTER — Ambulatory Visit (HOSPITAL_COMMUNITY): Payer: Self-pay | Admitting: Hematology

## 2017-11-01 ENCOUNTER — Inpatient Hospital Stay (HOSPITAL_COMMUNITY): Payer: Medicaid Other

## 2017-11-01 ENCOUNTER — Encounter (HOSPITAL_COMMUNITY): Payer: Self-pay

## 2017-11-01 ENCOUNTER — Encounter: Payer: Self-pay | Admitting: General Practice

## 2017-11-01 VITALS — BP 111/76 | HR 78 | Temp 97.8°F | Resp 18 | Wt 207.8 lb

## 2017-11-01 DIAGNOSIS — C329 Malignant neoplasm of larynx, unspecified: Secondary | ICD-10-CM

## 2017-11-01 DIAGNOSIS — F17210 Nicotine dependence, cigarettes, uncomplicated: Secondary | ICD-10-CM | POA: Diagnosis not present

## 2017-11-01 LAB — COMPREHENSIVE METABOLIC PANEL
ALK PHOS: 76 U/L (ref 38–126)
ALT: 21 U/L (ref 17–63)
AST: 51 U/L — ABNORMAL HIGH (ref 15–41)
Albumin: 3.8 g/dL (ref 3.5–5.0)
Anion gap: 13 (ref 5–15)
BILIRUBIN TOTAL: 0.7 mg/dL (ref 0.3–1.2)
BUN: 14 mg/dL (ref 6–20)
CALCIUM: 9 mg/dL (ref 8.9–10.3)
CO2: 22 mmol/L (ref 22–32)
Chloride: 99 mmol/L — ABNORMAL LOW (ref 101–111)
Creatinine, Ser: 1.22 mg/dL (ref 0.61–1.24)
GFR calc non Af Amer: >60 mL/min (ref >60)
Glucose, Bld: 99 mg/dL (ref 65–99)
Potassium: 4.1 mmol/L (ref 3.5–5.1)
SODIUM: 134 mmol/L — AB (ref 135–145)
TOTAL PROTEIN: 7.2 g/dL (ref 6.5–8.1)

## 2017-11-01 LAB — CBC WITH DIFFERENTIAL/PLATELET
BASOS PCT: 0 %
Basophils Absolute: 0 10*3/uL (ref 0.0–0.1)
EOS ABS: 0.2 10*3/uL (ref 0.0–0.7)
EOS PCT: 1 %
HCT: 37.9 % — ABNORMAL LOW (ref 39.0–52.0)
HEMOGLOBIN: 12.5 g/dL — AB (ref 13.0–17.0)
LYMPHS ABS: 3.7 10*3/uL (ref 0.7–4.0)
Lymphocytes Relative: 25 %
MCH: 29.3 pg (ref 26.0–34.0)
MCHC: 33 g/dL (ref 30.0–36.0)
MCV: 88.8 fL (ref 78.0–100.0)
Monocytes Absolute: 1.3 10*3/uL — ABNORMAL HIGH (ref 0.1–1.0)
Monocytes Relative: 9 %
NEUTROS PCT: 65 %
Neutro Abs: 9.5 10*3/uL — ABNORMAL HIGH (ref 1.7–7.7)
Platelets: 284 10*3/uL (ref 150–400)
RBC: 4.27 MIL/uL (ref 4.22–5.81)
RDW: 12.4 % (ref 11.5–15.5)
WBC: 14.8 10*3/uL — AB (ref 4.0–10.5)

## 2017-11-01 LAB — MAGNESIUM: Magnesium: 1.8 mg/dL (ref 1.7–2.4)

## 2017-11-01 MED ORDER — SODIUM CHLORIDE 0.9 % IV SOLN
Freq: Once | INTRAVENOUS | Status: AC
  Administered 2017-11-01: 10:00:00 via INTRAVENOUS

## 2017-11-01 MED ORDER — SODIUM CHLORIDE 0.9 % IV SOLN
40.0000 mg/m2 | Freq: Once | INTRAVENOUS | Status: AC
  Administered 2017-11-01: 85 mg via INTRAVENOUS
  Filled 2017-11-01: qty 85

## 2017-11-01 MED ORDER — SODIUM CHLORIDE 0.9% FLUSH
10.0000 mL | INTRAVENOUS | Status: DC | PRN
  Administered 2017-11-01: 10 mL
  Filled 2017-11-01: qty 10

## 2017-11-01 MED ORDER — HEPARIN SOD (PORK) LOCK FLUSH 100 UNIT/ML IV SOLN
500.0000 [IU] | Freq: Once | INTRAVENOUS | Status: AC | PRN
  Administered 2017-11-01: 500 [IU]

## 2017-11-01 MED ORDER — SODIUM CHLORIDE 0.9 % IV SOLN
Freq: Once | INTRAVENOUS | Status: AC
  Administered 2017-11-01: 11:00:00 via INTRAVENOUS
  Filled 2017-11-01: qty 5

## 2017-11-01 MED ORDER — POTASSIUM CHLORIDE 2 MEQ/ML IV SOLN
Freq: Once | INTRAVENOUS | Status: AC
  Administered 2017-11-01: 10:00:00 via INTRAVENOUS
  Filled 2017-11-01: qty 10

## 2017-11-01 MED ORDER — PALONOSETRON HCL INJECTION 0.25 MG/5ML
0.2500 mg | Freq: Once | INTRAVENOUS | Status: AC
  Administered 2017-11-01: 0.25 mg via INTRAVENOUS
  Filled 2017-11-01: qty 5

## 2017-11-01 MED ORDER — SODIUM CHLORIDE 0.9 % IV SOLN
INTRAVENOUS | Status: DC
  Administered 2017-11-01: 14:00:00 via INTRAVENOUS

## 2017-11-01 NOTE — Progress Notes (Signed)
[address] [place], [place] [address]   CLINIC:  Medical Oncology/Hematology  PCP:  [facility], [place] [address] [phone]   REASON FOR VISIT:  Follow-up for laryngeal cancer.  CURRENT THERAPY: Weekly cisplatin.  BRIEF ONCOLOGIC HISTORY:    Laryngeal cancer ([place])   09/29/2017 Initial Diagnosis    Laryngeal cancer ([place])      10/23/2017 -  Chemotherapy    The patient had palonosetron (ALOXI) injection 0.25 mg, 0.25 mg, Intravenous,  Once, 2 of 7 cycles Administration: 0.25 mg (10/25/2017) CISplatin (PLATINOL) 85 mg in sodium chloride 0.9 % 250 mL chemo infusion, 40 mg/m2 = 85 mg, Intravenous,  Once, 2 of 7 cycles Administration: 85 mg (10/25/2017) fosaprepitant (EMEND) 150 mg, dexamethasone (DECADRON) 12 mg in sodium chloride 0.9 % 145 mL IVPB, , Intravenous,  Once, 2 of 7 cycles Administration:  (10/25/2017)  for chemotherapy treatment.         CANCER STAGING: Cancer Staging Laryngeal cancer [facility]) Staging form: Larynx - Glottis, AJCC 8th Edition - Clinical stage from 09/29/2017: Stage IVA (cT1a, cN2a, cM0) - Signed by Derek Jack, MD on 09/29/2017    INTERVAL HISTORY:  Mr. Guimond 57 y.o. male returns for routine follow-up and consideration for next cycle of chemotherapy.  He is due for week 2 of cisplatin.  He started chemotherapy last Tuesday and radiation therapy last Wednesday.  He denied any nausea or vomiting or diarrhea.  He denied any constipation.  No tingling or numbness in the extremities was noted.  He started feeling mild soreness in the throat mostly in the mornings.  He does not have any difficulty swallowing or painful swallowing.  He is able to eat all sorts of foods.  He is maintaining his weight.  He is drinking adequate fluids.  He denies any hospitalizations.   REVIEW OF SYSTEMS:  Review of Systems  Constitutional: Negative.   HENT:  Negative.        Complains of dry mouth.    Eyes: Negative.   Respiratory: Negative.   Cardiovascular: Negative.   Gastrointestinal: Negative.   Genitourinary: Negative.    Musculoskeletal: Negative.   Skin: Negative.   Neurological: Negative.   Hematological: Negative.   Psychiatric/Behavioral: Negative.      PAST MEDICAL/SURGICAL HISTORY:  Past Medical History:  Diagnosis Date  . Anxiety   . Cancer ([place])    laryngeal  . COPD (chronic obstructive pulmonary disease) ([place])    pt states he was told he has COPD by health department  . Hypertension   . 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 BLADDER SURGERY    . MICROLARYNGOSCOPY N/A 09/20/2017   Procedure: MICRO LARYNGOSCOPY WITH BIOPSY OF LARYNGEAL MASS;  Surgeon: Leta Baptist, MD;  Location: [facility];  Service: ENT;  Laterality: N/A;  . PORTACATH PLACEMENT Left 10/21/2017   Procedure: INSERTION PORT-A-CATH;  Surgeon: Virl Cagey, MD;  Location: [facility];  Service: General;  Laterality: Left;     SOCIAL HISTORY:  Social History   Socioeconomic History  . Marital status: Single    Spouse name: Not on file  . Number of children: Not on file  . Years of education: Not on file  . Highest education level: Not on file  Occupational History  . Not on file  Social Needs  . Financial resource strain: Not on file  . Food insecurity:    Worry: Not on  file    Inability: Not on file  . Transportation needs:    Medical: Not on file    Non-medical: Not on file  Tobacco Use  . Smoking status: Current Some Day Smoker    Packs/day: 0.00    Types: Cigarettes  . Smokeless tobacco: Current User    Types: Snuff  . Tobacco comment: 2 cigarettes with coffee in the morning  Substance and Sexual Activity  . Alcohol use: No    Frequency: Never    Comment: in recovery x since 2013  . Drug use: No  . Sexual activity: Not Currently  Lifestyle  . Physical activity:    Days per week: Not on file    Minutes per  session: Not on file  . Stress: Not on file  Relationships  . Social connections:    Talks on phone: Not on file    Gets together: Not on file    Attends religious service: Not on file    Active member of club or organization: Not on file    Attends meetings of clubs or organizations: Not on file    Relationship status: Not on file  . Intimate partner violence:    Fear of current or ex partner: Not on file    Emotionally abused: Not on file    Physically abused: Not on file    Forced sexual activity: Not on file  Other Topics Concern  . Not on file  Social History Narrative  . Not on file    FAMILY HISTORY:  Family History  Problem Relation Age of Onset  . Bladder Cancer Mother        11  . Colon cancer Father   . Thyroid disease Sister   . Heart disease Brother        stent placement  . Heart disease Maternal Grandmother   . Diabetes Maternal Grandfather   . Cancer Paternal Grandmother   . AAA (abdominal aortic aneurysm) Paternal Grandfather     CURRENT MEDICATIONS:  Outpatient Encounter Medications as of 11/01/2017  Medication Sig  . alum & mag hydroxide-simeth (MAALOX/MYLANTA) 200-200-20 MG/5ML suspension Take 30 mLs by mouth as needed for indigestion or heartburn.  Marland Kitchen amLODipine (NORVASC) 10 MG tablet Take 10 mg by mouth at bedtime.   . docusate sodium (COLACE) 100 MG capsule Take 1 capsule (100 mg total) by mouth 2 (two) times daily as needed for mild constipation.  Marland Kitchen doxylamine, Sleep, (UNISOM) 25 MG tablet Take 25 mg by mouth at bedtime as needed for sleep.  Marland Kitchen FLUoxetine (PROZAC) 20 MG tablet Take 60 mg by mouth daily.   Marland Kitchen lidocaine-prilocaine (EMLA) cream Apply to affected area once  . loratadine (CLARITIN) 10 MG tablet Take 10 mg by mouth daily as needed for allergies.  Marland Kitchen lovastatin (MEVACOR) 20 MG tablet Take 20 mg by mouth at bedtime.  . ondansetron (ZOFRAN) 8 MG tablet Take 1 tablet (8 mg total) by mouth 2 (two) times daily as needed. Start on the third day  after chemotherapy.  Marland Kitchen oxyCODONE (ROXICODONE) 5 MG immediate release tablet Take 1 tablet (5 mg total) by mouth every 4 (four) hours as needed for severe pain or breakthrough pain.  Marland Kitchen prochlorperazine (COMPAZINE) 10 MG tablet Take 1 tablet (10 mg total) by mouth every 6 (six) hours as needed (Nausea or vomiting).  . sodium fluoride (PREVIDENT 5000 PLUS) 1.1 % CREA dental cream Apply cream to tooth brush. Brush teeth for 2 minutes. Spit out excess. Repeat nightly.  Facility-Administered Encounter Medications as of 11/01/2017  Medication  . [COMPLETED] 0.9 %  sodium chloride infusion  . [COMPLETED] dextrose 5 % and 0.45% NaCl 1,000 mL with potassium chloride 20 mEq, magnesium sulfate 12 mEq infusion  . heparin lock flush 100 unit/mL  . sodium chloride flush (NS) 0.9 % injection 10 mL    ALLERGIES:  Allergies  Allergen Reactions  . Tetracyclines & Related Anxiety     PHYSICAL EXAM:  ECOG Performance status: 1  I have reviewed his vitals. Physical Exam  HENT:  Left neck lymph node has become softer.     LABORATORY DATA:  I have reviewed the labs as listed.  CBC    Component Value Date/Time   WBC 14.8 (H) 11/01/2017 0915   RBC 4.27 11/01/2017 0915   HGB 12.5 (L) 11/01/2017 0915   HCT 37.9 (L) 11/01/2017 0915   PLT 284 11/01/2017 0915   MCV 88.8 11/01/2017 0915   MCH 29.3 11/01/2017 0915   MCHC 33.0 11/01/2017 0915   RDW 12.4 11/01/2017 0915   LYMPHSABS 3.7 11/01/2017 0915   MONOABS 1.3 (H) 11/01/2017 0915   EOSABS 0.2 11/01/2017 0915   BASOSABS 0.0 11/01/2017 0915   CMP Latest Ref Rng & Units 11/01/2017 10/25/2017 10/17/2017  Glucose 65 - 99 mg/dL 99 111(H) 94  BUN 6 - 20 mg/dL 14 10 8   Creatinine 0.61 - 1.24 mg/dL 1.22 1.05 0.92  Sodium 135 - 145 mmol/L 134(L) 136 137  Potassium 3.5 - 5.1 mmol/L 4.1 4.0 3.9  Chloride 101 - 111 mmol/L 99(L) 98(L) 102  CO2 22 - 32 mmol/L 22 25 24   Calcium 8.9 - 10.3 mg/dL 9.0 9.2 9.4  Total Protein 6.5 - 8.1 g/dL 7.2 7.8 -  Total  Bilirubin 0.3 - 1.2 mg/dL 0.7 0.8 -  Alkaline Phos 38 - 126 U/L 76 89 -  AST 15 - 41 U/L 51(H) 28 -  ALT 17 - 63 U/L 21 21 -          ASSESSMENT & PLAN:   Laryngeal cancer ([facility]) 1.  Stage IV a (cT1a, cN2b,M0) laryngeal cancer: -Weekly cisplatin with radiation started on 10/25/2017 -He has tolerated first weekly dose of cisplatin very well without any major problems.  He denied any nausea or vomiting.  He has mild soreness in the throat in the mornings.  He will proceed with his week 2 of cisplatin with pre-and post hydration today.  His blood counts and electrolytes were within normal limits.  He was encouraged to drink plenty of fluids daily.  He will come back in 1 week for his next cycle.  Total time spent is 25 minutes with more than 50% of the time spent face-to-face discussing his treatment related side effects, counseling and prevention strategies.    Orders placed this encounter:  Orders Placed This Encounter  Procedures  . CBC with Differential  . Comprehensive metabolic panel  . Magnesium  . Comprehensive metabolic panel  . CBC with Differential  . Magnesium      Derek Jack, MD [place] [phone]

## 2017-11-01 NOTE — Assessment & Plan Note (Signed)
1.  Stage IV a (cT1a, cN2b,M0) laryngeal cancer: -Weekly cisplatin with radiation started on 10/25/2017 -He has tolerated first weekly dose of cisplatin very well without any major problems.  He denied any nausea or vomiting.  He has mild soreness in the throat in the mornings.  He will proceed with his week 2 of cisplatin with pre-and post hydration today.  His blood counts and electrolytes were within normal limits.  He was encouraged to drink plenty of fluids daily.  He will come back in 1 week for his next cycle.

## 2017-11-01 NOTE — Addendum Note (Signed)
Addended by: Derek Jack on: 11/01/2017 10:53 AM   Modules accepted: Orders

## 2017-11-01 NOTE — Progress Notes (Signed)
1030 Labs reviewed with and pt seen by Dr. Delton Coombes and pt approved for chemo tx today as well as order for 1 liter of hydration fluids  to be given over 2 hours prior to Cisplatin then 500 ml NS over 1 hour after Cisplatin per MD                                             [name] tolerated chemo tx with hydration well without complaints or issues. VSS upon discharge. Pt discharged self ambulatory in satisfactory condition
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
## 2017-11-01 NOTE — Patient Instructions (Addendum)
[facility] Discharge Instructions for Patients Receiving Chemotherapy   Beginning January 23rd 2017 lab work for the Cancer Center will be done in the  Main lab at [facility] on 1st floor. If you have a lab appointment with the Cancer Center please come in thru the  Main Entrance and check in at the main information desk   Today you received the following chemotherapy agents Cisplatin with hydration. Follow-up as scheduled. Call clinic for any questions or concerns  To help prevent nausea and vomiting after your treatment, we encourage you to take your nausea medication   If you develop nausea and vomiting, or diarrhea that is not controlled by your medication, call the clinic.  The clinic phone number is [phone]. Office hours are Monday-Friday 8:30am-5:00pm.  BELOW ARE SYMPTOMS THAT SHOULD BE REPORTED IMMEDIATELY:  *FEVER GREATER THAN 101.0 F  *CHILLS WITH OR WITHOUT FEVER  NAUSEA AND VOMITING THAT IS NOT CONTROLLED WITH YOUR NAUSEA MEDICATION  *UNUSUAL SHORTNESS OF BREATH  *UNUSUAL BRUISING OR BLEEDING  TENDERNESS IN MOUTH AND THROAT WITH OR WITHOUT PRESENCE OF ULCERS  *URINARY PROBLEMS  *BOWEL PROBLEMS  UNUSUAL RASH Items with * indicate a potential emergency and should be followed up as soon as possible. If you have an emergency after office hours please contact your primary care physician or go to the nearest emergency department.  Please call the clinic during office hours if you have any questions or concerns.   You may also contact the Patient Navigator at [phone] should you have any questions or need assistance in obtaining follow up care.      Resources For Cancer Patients and their Caregivers ? American Cancer Society: Can assist with transportation, wigs, general needs, runs Look Good Feel Better.        1-888-227-6333 ? Cancer Care: Provides financial assistance, online support groups, medication/co-pay assistance.   1-800-813-HOPE (4673) ? [facility] Assists [place] cancer patients and their families through emotional , educational and financial support.  336-427-4357 ? [place] DSS Where to apply for food stamps, Medicaid and utility assistance. 336-342-1394 ? [facility]: Transportation to medical appointments. 336-347-2287 ? Social Security Administration: May apply for disability if have a Stage IV cancer. 336-342-7796 1-800-772-1213 ? [place] Aging, Disability and Transit Services: Assists with nutrition, care and transit needs. 336-349-2343         

## 2017-11-02 ENCOUNTER — Encounter: Payer: Self-pay | Admitting: General Practice

## 2017-11-02 NOTE — Progress Notes (Signed)
[facility] CSW Progress Notes  Patient referred to [facility] Lattie [facility] [place] [phone])  for help w psychosocial issues in the community.  Provider will contact patient in the community to assess needs and address issues related to housing and any other concerns.  Edwyna Shell, LCSW Clinical Social Worker Phone:  [phone]

## 2017-11-08 ENCOUNTER — Inpatient Hospital Stay (HOSPITAL_COMMUNITY): Payer: Medicaid Other

## 2017-11-08 ENCOUNTER — Encounter: Payer: Self-pay | Admitting: General Practice

## 2017-11-08 VITALS — BP 135/76 | HR 77 | Temp 98.1°F | Resp 20 | Wt 206.4 lb

## 2017-11-08 DIAGNOSIS — C329 Malignant neoplasm of larynx, unspecified: Secondary | ICD-10-CM | POA: Diagnosis not present

## 2017-11-08 LAB — CBC WITH DIFFERENTIAL/PLATELET
BASOS ABS: 0 10*3/uL (ref 0.0–0.1)
BASOS PCT: 0 %
EOS ABS: 0.1 10*3/uL (ref 0.0–0.7)
Eosinophils Relative: 1 %
HEMATOCRIT: 34.9 % — AB (ref 39.0–52.0)
HEMOGLOBIN: 11.6 g/dL — AB (ref 13.0–17.0)
Lymphocytes Relative: 21 %
Lymphs Abs: 2.2 10*3/uL (ref 0.7–4.0)
MCH: 29.5 pg (ref 26.0–34.0)
MCHC: 33.2 g/dL (ref 30.0–36.0)
MCV: 88.8 fL (ref 78.0–100.0)
MONOS PCT: 11 %
Monocytes Absolute: 1.1 10*3/uL — ABNORMAL HIGH (ref 0.1–1.0)
NEUTROS ABS: 6.8 10*3/uL (ref 1.7–7.7)
NEUTROS PCT: 67 %
Platelets: 254 10*3/uL (ref 150–400)
RBC: 3.93 MIL/uL — ABNORMAL LOW (ref 4.22–5.81)
RDW: 12.3 % (ref 11.5–15.5)
WBC: 10.2 10*3/uL (ref 4.0–10.5)

## 2017-11-08 LAB — COMPREHENSIVE METABOLIC PANEL
ALBUMIN: 3.8 g/dL (ref 3.5–5.0)
ALK PHOS: 72 U/L (ref 38–126)
ALT: 25 U/L (ref 17–63)
AST: 25 U/L (ref 15–41)
Anion gap: 12 (ref 5–15)
BILIRUBIN TOTAL: 0.8 mg/dL (ref 0.3–1.2)
BUN: 14 mg/dL (ref 6–20)
CALCIUM: 9.2 mg/dL (ref 8.9–10.3)
CO2: 24 mmol/L (ref 22–32)
CREATININE: 0.99 mg/dL (ref 0.61–1.24)
Chloride: 99 mmol/L — ABNORMAL LOW (ref 101–111)
GFR calc Af Amer: >60 mL/min (ref >60)
GFR calc non Af Amer: >60 mL/min (ref >60)
GLUCOSE: 99 mg/dL (ref 65–99)
Potassium: 4 mmol/L (ref 3.5–5.1)
SODIUM: 135 mmol/L (ref 135–145)
TOTAL PROTEIN: 6.9 g/dL (ref 6.5–8.1)

## 2017-11-08 LAB — MAGNESIUM: Magnesium: 1.8 mg/dL (ref 1.7–2.4)

## 2017-11-08 MED ORDER — SODIUM CHLORIDE 0.9 % IV SOLN
Freq: Once | INTRAVENOUS | Status: AC
  Administered 2017-11-08: 14:00:00 via INTRAVENOUS
  Filled 2017-11-08: qty 5

## 2017-11-08 MED ORDER — SODIUM CHLORIDE 0.9% FLUSH
10.0000 mL | INTRAVENOUS | Status: DC | PRN
  Administered 2017-11-08: 10 mL
  Filled 2017-11-08: qty 10

## 2017-11-08 MED ORDER — POTASSIUM CHLORIDE 2 MEQ/ML IV SOLN
Freq: Once | INTRAVENOUS | Status: AC
  Administered 2017-11-08: 12:00:00 via INTRAVENOUS
  Filled 2017-11-08: qty 10

## 2017-11-08 MED ORDER — PALONOSETRON HCL INJECTION 0.25 MG/5ML
0.2500 mg | Freq: Once | INTRAVENOUS | Status: AC
  Administered 2017-11-08: 0.25 mg via INTRAVENOUS
  Filled 2017-11-08: qty 5

## 2017-11-08 MED ORDER — HEPARIN SOD (PORK) LOCK FLUSH 100 UNIT/ML IV SOLN
500.0000 [IU] | Freq: Once | INTRAVENOUS | Status: AC | PRN
  Administered 2017-11-08: 500 [IU]
  Filled 2017-11-08: qty 5

## 2017-11-08 MED ORDER — SODIUM CHLORIDE 0.9 % IV SOLN
INTRAVENOUS | Status: DC
  Administered 2017-11-08: 14:00:00 via INTRAVENOUS

## 2017-11-08 MED ORDER — SODIUM CHLORIDE 0.9 % IV SOLN
40.0000 mg/m2 | Freq: Once | INTRAVENOUS | Status: AC
  Administered 2017-11-08: 85 mg via INTRAVENOUS
  Filled 2017-11-08: qty 85
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
## 2017-11-08 NOTE — Progress Notes (Signed)
Labs reviewed with MD. Proceed with treatment.  Treatment given per orders. Patient tolerated it well without problems. Vitals stable and discharged home from clinic ambulatory. Follow up as scheduled.  

## 2017-11-09 ENCOUNTER — Encounter (HOSPITAL_COMMUNITY): Payer: Self-pay

## 2017-11-09 ENCOUNTER — Encounter: Payer: Self-pay | Admitting: General Practice

## 2017-11-09 NOTE — Patient Instructions (Signed)
[facility] Discharge Instructions for Patients Receiving Chemotherapy   Beginning January 23rd 2017 lab work for the Cancer Center will be done in the  Main lab at [facility] on 1st floor. If you have a lab appointment with the Cancer Center please come in thru the  Main Entrance and check in at the main information desk   Today you received the following chemotherapy agents   To help prevent nausea and vomiting after your treatment, we encourage you to take your nausea medication     If you develop nausea and vomiting, or diarrhea that is not controlled by your medication, call the clinic.  The clinic phone number is [phone]. Office hours are Monday-Friday 8:30am-5:00pm.  BELOW ARE SYMPTOMS THAT SHOULD BE REPORTED IMMEDIATELY:  *FEVER GREATER THAN 101.0 F  *CHILLS WITH OR WITHOUT FEVER  NAUSEA AND VOMITING THAT IS NOT CONTROLLED WITH YOUR NAUSEA MEDICATION  *UNUSUAL SHORTNESS OF BREATH  *UNUSUAL BRUISING OR BLEEDING  TENDERNESS IN MOUTH AND THROAT WITH OR WITHOUT PRESENCE OF ULCERS  *URINARY PROBLEMS  *BOWEL PROBLEMS  UNUSUAL RASH Items with * indicate a potential emergency and should be followed up as soon as possible. If you have an emergency after office hours please contact your primary care physician or go to the nearest emergency department.  Please call the clinic during office hours if you have any questions or concerns.   You may also contact the Patient Navigator at [phone] should you have any questions or need assistance in obtaining follow up care.      Resources For Cancer Patients and their Caregivers ? American Cancer Society: Can assist with transportation, wigs, general needs, runs Look Good Feel Better.        1-888-227-6333 ? Cancer Care: Provides financial assistance, online support groups, medication/co-pay assistance.  1-800-813-HOPE (4673) ? [facility] Assists [place] cancer  patients and their families through emotional , educational and financial support.  336-427-4357 ? [place] DSS Where to apply for food stamps, Medicaid and utility assistance. 336-342-1394 ? [facility]: Transportation to medical appointments. 336-347-2287 ? Social Security Administration: May apply for disability if have a Stage IV cancer. 336-342-7796 1-800-772-1213 ? [place] Aging, Disability and Transit Services: Assists with nutrition, care and transit needs. 336-349-2343         

## 2017-11-15 ENCOUNTER — Encounter (HOSPITAL_COMMUNITY): Payer: Self-pay

## 2017-11-15 ENCOUNTER — Encounter (HOSPITAL_COMMUNITY): Payer: Self-pay | Admitting: Hematology

## 2017-11-15 ENCOUNTER — Inpatient Hospital Stay (HOSPITAL_COMMUNITY): Payer: Medicaid Other

## 2017-11-15 ENCOUNTER — Inpatient Hospital Stay (HOSPITAL_BASED_OUTPATIENT_CLINIC_OR_DEPARTMENT_OTHER): Payer: Medicaid Other | Admitting: Hematology

## 2017-11-15 VITALS — BP 98/70 | HR 88 | Temp 98.0°F | Resp 20 | Wt 199.8 lb

## 2017-11-15 DIAGNOSIS — I10 Essential (primary) hypertension: Secondary | ICD-10-CM

## 2017-11-15 DIAGNOSIS — G620 Drug-induced polyneuropathy: Secondary | ICD-10-CM

## 2017-11-15 DIAGNOSIS — G629 Polyneuropathy, unspecified: Secondary | ICD-10-CM

## 2017-11-15 DIAGNOSIS — R197 Diarrhea, unspecified: Secondary | ICD-10-CM

## 2017-11-15 DIAGNOSIS — C329 Malignant neoplasm of larynx, unspecified: Secondary | ICD-10-CM | POA: Diagnosis not present

## 2017-11-15 DIAGNOSIS — R634 Abnormal weight loss: Secondary | ICD-10-CM | POA: Diagnosis not present

## 2017-11-15 DIAGNOSIS — R110 Nausea: Secondary | ICD-10-CM | POA: Diagnosis not present

## 2017-11-15 DIAGNOSIS — T451X5A Adverse effect of antineoplastic and immunosuppressive drugs, initial encounter: Secondary | ICD-10-CM | POA: Diagnosis not present

## 2017-11-15 DIAGNOSIS — R1310 Dysphagia, unspecified: Secondary | ICD-10-CM | POA: Diagnosis not present

## 2017-11-15 DIAGNOSIS — F17210 Nicotine dependence, cigarettes, uncomplicated: Secondary | ICD-10-CM

## 2017-11-15 LAB — CBC WITH DIFFERENTIAL/PLATELET
BASOS ABS: 0 10*3/uL (ref 0.0–0.1)
BASOS PCT: 0 %
Eosinophils Absolute: 0.1 10*3/uL (ref 0.0–0.7)
Eosinophils Relative: 1 %
HEMATOCRIT: 34.1 % — AB (ref 39.0–52.0)
Hemoglobin: 11 g/dL — ABNORMAL LOW (ref 13.0–17.0)
LYMPHS PCT: 21 %
Lymphs Abs: 1.6 10*3/uL (ref 0.7–4.0)
MCH: 28.6 pg (ref 26.0–34.0)
MCHC: 32.3 g/dL (ref 30.0–36.0)
MCV: 88.8 fL (ref 78.0–100.0)
Monocytes Absolute: 0.9 10*3/uL (ref 0.1–1.0)
Monocytes Relative: 12 %
NEUTROS ABS: 5 10*3/uL (ref 1.7–7.7)
Neutrophils Relative %: 66 %
PLATELETS: 241 10*3/uL (ref 150–400)
RBC: 3.84 MIL/uL — ABNORMAL LOW (ref 4.22–5.81)
RDW: 12.2 % (ref 11.5–15.5)
WBC: 7.6 10*3/uL (ref 4.0–10.5)

## 2017-11-15 LAB — COMPREHENSIVE METABOLIC PANEL
ALK PHOS: 73 U/L (ref 38–126)
ALT: 21 U/L (ref 17–63)
ANION GAP: 12 (ref 5–15)
AST: 22 U/L (ref 15–41)
Albumin: 3.9 g/dL (ref 3.5–5.0)
BILIRUBIN TOTAL: 0.8 mg/dL (ref 0.3–1.2)
BUN: 14 mg/dL (ref 6–20)
CALCIUM: 9.2 mg/dL (ref 8.9–10.3)
CO2: 25 mmol/L (ref 22–32)
Chloride: 98 mmol/L — ABNORMAL LOW (ref 101–111)
Creatinine, Ser: 1.02 mg/dL (ref 0.61–1.24)
Glucose, Bld: 110 mg/dL — ABNORMAL HIGH (ref 65–99)
Potassium: 3.9 mmol/L (ref 3.5–5.1)
Sodium: 135 mmol/L (ref 135–145)
TOTAL PROTEIN: 7.4 g/dL (ref 6.5–8.1)

## 2017-11-15 LAB — MAGNESIUM: Magnesium: 1.8 mg/dL (ref 1.7–2.4)

## 2017-11-15 MED ORDER — POTASSIUM CHLORIDE 2 MEQ/ML IV SOLN
Freq: Once | INTRAVENOUS | Status: AC
  Administered 2017-11-15: 10:00:00 via INTRAVENOUS
  Filled 2017-11-15: qty 10

## 2017-11-15 MED ORDER — PALONOSETRON HCL INJECTION 0.25 MG/5ML
0.2500 mg | Freq: Once | INTRAVENOUS | Status: AC
  Administered 2017-11-15: 0.25 mg via INTRAVENOUS
  Filled 2017-11-15: qty 5

## 2017-11-15 MED ORDER — GABAPENTIN 300 MG PO CAPS: 300.0000 mg | ORAL_CAPSULE | Freq: Three times a day (TID) | ORAL | 1 refills | Status: DC

## 2017-11-15 MED ORDER — HEPARIN SOD (PORK) LOCK FLUSH 100 UNIT/ML IV SOLN
INTRAVENOUS | Status: AC
  Filled 2017-11-15: qty 5

## 2017-11-15 MED ORDER — SODIUM CHLORIDE 0.9 % IV SOLN
INTRAVENOUS | Status: DC
  Administered 2017-11-15: 13:00:00 via INTRAVENOUS

## 2017-11-15 MED ORDER — SODIUM CHLORIDE 0.9 % IV SOLN
32.0000 mg/m2 | Freq: Once | INTRAVENOUS | Status: AC
  Administered 2017-11-15: 68 mg via INTRAVENOUS
  Filled 2017-11-15: qty 68

## 2017-11-15 MED ORDER — SODIUM CHLORIDE 0.9% FLUSH
10.0000 mL | INTRAVENOUS | Status: DC | PRN
  Administered 2017-11-15: 10 mL
  Filled 2017-11-15: qty 10

## 2017-11-15 MED ORDER — SODIUM CHLORIDE 0.9 % IV SOLN
Freq: Once | INTRAVENOUS | Status: AC
  Administered 2017-11-15: 11:00:00 via INTRAVENOUS
  Filled 2017-11-15: qty 5

## 2017-11-15 MED ORDER — HEPARIN SOD (PORK) LOCK FLUSH 100 UNIT/ML IV SOLN
500.0000 [IU] | Freq: Once | INTRAVENOUS | Status: AC | PRN
  Administered 2017-11-15: 500 [IU]

## 2017-11-15 MED ORDER — SODIUM CHLORIDE 0.9 % IV SOLN
INTRAVENOUS | Status: DC
  Administered 2017-11-15: 09:00:00 via INTRAVENOUS

## 2017-11-15 NOTE — Assessment & Plan Note (Signed)
1.  Stage IV a (cT1a, cN2b,M0) laryngeal cancer: -Weekly cisplatin with radiation started on 10/25/2017 - He is here for week 4 of cisplatin.  He developed dysphagia about a week ago.  He was started on oxycodone 5 mg 1 to 2 tablets every 4-6 hours as needed by Dr. Quitman Livings.  He complained of tingling and burning sensation in the left foot bottom for the last 10 days.  It is limiting his ambulation.  He had some nausea but denied any vomiting.  He is able to eat soft foods like pudding, applesauce and green peas.  He is drinking about 2 ensures per day.  He lost about 45 pounds from last week.  He was encouraged to increase Ensure to 3 to 4 cans/day.  It is giving him slight diarrhea.  I have asked him to discontinue stool softener.  He will proceed with week for today.  We will reassess him in 1 week.  2.  Peripheral neuropathy: He has developed numbness and burning sensation in the bottom of the left foot which sometimes extends to the back of the left leg.  I have cut back on cisplatin dose by 20%.  I have sent a prescription for gabapentin 300 mg 3 times a day.

## 2017-11-15 NOTE — Progress Notes (Signed)
[address] [place], [place] [address]   CLINIC:  Medical Oncology/Hematology  PCP:  [facility], [place] [address] [phone]   REASON FOR VISIT:  Follow-up for laryngeal cancer.  CURRENT THERAPY: Weekly cisplatin.  BRIEF ONCOLOGIC HISTORY:    Laryngeal cancer ([place])   09/29/2017 Initial Diagnosis    Laryngeal cancer ([facility])      10/23/2017 -  Chemotherapy    The patient had palonosetron (ALOXI) injection 0.25 mg, 0.25 mg, Intravenous,  Once, 4 of 7 cycles Administration: 0.25 mg (10/25/2017), 0.25 mg (11/01/2017), 0.25 mg (11/08/2017) CISplatin (PLATINOL) 85 mg in sodium chloride 0.9 % 250 mL chemo infusion, 40 mg/m2 = 85 mg, Intravenous,  Once, 4 of 7 cycles Dose modification: 32 mg/m2 (80 % of original dose 40 mg/m2, Cycle 4, Reason: Other (see comments), Comment: neuropathy) Administration: 85 mg (10/25/2017), 85 mg (11/01/2017), 85 mg (11/08/2017) fosaprepitant (EMEND) 150 mg, dexamethasone (DECADRON) 12 mg in sodium chloride 0.9 % 145 mL IVPB, , Intravenous,  Once, 4 of 7 cycles Administration:  (10/25/2017),  (11/01/2017),  (11/08/2017)  for chemotherapy treatment.         CANCER STAGING: Cancer Staging Laryngeal cancer [facility]) Staging form: Larynx - Glottis, AJCC 8th Edition - Clinical stage from 09/29/2017: Stage IVA (cT1a, cN2a, cM0) - Signed by Derek Jack, MD on 09/29/2017    INTERVAL HISTORY:  Mr. Kupper 57 y.o. male returns for routine follow-up and consideration for next cycle of chemotherapy.  He is due for week 4 of cisplatin.  He has developed pain in the back of the throat while swallowing for the last 1 week.  He was evaluated by Dr. Quitman Livings who gave him oxycodone.  He is taking 5 mg tablet every 4 hours as needed.  He also developed left foot tingling in the bottom for the last 10 days.  He lost about 5 pounds since last week.  He had some nausea but denied any vomiting.  He is able to eat  soft foods like pudding, applesauce, soft green peas, Ensure up to 2 cans/day.  He gets watery diarrhea once in the mornings when he drinks Ensure.  He is also taking stool softener.   REVIEW OF SYSTEMS:  Review of Systems  Constitutional: Negative.   HENT:  Negative.        Complains of dry mouth.  Eyes: Negative.   Respiratory: Negative.   Cardiovascular: Negative.   Gastrointestinal: Negative.   Genitourinary: Negative.    Musculoskeletal: Negative.   Skin: Negative.   Neurological: Negative.   Hematological: Negative.   Psychiatric/Behavioral: Negative.   Complains of pain in the back of the throat while swallowing.   PAST MEDICAL/SURGICAL HISTORY:  Past Medical History:  Diagnosis Date  . Anxiety   . Cancer ([facility])    laryngeal  . COPD (chronic obstructive pulmonary disease) ([facility])    pt states he was told he has COPD by health department  . Hypertension   . 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 BLADDER SURGERY    . MICROLARYNGOSCOPY N/A 09/20/2017   Procedure: MICRO LARYNGOSCOPY WITH BIOPSY OF LARYNGEAL MASS;  Surgeon: Leta Baptist, MD;  Location: [facility];  Service: ENT;  Laterality: N/A;  . PORTACATH PLACEMENT Left 10/21/2017   Procedure: INSERTION PORT-A-CATH;  Surgeon: Virl Cagey, MD;  Location: [facility];  Service: General;  Laterality: Left;     SOCIAL HISTORY:  Social  History   Socioeconomic History  . Marital status: Single    Spouse name: Not on file  . Number of children: Not on file  . Years of education: Not on file  . Highest education level: Not on file  Occupational History  . Not on file  Social Needs  . Financial resource strain: Not on file  . Food insecurity:    Worry: Not on file    Inability: Not on file  . Transportation needs:    Medical: Not on file    Non-medical: Not on file  Tobacco Use  . Smoking status: Current Some Day Smoker    Packs/day: 0.00    Types:  Cigarettes  . Smokeless tobacco: Current User    Types: Snuff  . Tobacco comment: 2 cigarettes with coffee in the morning  Substance and Sexual Activity  . Alcohol use: No    Frequency: Never    Comment: in recovery x since 2013  . Drug use: No  . Sexual activity: Not Currently  Lifestyle  . Physical activity:    Days per week: Not on file    Minutes per session: Not on file  . Stress: Not on file  Relationships  . Social connections:    Talks on phone: Not on file    Gets together: Not on file    Attends religious service: Not on file    Active member of club or organization: Not on file    Attends meetings of clubs or organizations: Not on file    Relationship status: Not on file  . Intimate partner violence:    Fear of current or ex partner: Not on file    Emotionally abused: Not on file    Physically abused: Not on file    Forced sexual activity: Not on file  Other Topics Concern  . Not on file  Social History Narrative  . Not on file    FAMILY HISTORY:  Family History  Problem Relation Age of Onset  . Bladder Cancer Mother        75  . Colon cancer Father   . Thyroid disease Sister   . Heart disease Brother        stent placement  . Heart disease Maternal Grandmother   . Diabetes Maternal Grandfather   . Cancer Paternal Grandmother   . AAA (abdominal aortic aneurysm) Paternal Grandfather     CURRENT MEDICATIONS:  Outpatient Encounter Medications as of 11/15/2017  Medication Sig  . alum & mag hydroxide-simeth (MAALOX/MYLANTA) 200-200-20 MG/5ML suspension Take 30 mLs by mouth as needed for indigestion or heartburn.  Marland Kitchen amLODipine (NORVASC) 10 MG tablet Take 10 mg by mouth at bedtime.   . docusate sodium (COLACE) 100 MG capsule Take 1 capsule (100 mg total) by mouth 2 (two) times daily as needed for mild constipation.  Marland Kitchen doxylamine, Sleep, (UNISOM) 25 MG tablet Take 25 mg by mouth at bedtime as needed for sleep.  Marland Kitchen FLUoxetine (PROZAC) 20 MG tablet Take 60 mg  by mouth daily.   Marland Kitchen gabapentin (NEURONTIN) 300 MG capsule Take 1 capsule (300 mg total) by mouth 3 (three) times daily.  Marland Kitchen lidocaine-prilocaine (EMLA) cream Apply to affected area once  . loratadine (CLARITIN) 10 MG tablet Take 10 mg by mouth daily as needed for allergies.  Marland Kitchen lovastatin (MEVACOR) 20 MG tablet Take 20 mg by mouth at bedtime.  . ondansetron (ZOFRAN) 8 MG tablet Take 1 tablet (8 mg total) by mouth 2 (two) times daily  as needed. Start on the third day after chemotherapy.  Marland Kitchen oxyCODONE (ROXICODONE) 5 MG immediate release tablet Take 1 tablet (5 mg total) by mouth every 4 (four) hours as needed for severe pain or breakthrough pain.  Marland Kitchen prochlorperazine (COMPAZINE) 10 MG tablet Take 1 tablet (10 mg total) by mouth every 6 (six) hours as needed (Nausea or vomiting).  . sodium fluoride (PREVIDENT 5000 PLUS) 1.1 % CREA dental cream Apply cream to tooth brush. Brush teeth for 2 minutes. Spit out excess. Repeat nightly.   No facility-administered encounter medications on file as of 11/15/2017.     ALLERGIES:  Allergies  Allergen Reactions  . Tetracyclines & Related Anxiety     PHYSICAL EXAM:  ECOG Performance status: 1  I have reviewed his vitals. Physical Exam  HENT:  Left neck lymph node has become softer.     LABORATORY DATA:  I have reviewed the labs as listed.  CBC    Component Value Date/Time   WBC 7.6 11/15/2017 0847   RBC 3.84 (L) 11/15/2017 0847   HGB 11.0 (L) 11/15/2017 0847   HCT 34.1 (L) 11/15/2017 0847   PLT 241 11/15/2017 0847   MCV 88.8 11/15/2017 0847   MCH 28.6 11/15/2017 0847   MCHC 32.3 11/15/2017 0847   RDW 12.2 11/15/2017 0847   LYMPHSABS 1.6 11/15/2017 0847   MONOABS 0.9 11/15/2017 0847   EOSABS 0.1 11/15/2017 0847   BASOSABS 0.0 11/15/2017 0847   CMP Latest Ref Rng & Units 11/15/2017 11/08/2017 11/01/2017  Glucose 65 - 99 mg/dL 110(H) 99 99  BUN 6 - 20 mg/dL 14 14 14   Creatinine 0.61 - 1.24 mg/dL 1.02 0.99 1.22  Sodium 135 - 145 mmol/L 135  135 134(L)  Potassium 3.5 - 5.1 mmol/L 3.9 4.0 4.1  Chloride 101 - 111 mmol/L 98(L) 99(L) 99(L)  CO2 22 - 32 mmol/L 25 24 22   Calcium 8.9 - 10.3 mg/dL 9.2 9.2 9.0  Total Protein 6.5 - 8.1 g/dL 7.4 6.9 7.2  Total Bilirubin 0.3 - 1.2 mg/dL 0.8 0.8 0.7  Alkaline Phos 38 - 126 U/L 73 72 76  AST 15 - 41 U/L 22 25 51(H)  ALT 17 - 63 U/L 21 25 21           ASSESSMENT & PLAN:   Laryngeal cancer ([facility]) 1.  Stage IV a (cT1a, cN2b,M0) laryngeal cancer: -Weekly cisplatin with radiation started on 10/25/2017 - He is here for week 4 of cisplatin.  He developed dysphagia about a week ago.  He was started on oxycodone 5 mg 1 to 2 tablets every 4-6 hours as needed by Dr. Quitman Livings.  He complained of tingling and burning sensation in the left foot bottom for the last 10 days.  It is limiting his ambulation.  He had some nausea but denied any vomiting.  He is able to eat soft foods like pudding, applesauce and green peas.  He is drinking about 2 ensures per day.  He lost about 45 pounds from last week.  He was encouraged to increase Ensure to 3 to 4 cans/day.  It is giving him slight diarrhea.  I have asked him to discontinue stool softener.  He will proceed with week for today.  We will reassess him in 1 week.  2.  Peripheral neuropathy: He has developed numbness and burning sensation in the bottom of the left foot which sometimes extends to the back of the left leg.  I have cut back on cisplatin dose by 20%.  I  have sent a prescription for gabapentin 300 mg 3 times a day.  Total time spent is 25 minutes with more than 50% of the time spent face-to-face discussing his treatment related side effects, counseling and prevention strategies.    This note includes documentation from Mike Craze, NP, who was present during this patient's office visit and evaluation.  I have reviewed this note for its completeness and accuracy.  I have edited this note accordingly based on my findings and medical opinion.       Derek Jack, MD [place] [phone]

## 2017-11-15 NOTE — Patient Instructions (Signed)
[facility] Discharge Instructions  Today you saw Dr. K.   Thank you for choosing [facility] at [facility] to provide your oncology and hematology care.  To afford each patient quality time with our provider, please arrive at least 15 minutes before your scheduled appointment time.   If you have a lab appointment with the Cancer Center please come in thru the  Main Entrance and check in at the main information desk  You need to re-schedule your appointment should you arrive 10 or more minutes late.  We strive to give you quality time with our providers, and arriving late affects you and other patients whose appointments are after yours.  Also, if you no show three or more times for appointments you may be dismissed from the clinic at the providers discretion.     Again, thank you for choosing [facility].  Our hope is that these requests will decrease the amount of time that you wait before being seen by our physicians.       _____________________________________________________________  Should you have questions after your visit to [facility], please contact our office at [phone] between the hours of 8:30 a.m. and 4:30 p.m.  Voicemails left after 4:30 p.m. will not be returned until the following business day.  For prescription refill requests, have your pharmacy contact our office.       Resources For Cancer Patients and their Caregivers ? American Cancer Society: Can assist with transportation, wigs, general needs, runs Look Good Feel Better.        1-888-227-6333 ? Cancer Care: Provides financial assistance, online support groups, medication/co-pay assistance.  1-800-813-HOPE (4673) ? [facility] Assists [place] cancer patients and their families through emotional , educational and financial support.  336-427-4357 ? [place] DSS Where to apply for food  stamps, Medicaid and utility assistance. 336-342-1394 ? [facility]: Transportation to medical appointments. 336-347-2287 ? Social Security Administration: May apply for disability if have a Stage IV cancer. 336-342-7796 1-800-772-1213 ? [place] Aging, Disability and Transit Services: Assists with nutrition, care and transit needs. 336-349-2343  Cancer Center Support Programs:   > Cancer Support Group  2nd Tuesday of the month 1pm-2pm, Journey Room   > Creative Journey  3rd Tuesday of the month 1130am-1pm, Journey Room    

## 2017-11-15 NOTE — Patient Instructions (Signed)
[facility] Discharge Instructions for Patients Receiving Chemotherapy   Beginning January 23rd 2017 lab work for the Cancer Center will be done in the  Main lab at [facility] on 1st floor. If you have a lab appointment with the Cancer Center please come in thru the  Main Entrance and check in at the main information desk   Today you received the following chemotherapy agents Cisplatin.Follow-up as scheduled. Call clinic for any questions or concerns  To help prevent nausea and vomiting after your treatment, we encourage you to take your nausea medication   If you develop nausea and vomiting, or diarrhea that is not controlled by your medication, call the clinic.  The clinic phone number is [phone]. Office hours are Monday-Friday 8:30am-5:00pm.  BELOW ARE SYMPTOMS THAT SHOULD BE REPORTED IMMEDIATELY:  *FEVER GREATER THAN 101.0 F  *CHILLS WITH OR WITHOUT FEVER  NAUSEA AND VOMITING THAT IS NOT CONTROLLED WITH YOUR NAUSEA MEDICATION  *UNUSUAL SHORTNESS OF BREATH  *UNUSUAL BRUISING OR BLEEDING  TENDERNESS IN MOUTH AND THROAT WITH OR WITHOUT PRESENCE OF ULCERS  *URINARY PROBLEMS  *BOWEL PROBLEMS  UNUSUAL RASH Items with * indicate a potential emergency and should be followed up as soon as possible. If you have an emergency after office hours please contact your primary care physician or go to the nearest emergency department.  Please call the clinic during office hours if you have any questions or concerns.   You may also contact the Patient Navigator at [phone] should you have any questions or need assistance in obtaining follow up care.      Resources For Cancer Patients and their Caregivers ? American Cancer Society: Can assist with transportation, wigs, general needs, runs Look Good Feel Better.        1-888-227-6333 ? Cancer Care: Provides financial assistance, online support groups, medication/co-pay assistance.  1-800-813-HOPE  (4673) ? [facility] Assists [place] cancer patients and their families through emotional , educational and financial support.  336-427-4357 ? [place] DSS Where to apply for food stamps, Medicaid and utility assistance. 336-342-1394 ? RCATS: Transportation to medical appointments. 336-347-2287 ? Social Security Administration: May apply for disability if have a Stage IV cancer. 336-342-7796 1-800-772-1213 ? [place] Aging, Disability and Transit Services: Assists with nutrition, care and transit needs. 336-349-2343         

## 2017-11-15 NOTE — Progress Notes (Signed)
[id number] Labs reviewed with and pt seen by Dr. Raliegh Ip and pt approved for Cisplatin infusion today with dose deceased from 40 mg/m2 to 32 mg/m2. Pharmacist notified of this change                                                     Timothy Parks tolerated Cisplatin infusion with hydration well without complaints or incident. VSS upon discharge. Pt discharged self ambulatory in satisfactory condition

## 2017-11-17 ENCOUNTER — Encounter: Payer: Self-pay | Admitting: General Practice

## 2017-11-17 NOTE — Progress Notes (Signed)
[place] CSW Progress Notes  Call from Rhae Lerner [phone]), [facility].  She is a paramedic, social work also visited patient at his home.  States that patient's home is clean, some mold and some water damage from roof damage.  No medical needs identified by paramedic. Medications were verified and appropriate, had some elevated BP.   Patient showed social worker applications that he has completed for subsidized housing.  Program will check on him periodically to assess needs and respond as appropriate.  Edwyna Shell, LCSW Clinical Social Worker Phone:  [phone]

## 2017-11-22 ENCOUNTER — Inpatient Hospital Stay (HOSPITAL_COMMUNITY): Payer: Medicaid Other

## 2017-11-22 ENCOUNTER — Encounter (HOSPITAL_COMMUNITY): Payer: Self-pay | Admitting: Internal Medicine

## 2017-11-22 ENCOUNTER — Inpatient Hospital Stay (HOSPITAL_COMMUNITY): Payer: Medicaid Other | Admitting: Dietician

## 2017-11-22 ENCOUNTER — Other Ambulatory Visit: Payer: Self-pay

## 2017-11-22 ENCOUNTER — Inpatient Hospital Stay (HOSPITAL_COMMUNITY): Payer: Medicaid Other | Attending: Hematology

## 2017-11-22 ENCOUNTER — Inpatient Hospital Stay (HOSPITAL_BASED_OUTPATIENT_CLINIC_OR_DEPARTMENT_OTHER): Payer: Medicaid Other | Admitting: Internal Medicine

## 2017-11-22 VITALS — BP 106/56 | HR 79 | Temp 98.6°F | Resp 16 | Wt 197.6 lb

## 2017-11-22 DIAGNOSIS — G629 Polyneuropathy, unspecified: Secondary | ICD-10-CM | POA: Diagnosis not present

## 2017-11-22 DIAGNOSIS — C329 Malignant neoplasm of larynx, unspecified: Secondary | ICD-10-CM

## 2017-11-22 DIAGNOSIS — R05 Cough: Secondary | ICD-10-CM

## 2017-11-22 DIAGNOSIS — D649 Anemia, unspecified: Secondary | ICD-10-CM | POA: Diagnosis not present

## 2017-11-22 DIAGNOSIS — Z720 Tobacco use: Secondary | ICD-10-CM

## 2017-11-22 DIAGNOSIS — Z5111 Encounter for antineoplastic chemotherapy: Secondary | ICD-10-CM | POA: Insufficient documentation

## 2017-11-22 LAB — CBC WITH DIFFERENTIAL/PLATELET
Basophils Absolute: 0 10*3/uL (ref 0.0–0.1)
Basophils Relative: 0 %
Eosinophils Absolute: 0 10*3/uL (ref 0.0–0.7)
Eosinophils Relative: 1 %
HEMATOCRIT: 29.5 % — AB (ref 39.0–52.0)
HEMOGLOBIN: 9.7 g/dL — AB (ref 13.0–17.0)
Lymphocytes Relative: 26 %
Lymphs Abs: 1 10*3/uL (ref 0.7–4.0)
MCH: 29.6 pg (ref 26.0–34.0)
MCHC: 32.9 g/dL (ref 30.0–36.0)
MCV: 89.9 fL (ref 78.0–100.0)
Monocytes Absolute: 0.8 10*3/uL (ref 0.1–1.0)
Monocytes Relative: 19 %
NEUTROS ABS: 2.2 10*3/uL (ref 1.7–7.7)
Neutrophils Relative %: 54 %
Platelets: 179 10*3/uL (ref 150–400)
RBC: 3.28 MIL/uL — AB (ref 4.22–5.81)
RDW: 12.4 % (ref 11.5–15.5)
WBC: 4.1 10*3/uL (ref 4.0–10.5)

## 2017-11-22 LAB — COMPREHENSIVE METABOLIC PANEL
ALK PHOS: 66 U/L (ref 38–126)
ALT: 21 U/L (ref 17–63)
AST: 39 U/L (ref 15–41)
Albumin: 3.6 g/dL (ref 3.5–5.0)
Anion gap: 7 (ref 5–15)
BILIRUBIN TOTAL: 0.7 mg/dL (ref 0.3–1.2)
BUN: 13 mg/dL (ref 6–20)
CALCIUM: 8.9 mg/dL (ref 8.9–10.3)
CO2: 30 mmol/L (ref 22–32)
CREATININE: 0.95 mg/dL (ref 0.61–1.24)
Chloride: 94 mmol/L — ABNORMAL LOW (ref 101–111)
GFR calc Af Amer: >60 mL/min (ref >60)
Glucose, Bld: 106 mg/dL — ABNORMAL HIGH (ref 65–99)
Potassium: 4.2 mmol/L (ref 3.5–5.1)
Sodium: 131 mmol/L — ABNORMAL LOW (ref 135–145)
Total Protein: 6.8 g/dL (ref 6.5–8.1)

## 2017-11-22 LAB — MAGNESIUM: Magnesium: 1.7 mg/dL (ref 1.7–2.4)

## 2017-11-22 MED ORDER — SODIUM CHLORIDE 0.9 % IV SOLN
INTRAVENOUS | Status: DC
  Administered 2017-11-22: 11:00:00 via INTRAVENOUS

## 2017-11-22 MED ORDER — PALONOSETRON HCL INJECTION 0.25 MG/5ML
0.2500 mg | Freq: Once | INTRAVENOUS | Status: AC
  Administered 2017-11-22: 0.25 mg via INTRAVENOUS

## 2017-11-22 MED ORDER — HEPARIN SOD (PORK) LOCK FLUSH 100 UNIT/ML IV SOLN
500.0000 [IU] | Freq: Once | INTRAVENOUS | Status: AC | PRN
  Administered 2017-11-22: 500 [IU]

## 2017-11-22 MED ORDER — POTASSIUM CHLORIDE 2 MEQ/ML IV SOLN
Freq: Once | INTRAVENOUS | Status: AC
  Administered 2017-11-22: 10:00:00 via INTRAVENOUS
  Filled 2017-11-22: qty 10

## 2017-11-22 MED ORDER — SODIUM CHLORIDE 0.9 % IV SOLN
Freq: Once | INTRAVENOUS | Status: AC
  Administered 2017-11-22: 11:00:00 via INTRAVENOUS
  Filled 2017-11-22: qty 5

## 2017-11-22 MED ORDER — SODIUM CHLORIDE 0.9 % IV SOLN
32.0000 mg/m2 | Freq: Once | INTRAVENOUS | Status: AC
  Administered 2017-11-22: 68 mg via INTRAVENOUS
  Filled 2017-11-22: qty 68

## 2017-11-22 MED ORDER — PALONOSETRON HCL INJECTION 0.25 MG/5ML
INTRAVENOUS | Status: AC
  Filled 2017-11-22: qty 5

## 2017-11-22 NOTE — Progress Notes (Signed)
Nutrition Assessment  Reason for Assessment: New Head/neck cancer patient  ASSESSMENT:  57 y/o male PMHx tobacco/etoh abuse, HTN, Anxiety. Underwent biopsy of a neck mass on 3/5, which he had reported having x4 months. Had intermittent throat pain, no dysphagia or weight loss. Biopsy showed SCC. Began concomitant chemoradiation 4/9. Dose 2- 4/16, Dose 3 4/23, Dose 4. 4/30 (decreased strength)  Patient reportedly tolerate week 1 of tx exceptionally well, no n/v/c/d or dysphagia. At office visit preceding 4th chemo infusion, patient noted pain in his throat, nausea, xerostomia, peripheral neuropathy, mild dysphagia. Lost 4-5 lbs  Today, patient is seen during infusion. He is a poor historian and has exceptionally poor health literacy. Obtaining history is drastically limited as patient cuts off/talks over RDs questions/explanations and begins to discuss completely unrelated subjects/topics.   He says he has been eating a lot of icecream. He sounds to have an element of dysphagia. He does not know if he can eat meats because he hasnt been buying them. He notes his mouth bleeds when he eats potato chips.   He says he has "no taste" and all food is flavorless. He is forcing himself to eat. He doesn't want to add salt for flavor because he is worried about his HTN, though he loves drinking V8.  He has been drinking Ensure/Boost.   He initially says he does not have a dry mouth, but later contradicts this and says he is struggling with dryness and has been using biotene.   He has thick mucous that he is having difficulty removing. He is not chewing on ice chips because he doesn't have an icemaker.   RD asks about if he has been doing his ST exercises. He begins by talking about how he has been exercising because he is moving where he lives and this is strenuous work.   RD asks about his weight prior to presentation. He says "at one point I was 230 lbs". Appears in chart that he was closer to 210 at  presentation. He denied having weight loss prior to tx. He says he is happy he is losing weight and wants to keep losing, "until I get to my ideal weight of 160 lbs"    Wt Readings from Last 10 Encounters:  11/22/17 197 lb 9.6 oz (89.6 kg)  11/15/17 199 lb 12.8 oz (90.6 kg)  11/08/17 206 lb 6.4 oz (93.6 kg)  11/01/17 207 lb 12.8 oz (94.3 kg)  10/25/17 206 lb 11.2 oz (93.8 kg)  10/21/17 204 lb (92.5 kg)  10/17/17 204 lb (92.5 kg)  10/13/17 206 lb (93.4 kg)  10/10/17 207 lb (93.9 kg)  09/29/17 204 lb 12.8 oz (92.9 kg)   NUTRITION - FOCUSED PHYSICAL EXAM: Obese, no fat/muscle wasting  MEDICATIONS: Compazine, Colace, Zorfan, oxycodone  LABS:  Recent Labs  Lab 11/22/17 0901  NA 131*  K 4.2  CL 94*  CO2 30  BUN 13  CREATININE 0.95  CALCIUM 8.9  MG 1.7  GLUCOSE 106*   ANTHROPOMETRICS: Height: 5\' 7"  (170.18 cm) Weight: 197 lbs 10 oz (89.6 lbs)  BMI: 30.9-obese  ESTIMATED ENERGY NEEDS:  Kcal: 2350-2550 (35-38 kcal/kg ibw) Protein: 100- 115g Pro (1.5-1.7 g/kg ibw) Fluid: 2.4-2.6 L fluid (1 ml/kcal)   NUTRITION DIAGNOSIS:    related to   as evidenced by  .  DOCUMENTATION CODES:  N/A  INTERVENTION:  He wants to lose to 160 lbs so he can fit into his pants. RD attempted 2x to discuss why he should not desire  to lose weight and how this shows that "the cancer is winning", both of which attempts were interrupted. RD wrote on top of handouts "do not try to lose weight"   RD reviewed list of soft/moist high protein foods and associated handout titled "Soft and Moist High-Protein Menu Ideas". He notes he can eat peanut butter well.   RD reviewed handout titled "Dry mouth or Thick saliva". RD recommended either pineapple juice or papaya nectar. He confuses papaya nectar for honey.   RD recommended chewing on ice chips throughout the day to deal with dryness. He digresses to talk about how he is going to install an ice maker that "just makes ice".   RD reviewed Ensure  assistance program. He accepts his first case of Ensure Today. He is eligible for two more.    RD was present when Patient was given bagged lunch from dietary. This was a sandwich. He immediately began to cough when chewing on this sandwich. RD tells him to slow down and try to sip water to add moisture. He says he is coughing because of the mucus, not the sandwich. RD feels he is at a high risk of aspiration as he likely is not aware he is aspirating. However, unlikely he would be compliant, or understand, a texture restricted diet.   Discussed patient case with SLP.   Was unable to make much headway with patient today for previously stated reasons. Will follow up and re attempt education in a few weeks.   GOAL:  Oral intake to meet >90% needs, wt stability, Patient will show understanding of need to maintain weight  MONITOR:  Weight, diet tolerance, s/s aspiration, chemo tolerance  Next Visit:  3 weeks.   Burtis Junes RD, LDN, CNSC Clinical Nutrition Available Tues-Sat via Pager: [phone] 11/22/2017 10:36 AM

## 2017-11-22 NOTE — Progress Notes (Signed)
Diagnosis No diagnosis found.  Staging Cancer Staging Laryngeal cancer [facility]) Staging form: Larynx - Glottis, AJCC 8th Edition - Clinical stage from 09/29/2017: Stage IVA (cT1a, cN2a, cM0) - Signed by Derek Jack, MD on 09/29/2017   Assessment and Plan:  1.  Laryngeal cancer (Timothy Parks).  Stage IV a (cT1a, cN2b,M0) laryngeal cancer.  Pt is on Weekly cisplatin with radiation started on 10/25/2017.  Labs adequate for chemotherapy.  Cr. Is 0.95.  WBC count is 4.1.  He will proceed with week 5 of Cisplatin.  He denied any nausea or vomiting.  Continue to drink fluids.  He will follow-up with Dr. Delton Coombes in 1 week.    2.  Neuropathy.  He is on Neurontin 3 times a day Rxd by Dr. Delton Coombes.  Will continue to monitor as therapy proceeds.    3.  Anemia.  HB 9.7.  Will repeat labs in 1 week.    4.  Cough.  Pt continues to smoke. Pulse ox 100% on room air.  Mucinex prn.    Interval History:  57 yr old male followed by Dr. Delton Coombes for Stage IV a (cT1a, cN2b, M0) laryngeal cancer:  PET/CT scan done 10/04/2017 did not show any evidence of distant metastatic disease.  Left-sided level 2 and level 3 lymph nodes were seen.  He is on weekly cisplatin (40 mg/m) for 6-[redacted] weeks along with radiation therapy.   Current Status:  Pt is seen today for follow-up.  He is complaining of coughing and neuropathy.  He reports RT will finish May 30th.     Laryngeal cancer ([place])   09/29/2017 Initial Diagnosis    Laryngeal cancer (Timothy Parks)      10/23/2017 -  Chemotherapy    The patient had palonosetron (ALOXI) injection 0.25 mg, 0.25 mg, Intravenous,  Once, 5 of 7 cycles Administration: 0.25 mg (10/25/2017), 0.25 mg (11/01/2017), 0.25 mg (11/08/2017), 0.25 mg (11/15/2017) CISplatin (PLATINOL) 85 mg in sodium chloride 0.9 % 250 mL chemo infusion, 40 mg/m2 = 85 mg, Intravenous,  Once, 5 of 7 cycles Dose modification: 32 mg/m2 (80 % of original dose 40 mg/m2, Cycle 4, Reason: Other (see comments), Comment:  neuropathy) Administration: 85 mg (10/25/2017), 85 mg (11/01/2017), 85 mg (11/08/2017), 68 mg (11/15/2017) fosaprepitant (EMEND) 150 mg, dexamethasone (DECADRON) 12 mg in sodium chloride 0.9 % 145 mL IVPB, , Intravenous,  Once, 5 of 7 cycles Administration:  (10/25/2017),  (11/01/2017),  (11/08/2017),  (11/15/2017)  for chemotherapy treatment.         Problem List Patient Active Problem List   Diagnosis Date Noted  . Laryngeal cancer (Timothy Parks) [C32.9] 09/29/2017  . Goals of care, counseling/discussion [Z71.89] 09/29/2017    Past Medical History Past Medical History:  Diagnosis Date  . Anxiety   . Cancer (Timothy Parks)    laryngeal  . COPD (chronic obstructive pulmonary disease) (Timothy Parks)    pt states he was told he has COPD by health department  . Hypertension   . 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 BLADDER SURGERY    . MICROLARYNGOSCOPY N/A 09/20/2017   Procedure: MICRO LARYNGOSCOPY WITH BIOPSY OF LARYNGEAL MASS;  Surgeon: Leta Baptist, MD;  Location: [facility];  Service: ENT;  Laterality: N/A;  . PORTACATH PLACEMENT Left 10/21/2017   Procedure: INSERTION PORT-A-CATH;  Surgeon: Virl Cagey, MD;  Location: [facility];  Service: General;  Laterality: Left;    Family History Family History  Problem Relation Age of  Onset  . Bladder Cancer Mother        73  . Colon cancer Father   . Thyroid disease Sister   . Heart disease Brother        stent placement  . Heart disease Maternal Grandmother   . Diabetes Maternal Grandfather   . Cancer Paternal Grandmother   . AAA (abdominal aortic aneurysm) Paternal Grandfather      Social History  reports that he has been smoking cigarettes.  He has been smoking about 0.00 packs per day. His smokeless tobacco use includes snuff. He reports that he does not drink alcohol or use drugs.  Medications  Current Outpatient Medications:  .  alum & mag hydroxide-simeth  (MAALOX/MYLANTA) 200-200-20 MG/5ML suspension, Take 30 mLs by mouth as needed for indigestion or heartburn., Disp: , Rfl:  .  amLODipine (NORVASC) 10 MG tablet, Take 10 mg by mouth at bedtime. , Disp: , Rfl:  .  docusate sodium (COLACE) 100 MG capsule, Take 1 capsule (100 mg total) by mouth 2 (two) times daily as needed for mild constipation., Disp: 60 capsule, Rfl: 0 .  doxylamine, Sleep, (UNISOM) 25 MG tablet, Take 25 mg by mouth at bedtime as needed for sleep., Disp: , Rfl:  .  FLUoxetine (PROZAC) 20 MG tablet, Take 60 mg by mouth daily. , Disp: , Rfl:  .  gabapentin (NEURONTIN) 300 MG capsule, Take 1 capsule (300 mg total) by mouth 3 (three) times daily., Disp: 90 capsule, Rfl: 1 .  lidocaine-prilocaine (EMLA) cream, Apply to affected area once, Disp: 30 g, Rfl: 3 .  loratadine (CLARITIN) 10 MG tablet, Take 10 mg by mouth daily as needed for allergies., Disp: , Rfl:  .  lovastatin (MEVACOR) 20 MG tablet, Take 20 mg by mouth at bedtime., Disp: , Rfl:  .  ondansetron (ZOFRAN) 8 MG tablet, Take 1 tablet (8 mg total) by mouth 2 (two) times daily as needed. Start on the third day after chemotherapy., Disp: 30 tablet, Rfl: 1 .  oxyCODONE (ROXICODONE) 5 MG immediate release tablet, Take 1 tablet (5 mg total) by mouth every 4 (four) hours as needed for severe pain or breakthrough pain., Disp: 5 tablet, Rfl: 0 .  prochlorperazine (COMPAZINE) 10 MG tablet, Take 1 tablet (10 mg total) by mouth every 6 (six) hours as needed (Nausea or vomiting)., Disp: 30 tablet, Rfl: 1 .  sodium fluoride (PREVIDENT 5000 PLUS) 1.1 % CREA dental cream, Apply cream to tooth brush. Brush teeth for 2 minutes. Spit out excess. Repeat nightly., Disp: 1 Tube, Rfl: prn  Allergies Tetracyclines & related  Review of Systems Review of Systems - Oncology ROS as per HPI otherwise 12 point ROS is negative.   Physical Exam  Vitals Wt Readings from Last 3 Encounters:  11/22/17 197 lb 9.6 oz (89.6 kg)  11/15/17 199 lb 12.8 oz  (90.6 kg)  11/08/17 206 lb 6.4 oz (93.6 kg)   Temp Readings from Last 3 Encounters:  11/22/17 98.3 F (36.8 C) (Oral)  11/15/17 98 F (36.7 C) (Oral)  11/08/17 98.1 F (36.7 C) (Oral)   BP Readings from Last 3 Encounters:  11/22/17 124/85  11/15/17 98/70  11/08/17 135/76   Pulse Readings from Last 3 Encounters:  11/22/17 70  11/15/17 88  11/08/17 77    Constitutional: Well-developed, well-nourished, and in no distress.   HENT: Head: Normocephalic and atraumatic.  Mouth/Throat: No oropharyngeal exudate. Mucosa moist. Hoarse voice.  No thrush.   Eyes: Pupils are equal, round, and reactive  to light. Conjunctivae are normal. No scleral icterus.  Neck: Normal range of motion. Neck supple. No JVD present.  Cardiovascular: Normal rate, regular rhythm and normal heart sounds.  Exam reveals no gallop and no friction rub.   No murmur heard. Pulmonary/Chest: Effort normal and breath sounds normal. No respiratory distress. No wheezes.No rales.  Abdominal: Soft. Bowel sounds are normal. No distension. There is no tenderness. There is no guarding.  Musculoskeletal: No edema or tenderness.  Lymphadenopathy: No cervical, axillary or supraclavicular adenopathy.  Neurological: Alert and oriented to person, place, and time. No cranial nerve deficit.  Skin: Skin is warm and dry. No rash noted. No erythema. No pallor.  Psychiatric: Affect and judgment normal.   Labs Appointment on 11/22/2017  Component Date Value Ref Range Status  . WBC 11/22/2017 4.1  4.0 - 10.5 K/uL Final  . RBC 11/22/2017 3.28* 4.22 - 5.81 MIL/uL Final  . Hemoglobin 11/22/2017 9.7* 13.0 - 17.0 g/dL Final  . HCT 11/22/2017 29.5* 39.0 - 52.0 % Final  . MCV 11/22/2017 89.9  78.0 - 100.0 fL Final  . MCH 11/22/2017 29.6  26.0 - 34.0 pg Final  . MCHC 11/22/2017 32.9  30.0 - 36.0 g/dL Final  . RDW 11/22/2017 12.4  11.5 - 15.5 % Final  . Platelets 11/22/2017 179  150 - 400 K/uL Final  . Neutrophils Relative % 11/22/2017 54  %  Final  . Neutro Abs 11/22/2017 2.2  1.7 - 7.7 K/uL Final  . Lymphocytes Relative 11/22/2017 26  % Final  . Lymphs Abs 11/22/2017 1.0  0.7 - 4.0 K/uL Final  . Monocytes Relative 11/22/2017 19  % Final  . Monocytes Absolute 11/22/2017 0.8  0.1 - 1.0 K/uL Final  . Eosinophils Relative 11/22/2017 1  % Final  . Eosinophils Absolute 11/22/2017 0.0  0.0 - 0.7 K/uL Final  . Basophils Relative 11/22/2017 0  % Final  . Basophils Absolute 11/22/2017 0.0  0.0 - 0.1 K/uL Final   Performed at [facility], [address], [place], [place] [address]  . Sodium 11/22/2017 131* 135 - 145 mmol/L Final  . Potassium 11/22/2017 4.2  3.5 - 5.1 mmol/L Final  . Chloride 11/22/2017 94* 101 - 111 mmol/L Final  . CO2 11/22/2017 30  22 - 32 mmol/L Final  . Glucose, Bld 11/22/2017 106* 65 - 99 mg/dL Final  . BUN 11/22/2017 13  6 - 20 mg/dL Final  . Creatinine, Ser 11/22/2017 0.95  0.61 - 1.24 mg/dL Final  . Calcium 11/22/2017 8.9  8.9 - 10.3 mg/dL Final  . Total Protein 11/22/2017 6.8  6.5 - 8.1 g/dL Final  . Albumin 11/22/2017 3.6  3.5 - 5.0 g/dL Final  . AST 11/22/2017 39  15 - 41 U/L Final  . ALT 11/22/2017 21  17 - 63 U/L Final  . Alkaline Phosphatase 11/22/2017 66  38 - 126 U/L Final  . Total Bilirubin 11/22/2017 0.7  0.3 - 1.2 mg/dL Final  . GFR calc non Af Amer 11/22/2017 >60  >60 mL/min Final  . GFR calc Af Amer 11/22/2017 >60  >60 mL/min Final   Comment: (NOTE) The eGFR has been calculated using the CKD EPI equation. This calculation has not been validated in all clinical situations. eGFR's persistently <60 mL/min signify possible Chronic Kidney Disease.   Georgiann Hahn gap 11/22/2017 7  5 - 15 Final   Performed at [facility], [address], [place], [place] [address]  . Magnesium 11/22/2017 1.7  1.7 - 2.4 mg/dL Final  Performed at [facility], [address], [place], [place] [address]     Pathology No orders of the defined types were placed in this encounter.      Zoila Shutter MD

## 2017-11-23 NOTE — Patient Instructions (Signed)
[facility] Discharge Instructions for Patients Receiving Chemotherapy   Beginning January 23rd 2017 lab work for the Cancer Center will be done in the  Main lab at [place] on 1st floor. If you have a lab appointment with the Cancer Center please come in thru the  Main Entrance and check in at the main information desk   Today you received the following chemotherapy agents   To help prevent nausea and vomiting after your treatment, we encourage you to take your nausea medication     If you develop nausea and vomiting, or diarrhea that is not controlled by your medication, call the clinic.  The clinic phone number is [phone]. Office hours are Monday-Friday 8:30am-5:00pm.  BELOW ARE SYMPTOMS THAT SHOULD BE REPORTED IMMEDIATELY:  *FEVER GREATER THAN 101.0 F  *CHILLS WITH OR WITHOUT FEVER  NAUSEA AND VOMITING THAT IS NOT CONTROLLED WITH YOUR NAUSEA MEDICATION  *UNUSUAL SHORTNESS OF BREATH  *UNUSUAL BRUISING OR BLEEDING  TENDERNESS IN MOUTH AND THROAT WITH OR WITHOUT PRESENCE OF ULCERS  *URINARY PROBLEMS  *BOWEL PROBLEMS  UNUSUAL RASH Items with * indicate a potential emergency and should be followed up as soon as possible. If you have an emergency after office hours please contact your primary care physician or go to the nearest emergency department.  Please call the clinic during office hours if you have any questions or concerns.   You may also contact the Patient Navigator at [phone] should you have any questions or need assistance in obtaining follow up care.      Resources For Cancer Patients and their Caregivers ? American Cancer Society: Can assist with transportation, wigs, general needs, runs Look Good Feel Better.        1-888-227-6333 ? Cancer Care: Provides financial assistance, online support groups, medication/co-pay assistance.  1-800-813-HOPE (4673) ? [facility] Assists [place] cancer  patients and their families through emotional , educational and financial support.  336-427-4357 ? [place] DSS Where to apply for food stamps, Medicaid and utility assistance. 336-342-1394 ? RCATS: Transportation to medical appointments. 336-347-2287 ? Social Security Administration: May apply for disability if have a Stage IV cancer. 336-342-7796 1-800-772-1213 ? [place] Aging, Disability and Transit Services: Assists with nutrition, care and transit needs. 336-349-2343         

## 2017-11-29 ENCOUNTER — Encounter (HOSPITAL_COMMUNITY): Payer: Self-pay

## 2017-11-29 ENCOUNTER — Inpatient Hospital Stay (HOSPITAL_COMMUNITY): Payer: Medicaid Other

## 2017-11-29 ENCOUNTER — Other Ambulatory Visit: Payer: Self-pay

## 2017-11-29 ENCOUNTER — Encounter (HOSPITAL_COMMUNITY): Payer: Self-pay | Admitting: General Practice

## 2017-11-29 ENCOUNTER — Inpatient Hospital Stay (HOSPITAL_COMMUNITY): Payer: Medicaid Other | Admitting: Dietician

## 2017-11-29 ENCOUNTER — Inpatient Hospital Stay (HOSPITAL_BASED_OUTPATIENT_CLINIC_OR_DEPARTMENT_OTHER): Payer: Medicaid Other | Admitting: Adult Health

## 2017-11-29 ENCOUNTER — Encounter (HOSPITAL_COMMUNITY): Payer: Self-pay | Admitting: Adult Health

## 2017-11-29 VITALS — BP 111/72 | HR 73 | Temp 98.2°F | Resp 18 | Wt 194.0 lb

## 2017-11-29 DIAGNOSIS — C329 Malignant neoplasm of larynx, unspecified: Secondary | ICD-10-CM

## 2017-11-29 DIAGNOSIS — Z5111 Encounter for antineoplastic chemotherapy: Secondary | ICD-10-CM

## 2017-11-29 DIAGNOSIS — D649 Anemia, unspecified: Secondary | ICD-10-CM

## 2017-11-29 DIAGNOSIS — I10 Essential (primary) hypertension: Secondary | ICD-10-CM | POA: Diagnosis not present

## 2017-11-29 DIAGNOSIS — G629 Polyneuropathy, unspecified: Secondary | ICD-10-CM

## 2017-11-29 LAB — CBC WITH DIFFERENTIAL/PLATELET
Basophils Absolute: 0 10*3/uL (ref 0.0–0.1)
Basophils Relative: 0 %
EOS ABS: 0 10*3/uL (ref 0.0–0.7)
Eosinophils Relative: 0 %
HEMATOCRIT: 28 % — AB (ref 39.0–52.0)
Hemoglobin: 9.2 g/dL — ABNORMAL LOW (ref 13.0–17.0)
LYMPHS ABS: 0.9 10*3/uL (ref 0.7–4.0)
LYMPHS PCT: 28 %
MCH: 29.7 pg (ref 26.0–34.0)
MCHC: 32.9 g/dL (ref 30.0–36.0)
MCV: 90.3 fL (ref 78.0–100.0)
Monocytes Absolute: 0.5 10*3/uL (ref 0.1–1.0)
Monocytes Relative: 16 %
NEUTROS PCT: 56 %
Neutro Abs: 1.8 10*3/uL (ref 1.7–7.7)
PLATELETS: 188 10*3/uL (ref 150–400)
RBC: 3.1 MIL/uL — AB (ref 4.22–5.81)
RDW: 13 % (ref 11.5–15.5)
WBC: 3.2 10*3/uL — AB (ref 4.0–10.5)

## 2017-11-29 LAB — COMPREHENSIVE METABOLIC PANEL
ALK PHOS: 69 U/L (ref 38–126)
ALT: 19 U/L (ref 17–63)
AST: 26 U/L (ref 15–41)
Albumin: 3.7 g/dL (ref 3.5–5.0)
Anion gap: 9 (ref 5–15)
BILIRUBIN TOTAL: 0.3 mg/dL (ref 0.3–1.2)
BUN: 22 mg/dL — AB (ref 6–20)
CALCIUM: 9 mg/dL (ref 8.9–10.3)
CHLORIDE: 90 mmol/L — AB (ref 101–111)
CO2: 31 mmol/L (ref 22–32)
CREATININE: 1.33 mg/dL — AB (ref 0.61–1.24)
GFR, EST NON AFRICAN AMERICAN: 58 mL/min — AB (ref >60)
Glucose, Bld: 95 mg/dL (ref 65–99)
Potassium: 4.1 mmol/L (ref 3.5–5.1)
Sodium: 130 mmol/L — ABNORMAL LOW (ref 135–145)
TOTAL PROTEIN: 7 g/dL (ref 6.5–8.1)

## 2017-11-29 MED ORDER — PALONOSETRON HCL INJECTION 0.25 MG/5ML
INTRAVENOUS | Status: AC
  Filled 2017-11-29: qty 5

## 2017-11-29 MED ORDER — POTASSIUM CHLORIDE 2 MEQ/ML IV SOLN
Freq: Once | INTRAVENOUS | Status: AC
  Administered 2017-11-29: 10:00:00 via INTRAVENOUS
  Filled 2017-11-29: qty 10

## 2017-11-29 MED ORDER — HEPARIN SOD (PORK) LOCK FLUSH 100 UNIT/ML IV SOLN
500.0000 [IU] | Freq: Once | INTRAVENOUS | Status: AC | PRN
  Administered 2017-11-29: 500 [IU]

## 2017-11-29 MED ORDER — SODIUM CHLORIDE 0.9 % IV SOLN
Freq: Once | INTRAVENOUS | Status: AC
  Administered 2017-11-29: 12:00:00 via INTRAVENOUS
  Filled 2017-11-29: qty 5

## 2017-11-29 MED ORDER — PALONOSETRON HCL INJECTION 0.25 MG/5ML
0.2500 mg | Freq: Once | INTRAVENOUS | Status: AC
  Administered 2017-11-29: 0.25 mg via INTRAVENOUS

## 2017-11-29 MED ORDER — GABAPENTIN 300 MG PO CAPS: 300.0000 mg | ORAL_CAPSULE | Freq: Three times a day (TID) | ORAL | 1 refills | Status: AC

## 2017-11-29 MED ORDER — SODIUM CHLORIDE 0.9 % IV SOLN
32.0000 mg/m2 | Freq: Once | INTRAVENOUS | Status: AC
  Administered 2017-11-29: 68 mg via INTRAVENOUS
  Filled 2017-11-29: qty 68

## 2017-11-29 MED ORDER — SODIUM CHLORIDE 0.9 % IV SOLN
INTRAVENOUS | Status: DC
  Administered 2017-11-29: 14:00:00 via INTRAVENOUS

## 2017-11-29 NOTE — Progress Notes (Signed)
Tolerated tx w/o adverse reaction.  Alert, in no distress.  VSS.  Discharged ambulatory in c/o family.  

## 2017-11-29 NOTE — Progress Notes (Signed)
[facility] CSW Progress Note  Brief visit w patient while in infusion.  Is in process of moving from trailer into new disability housing.  Needs help from family to transport possessions.  Has run out of medications as he has difficulty getting to [facility] to pick up refills.  Cannot always depend on family to assist.  Has noted increased irritability, notes his fluoxetine has "run out."  Encouraged to get today when his mother is taking him home via pharmacy.  Will need to track whether access to medications is an ongoing issue for patient and may require additional support.    Edwyna Shell, LCSW Clinical Social Worker Phone:  [phone]

## 2017-11-29 NOTE — Progress Notes (Signed)
Nutrition Assessment  Reason for Assessment: New Head/neck cancer patient  ASSESSMENT:  57 y/o male PMHx tobacco/etoh abuse, HTN, Anxiety. Underwent biopsy of a neck mass on 3/5, which he had reported having x4 months. Had intermittent throat pain, no dysphagia or weight loss. Biopsy showed SCC. Began concomitant chemoradiation 4/9. Dose 2- 4/16, Dose 3 4/23, Dose 4. 4/30 (decreased strength), Dose 5 (5/07)  Patient seen in f/u today to re-attempt diet education as he was extremely tangential/hard to redirect last week.   Pt today says that relatively nothing has changed over the past week. He still has ageusia, intermittent throat sores and xerostomia. However, his main complaint is some pain/neuropathy to RLE which he says stops him from sleeping.   He says he is drinking 3-4 Ensure supplements/day, but sometimes does have nausea from them. Food wise, he continues to eat soft items such as pudding applesauce. He has been seeking out items that will offer some taste and notes "potted meats" and "beanie weanies" have provided that for him.   He is seen sucking on mints to help with his mouth dryness. He is also still using the biotene.    Wt wise, he has dropped 3.6 lbs in the last week. Again, he notes he is happy he is losing weight, however he notes Rad Onc says his weight is "perfect" where it is.    Wt Readings from Last 10 Encounters:  11/29/17 194 lb (88 kg)  11/22/17 197 lb 9.6 oz (89.6 kg)  11/15/17 199 lb 12.8 oz (90.6 kg)  11/08/17 206 lb 6.4 oz (93.6 kg)  11/01/17 207 lb 12.8 oz (94.3 kg)  10/25/17 206 lb 11.2 oz (93.8 kg)  10/21/17 204 lb (92.5 kg)  10/17/17 204 lb (92.5 kg)  10/13/17 206 lb (93.4 kg)  10/10/17 207 lb (93.9 kg)   NUTRITION - FOCUSED PHYSICAL EXAM: Obese, perhaps mild orbital fat loss  MEDICATIONS:  Chemo: Cisplatin weekly x 6-7 weeks Supportive meds: Compazine, Colace, Zorfan, oxycodone Other: Diflucan  Labs: BUN/Creat increased to 22/1.33 from  13/.95, Na: 130  LABS:  Recent Labs  Lab 11/29/17 0916  NA 130*  K 4.1  CL 90*  CO2 31  BUN 22*  CREATININE 1.33*  CALCIUM 9.0  GLUCOSE 95   ANTHROPOMETRICS: Height: 5\' 7"  (170.18 cm) Weight: 194 (88 kg)  BMI: 30.4-obese  ESTIMATED ENERGY NEEDS:  Kcal: 2350-2550 (35-38 kcal/kg ibw) Protein: 100- 115g Pro (1.5-1.7 g/kg ibw) Fluid: 2.4-2.6 L fluid (1 ml/kcal)   NUTRITION DIAGNOSIS:  Increased Protein/kcal needs related to cancer and cancer related tx AEB estimated nutritional requirements for thse conditions  DOCUMENTATION CODES:  N/A - does meet wt loss criteria x1 month (>5%)  INTERVENTION:  Patient was more attentive to what RD had to say today.   While he wants to continue to lose weight, RD again asked patient to try to maintain his weight as a loss is a sign he is not receiving adequate nutrition.   He has found that he is able to taste saltier items, such as canned/processed meats. While laden with sodium, these could provide patient with kcals/pro he needs and he is recommended to continue to eat these.   Symptom wise, he says his mouth sores resolve within "30 min" of using his prescribed mouth wash. He still has a dry mouth, though sucking on the candies has helped. Nausea is well managed with zofran.   His only concern is leg pain.   He brings up today that Dr. Benjamine Mola  had asked him to do "mouth exercises". Told patient that those were the exercises the MD was referencing and he should do them daily. Took this opportunity to discuss the effects that radiation can have on his swallow function and how it is important he do these to help prevent trismus. He still has his "toungue depressor stack" and knows how to use it. He also remembers the packet of exercises the SLP had given him.   He is still relying on supplements. He is given his second case of Ensure Enlive today. He is eligible for one more.   His final tx is next week. Will f/u with him then.   GOAL:   Oral intake to meet >90% needs, wt stability, Patient will show understanding of need to maintain weight  Progressing  MONITOR:  Weight, diet tolerance, s/s aspiration, chemo tolerance  Next Visit:  1 week   Burtis Junes RD, LDN, CNSC Clinical Nutrition Available Tues-Sat via Pager: [phone] 11/29/2017 2:31 PM

## 2017-11-29 NOTE — Progress Notes (Signed)
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  CURRENT THERAPY: Concurrent chemoXRT with Cisplatin weekly   BRIEF ONCOLOGIC HISTORY:    Laryngeal cancer ([facility])   09/29/2017 Initial Diagnosis    Laryngeal cancer ([place])      10/23/2017 -  Chemotherapy    The patient had palonosetron (ALOXI) injection 0.25 mg, 0.25 mg, Intravenous,  Once, 6 of 7 cycles Administration: 0.25 mg (10/25/2017), 0.25 mg (11/01/2017), 0.25 mg (11/08/2017), 0.25 mg (11/15/2017), 0.25 mg (11/22/2017) CISplatin (PLATINOL) 85 mg in sodium chloride 0.9 % 250 mL chemo infusion, 40 mg/m2 = 85 mg, Intravenous,  Once, 6 of 7 cycles Dose modification: 32 mg/m2 (80 % of original dose 40 mg/m2, Cycle 4, Reason: Other (see comments), Comment: neuropathy) Administration: 85 mg (10/25/2017), 85 mg (11/01/2017), 85 mg (11/08/2017), 68 mg (11/15/2017), 68 mg (11/22/2017) fosaprepitant (EMEND) 150 mg, dexamethasone (DECADRON) 12 mg in sodium chloride 0.9 % 145 mL IVPB, , Intravenous,  Once, 6 of 7 cycles Administration:  (10/25/2017),  (11/01/2017),  (11/08/2017),  (11/15/2017),  (11/22/2017),  (11/29/2017)  for chemotherapy treatment.           INTERVAL HISTORY:  Mr. Hyneman 57 y.o. male returns for routine follow-up and consideration for next cycle of chemotherapy.   Due for cycle #6 of Cisplatin today.   Overall, he tells me he has been feeling pretty well. Energy levels and appetite 75%.  His biggest complaint today is continued (R) LE/ankle/foot pain. This has been present for the past 5-6 weeks, when he had a trauma to his foot "having to drag a dead dog to be buried for my neighbor."  Notes that the bottom of his right foot is very numb, burns, and itches at times.  From the knee down is tender to touch. No skin  changes or warmth to this area.  No gross swelling to right leg/foot.  He is ambulating with a cane d/t the pain.  Reports that he did have several falls about 3-4 weeks ago "when I was trying to push mow my grass."  No peripheral neuropathy to his left foot or his fingertips.   He feels tired at times.  Reports that he "coughs up a little bit of blood first thing in the morning."  He attributes this to his radiation. He tells me there is scant blood and it resolves quickly.  Denies any other frank bleeding episodes that he is aware of. He tells me that he was recently treated for "some stuff in my mouth and throat" by his radiation oncologist; states that he took pills for about 10 days for this (I surmise this was for thrush).  He has occasional "water bumps" to his tongue and buccal mucosal; he uses Biotene for this which is helpful.  Reports having no taste; he tries to supplement his diet with Ensure x 4 cans daily.    His neck is beginning to get red from radiation therapy. States he was given "some cream for it."  He then tells me that he has been out in the sun fishing more lately.  STRONGLY advised him to avoid sun exposure while undergoing radiation therapy and/or wear a wide-brimmed hat, keep radiation areas covered, and wear 50+ SPF while outside.    Overall, he feels ready for next  cycle of chemo today.     REVIEW OF SYSTEMS:  Review of Systems - Oncology Per HPI. Otherwise 14 point ROS reviewed and negative except as stated above.   PAST MEDICAL/SURGICAL HISTORY:  Past Medical History:  Diagnosis Date  . Anxiety   . Cancer ([facility])    laryngeal  . COPD (chronic obstructive pulmonary disease) ([facility])    pt states he was told he has COPD by health department  . Hypertension   . 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 BLADDER SURGERY    . MICROLARYNGOSCOPY N/A 09/20/2017   Procedure: MICRO LARYNGOSCOPY WITH BIOPSY OF LARYNGEAL MASS;   Surgeon: Leta Baptist, MD;  Location: [facility];  Service: ENT;  Laterality: N/A;  . PORTACATH PLACEMENT Left 10/21/2017   Procedure: INSERTION PORT-A-CATH;  Surgeon: Virl Cagey, MD;  Location: [facility];  Service: General;  Laterality: Left;     SOCIAL HISTORY:  Social History   Socioeconomic History  . Marital status: Single    Spouse name: Not on file  . Number of children: Not on file  . Years of education: Not on file  . Highest education level: Not on file  Occupational History  . Not on file  Social Needs  . Financial resource strain: Not on file  . Food insecurity:    Worry: Not on file    Inability: Not on file  . Transportation needs:    Medical: Not on file    Non-medical: Not on file  Tobacco Use  . Smoking status: Current Some Day Smoker    Packs/day: 0.00    Types: Cigarettes  . Smokeless tobacco: Current User    Types: Snuff  . Tobacco comment: 2 cigarettes with coffee in the morning  Substance and Sexual Activity  . Alcohol use: No    Frequency: Never    Comment: in recovery x since 2013  . Drug use: No  . Sexual activity: Not Currently  Lifestyle  . Physical activity:    Days per week: Not on file    Minutes per session: Not on file  . Stress: Not on file  Relationships  . Social connections:    Talks on phone: Not on file    Gets together: Not on file    Attends religious service: Not on file    Active member of club or organization: Not on file    Attends meetings of clubs or organizations: Not on file    Relationship status: Not on file  . Intimate partner violence:    Fear of current or ex partner: Not on file    Emotionally abused: Not on file    Physically abused: Not on file    Forced sexual activity: Not on file  Other Topics Concern  . Not on file  Social History Narrative  . Not on file    FAMILY HISTORY:  Family History  Problem Relation Age of Onset  . Bladder Cancer Mother        93  . Colon cancer  Father   . Thyroid disease Sister   . Heart disease Brother        stent placement  . Heart disease Maternal Grandmother   . Diabetes Maternal Grandfather   . Cancer Paternal Grandmother   . AAA (abdominal aortic aneurysm) Paternal Grandfather     CURRENT MEDICATIONS:  Outpatient Encounter Medications as of 11/29/2017  Medication Sig  . alum & mag  hydroxide-simeth (MAALOX/MYLANTA) 200-200-20 MG/5ML suspension Take 30 mLs by mouth as needed for indigestion or heartburn.  Marland Kitchen amLODipine (NORVASC) 10 MG tablet Take 10 mg by mouth at bedtime.   . docusate sodium (COLACE) 100 MG capsule Take 1 capsule (100 mg total) by mouth 2 (two) times daily as needed for mild constipation.  Marland Kitchen doxylamine, Sleep, (UNISOM) 25 MG tablet Take 25 mg by mouth at bedtime as needed for sleep.  . fluconazole (DIFLUCAN) 100 MG tablet Take 2 tablets today then 1 tablet daily for 6 additional days orally  . FLUoxetine (PROZAC) 20 MG tablet Take 60 mg by mouth daily.   Marland Kitchen gabapentin (NEURONTIN) 300 MG capsule Take 1 capsule (300 mg total) by mouth 3 (three) times daily.  Marland Kitchen lidocaine-prilocaine (EMLA) cream Apply to affected area once  . loratadine (CLARITIN) 10 MG tablet Take 10 mg by mouth daily as needed for allergies.  Marland Kitchen lovastatin (MEVACOR) 20 MG tablet Take 20 mg by mouth at bedtime.  . ondansetron (ZOFRAN) 8 MG tablet Take 1 tablet (8 mg total) by mouth 2 (two) times daily as needed. Start on the third day after chemotherapy.  Marland Kitchen oxyCODONE (ROXICODONE) 5 MG immediate release tablet Take 1 tablet (5 mg total) by mouth every 4 (four) hours as needed for severe pain or breakthrough pain.  Marland Kitchen oxyCODONE (ROXICODONE) 5 MG immediate release tablet Take 1-2 tablets every 4-6 hours as needed for pain  . prochlorperazine (COMPAZINE) 10 MG tablet Take 1 tablet (10 mg total) by mouth every 6 (six) hours as needed (Nausea or vomiting).  . sodium fluoride (PREVIDENT 5000 PLUS) 1.1 % CREA dental cream Apply cream to tooth brush. Brush  teeth for 2 minutes. Spit out excess. Repeat nightly.  . [DISCONTINUED] gabapentin (NEURONTIN) 300 MG capsule Take 1 capsule (300 mg total) by mouth 3 (three) times daily.   Facility-Administered Encounter Medications as of 11/29/2017  Medication  . [COMPLETED] dextrose 5 % and 0.45% NaCl 1,000 mL with potassium chloride 20 mEq, magnesium sulfate 12 mEq infusion  . [COMPLETED] fosaprepitant (EMEND) 150 mg, dexamethasone (DECADRON) 12 mg in sodium chloride 0.9 % 145 mL IVPB  . [COMPLETED] palonosetron (ALOXI) injection 0.25 mg    ALLERGIES:  Allergies  Allergen Reactions  . Tetracyclines & Related Anxiety     PHYSICAL EXAM:  ECOG Performance status: 1-2 - Symptomatic; requires occasional assistance  BP 131/76 Pulse 74 Respirations 20 Temp 97.8 O2 sat 100%  Weight 194 lbs   Physical Exam  Constitutional: He is oriented to person, place, and time.  Seen in chemo chair in infusion area  -chronically-ill appearing male in NAD   HENT:  Head: Normocephalic.  No apparent evidence of thrush to tongue  Eyes: Conjunctivae are normal. No scleral icterus.  Neck: Normal range of motion.  Skin on neck is hyperpigmented and mildly erythematous.  Dry desquamation appreciated.   Cardiovascular: Normal rate and regular rhythm.  Pulmonary/Chest: Effort normal and breath sounds normal. No respiratory distress.  Abdominal: Soft. Bowel sounds are normal. There is no tenderness.  Musculoskeletal: He exhibits no edema.  RLE tender to palpation to anterior leg, calf area, and ankle.  No apparent wounds, edema, or erythema.   Lymphadenopathy:       Right: No supraclavicular adenopathy present.       Left: No supraclavicular adenopathy present.  Neurological: He is alert and oriented to person, place, and time. No cranial nerve deficit.  Skin: Skin is warm and dry. No rash noted.  Psychiatric: He  has a normal mood and affect. Judgment normal.  Nursing note and vitals reviewed.    LABORATORY  DATA:  I have reviewed the labs as listed.  CBC    Component Value Date/Time   WBC 3.2 (L) 11/29/2017 0916   RBC 3.10 (L) 11/29/2017 0916   HGB 9.2 (L) 11/29/2017 0916   HCT 28.0 (L) 11/29/2017 0916   PLT 188 11/29/2017 0916   MCV 90.3 11/29/2017 0916   MCH 29.7 11/29/2017 0916   MCHC 32.9 11/29/2017 0916   RDW 13.0 11/29/2017 0916   LYMPHSABS 0.9 11/29/2017 0916   MONOABS 0.5 11/29/2017 0916   EOSABS 0.0 11/29/2017 0916   BASOSABS 0.0 11/29/2017 0916   CMP Latest Ref Rng & Units 11/29/2017 11/22/2017 11/15/2017  Glucose 65 - 99 mg/dL 95 106(H) 110(H)  BUN 6 - 20 mg/dL 22(H) 13 14  Creatinine 0.61 - 1.24 mg/dL 1.33(H) 0.95 1.02  Sodium 135 - 145 mmol/L 130(L) 131(L) 135  Potassium 3.5 - 5.1 mmol/L 4.1 4.2 3.9  Chloride 101 - 111 mmol/L 90(L) 94(L) 98(L)  CO2 22 - 32 mmol/L 31 30 25   Calcium 8.9 - 10.3 mg/dL 9.0 8.9 9.2  Total Protein 6.5 - 8.1 g/dL 7.0 6.8 7.4  Total Bilirubin 0.3 - 1.2 mg/dL 0.3 0.7 0.8  Alkaline Phos 38 - 126 U/L 69 66 73  AST 15 - 41 U/L 26 39 22  ALT 17 - 63 U/L 19 21 21     PENDING LABS:    DIAGNOSTIC IMAGING:    PATHOLOGY:     ASSESSMENT & PLAN:   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  Labs reviewed and are adequate for treatment. Mild bump in creatinine noted; he will receive pre- and post-hydration per chemo orders/treatment plan.  WBCs mildly low at 3.2; ANC adequate at 1800.  Continues to have anemia, which is likely treatment effect. Hgb 9.2 g/dL today. Will keep monitoring.   -Return to cancer center in 1 week for follow-up and consideration for next cycle of treatment.   RLE neuropathic pain:  -He is on gabapentin 300 mg TID per med list. Unclear if patient actually taking as prescribed. Reviewed the purpose of this medication and how to take.  Recommended he take gabapentin TID for the next few weeks to see if he has any clinical improvement in his symptoms.  If he does not, then could  consider starting Cymbalta. It appears that this pain is less likely to be secondary to chemotherapy and more likely d/t recent reported trauma/injury about 5-6 weeks ago.    -Refill for gabapentin e-scribed to his pharmacy; helped make arrangements with nursing for his family to pick up medications as patient has significant transportation issues.       Dispo:  -Return to cancer center in 1 week for follow-up and consideration for next cycle of Cisplatin.    All questions were answered to patient's stated satisfaction. Encouraged patient to call with any new concerns or questions before his next visit to the cancer center and we can certain see him sooner, if needed.      Orders placed this encounter:  No orders of the defined types were placed in this encounter.     Mike Craze, NP [place] [phone]

## 2017-12-02 NOTE — Progress Notes (Addendum)
[place], [place] [address]   CLINIC:  Medical Oncology/Hematology  PCP:  Redmond School, MD [address] 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  CURRENT THERAPY: Concurrent chemoXRT with Cisplatin weekly   BRIEF ONCOLOGIC HISTORY:    Laryngeal cancer ([facility])   09/29/2017 Initial Diagnosis    Laryngeal cancer ([facility])      10/23/2017 - 12/12/2017 Chemotherapy    The patient had palonosetron (ALOXI) injection 0.25 mg, 0.25 mg, Intravenous,  Once, 7 of 7 cycles Administration: 0.25 mg (10/25/2017), 0.25 mg (11/01/2017), 0.25 mg (11/08/2017), 0.25 mg (11/15/2017), 0.25 mg (12/06/2017), 0.25 mg (11/22/2017), 0.25 mg (11/29/2017) CISplatin (PLATINOL) 85 mg in sodium chloride 0.9 % 250 mL chemo infusion, 40 mg/m2 = 85 mg, Intravenous,  Once, 7 of 7 cycles Dose modification: 32 mg/m2 (80 % of original dose 40 mg/m2, Cycle 4, Reason: Other (see comments), Comment: neuropathy), 25.6 mg/m2 (80 % of original dose 40 mg/m2, Cycle 7, Reason: Provider Judgment, Comment: progressive tinnitus ) Administration: 85 mg (10/25/2017), 85 mg (11/01/2017), 85 mg (11/08/2017), 68 mg (11/15/2017), 54 mg (12/06/2017), 68 mg (11/22/2017), 68 mg (11/29/2017) fosaprepitant (EMEND) 150 mg, dexamethasone (DECADRON) 12 mg in sodium chloride 0.9 % 145 mL IVPB, , Intravenous,  Once, 7 of 7 cycles Administration:  (10/25/2017),  (11/01/2017),  (11/08/2017),  (11/15/2017),  (12/06/2017),  (11/22/2017),  (11/29/2017)  for chemotherapy treatment.           INTERVAL HISTORY:  Mr. Say 57 y.o. male returns for routine follow-up and consideration for next cycle of chemotherapy.   Due for cycle #7 of Cisplatin today. He tells me he is due to finish his radiation on 12/16/17.   Overall, he tells me he has been doing "okay."  Appetite 50% and "so-so."  He tries to drink about 4 Ensure per day. States that "everything  tastes so bland."  He reports sore throat, which he attributes to the radiation. He continues to have some trouble swallowing, but is able to eat a variety of soft foods.  Chart reviewed and weight is down about 3 lbs in the past week.   Chart reviewed. He presented to ED yesterday, 12/05/17 with (R) leg pain and swelling; was found to have DVT in his RLE and was started on Xarelto. He tells me that he is taking the "starter pack" as prescribed.  He reports "spitting up some blood", which is worse in the mornings.  "It stops on its own though."  He attributes this to his radiation.  He notes that yesterday before he went to ED, he did have "very little streaks of blood" in his stool.  No other evidence of bleeding that he is aware of including blood in his urine or nosebleeds. The pain to his RLE is severe at times; it limits his ability to walk well.  States that he "has a walker now"; walking with cane today though.  Reports that he "fell 3 times yesterday", which he attributes to his leg pain.  No peripheral neuropathy noted to his left foot/leg or his fingertips.    Endorses occasional nausea; denies vomiting.  Reports occasional nausea as well; denies vomiting.    Overall, he feels ready for his last cycle of chemo today.     REVIEW OF SYSTEMS:  Review of Systems - Oncology Per HPI. Otherwise 14 point ROS reviewed and negative except as stated above.  PAST MEDICAL/SURGICAL HISTORY:  Past Medical History:  Diagnosis Date  . Anxiety   . Cancer ([facility])    laryngeal  . COPD (chronic obstructive pulmonary disease) ([facility])    pt states he was told he has COPD by health department  . Hypertension   . Recovering alcoholic [facility])    Past Surgical History:  Procedure Laterality Date  . BLADDER SURGERY    . MICROLARYNGOSCOPY N/A 09/20/2017   Procedure: MICRO LARYNGOSCOPY WITH BIOPSY OF LARYNGEAL MASS;  Surgeon: Leta Baptist, MD;  Location: [facility];  Service: ENT;  Laterality: N/A;  .  PORTACATH PLACEMENT Left 10/21/2017   Procedure: INSERTION PORT-A-CATH;  Surgeon: Virl Cagey, MD;  Location: [facility];  Service: General;  Laterality: Left;     SOCIAL HISTORY:  Social History   Socioeconomic History  . Marital status: Single    Spouse name: Not on file  . Number of children: Not on file  . Years of education: Not on file  . Highest education level: Not on file  Occupational History  . Not on file  Social Needs  . Financial resource strain: Not on file  . Food insecurity:    Worry: Not on file    Inability: Not on file  . Transportation needs:    Medical: Not on file    Non-medical: Not on file  Tobacco Use  . Smoking status: Current Some Day Smoker    Packs/day: 0.00    Types: Cigarettes  . Smokeless tobacco: Current User    Types: Snuff  . Tobacco comment: 2 cigarettes with coffee in the morning  Substance and Sexual Activity  . Alcohol use: No    Frequency: Never    Comment: in recovery x since 2013  . Drug use: No  . Sexual activity: Not Currently  Lifestyle  . Physical activity:    Days per week: Not on file    Minutes per session: Not on file  . Stress: Not on file  Relationships  . Social connections:    Talks on phone: Not on file    Gets together: Not on file    Attends religious service: Not on file    Active member of club or organization: Not on file    Attends meetings of clubs or organizations: Not on file    Relationship status: Not on file  . Intimate partner violence:    Fear of current or ex partner: Not on file    Emotionally abused: Not on file    Physically abused: Not on file    Forced sexual activity: Not on file  Other Topics Concern  . Not on file  Social History Narrative  . Not on file    FAMILY HISTORY:  Family History  Problem Relation Age of Onset  . Bladder Cancer Mother        10  . Colon cancer Father   . Thyroid disease Sister   . Heart disease Brother        stent placement  . Heart disease  Maternal Grandmother   . Diabetes Maternal Grandfather   . Cancer Paternal Grandmother   . AAA (abdominal aortic aneurysm) Paternal Grandfather     CURRENT MEDICATIONS:  Outpatient Encounter Medications as of 12/06/2017  Medication Sig  . alum & mag hydroxide-simeth (MAALOX/MYLANTA) 200-200-20 MG/5ML suspension Take 30 mLs by mouth as needed for indigestion or heartburn.  Marland Kitchen amLODipine (NORVASC) 10 MG tablet Take 10 mg by mouth at bedtime.   Marland Kitchen  docusate sodium (COLACE) 100 MG capsule Take 1 capsule (100 mg total) by mouth 2 (two) times daily as needed for mild constipation. (Patient not taking: Reported on 12/05/2017)  . doxylamine, Sleep, (UNISOM) 25 MG tablet Take 25 mg by mouth at bedtime as needed for sleep.  . fluconazole (DIFLUCAN) 100 MG tablet Take 2 tablets today then 1 tablet daily for 6 additional days orally  . FLUoxetine (PROZAC) 20 MG tablet Take 60 mg by mouth daily.   Marland Kitchen gabapentin (NEURONTIN) 300 MG capsule Take 1 capsule (300 mg total) by mouth 3 (three) times daily.  Marland Kitchen loratadine (CLARITIN) 10 MG tablet Take 10 mg by mouth daily as needed for allergies.  Marland Kitchen lovastatin (MEVACOR) 20 MG tablet Take 20 mg by mouth at bedtime.  Marland Kitchen oxyCODONE (ROXICODONE) 5 MG immediate release tablet Take 1 tablet (5 mg total) by mouth every 4 (four) hours as needed for severe pain or breakthrough pain.  Marland Kitchen oxyCODONE (ROXICODONE) 5 MG immediate release tablet Take 1-2 tablets every 4-6 hours as needed for pain  . Rivaroxaban 15 & 20 MG TBPK Take as directed on package: Start with one 15mg  tablet by mouth twice a day with food. On Day 22, switch to one 20mg  tablet once a day with food.  . sodium fluoride (PREVIDENT 5000 PLUS) 1.1 % CREA dental cream Apply cream to tooth brush. Brush teeth for 2 minutes. Spit out excess. Repeat nightly.  . [DISCONTINUED] lidocaine-prilocaine (EMLA) cream Apply to affected area once  . [DISCONTINUED] ondansetron (ZOFRAN) 8 MG tablet Take 1 tablet (8 mg total) by mouth 2  (two) times daily as needed. Start on the third day after chemotherapy.  . [DISCONTINUED] prochlorperazine (COMPAZINE) 10 MG tablet Take 1 tablet (10 mg total) by mouth every 6 (six) hours as needed (Nausea or vomiting).  . [EXPIRED] dextrose 5 % and 0.45% NaCl 1,000 mL with potassium chloride 20 mEq, magnesium sulfate 12 mEq infusion    No facility-administered encounter medications on file as of 12/06/2017.     ALLERGIES:  Allergies  Allergen Reactions  . Tetracyclines & Related Anxiety     PHYSICAL EXAM:  ECOG Performance status: 1-2 - Symptomatic; requires occasional assistance  BP 132/70 Pulse 89  Temp 98.0 O2 sat 100%  Weight 191.6    Physical Exam  Constitutional: He is oriented to person, place, and time.  Chronically-ill appearing male in no acute distress; seen in chemo chair in treatment/infusion area.   HENT:  Head: Normocephalic.  Tongue with dark coating, but no apparent evidence of thrush -Small lesion to inside of lower lip/mucosa. No evidence of infection   Eyes: Conjunctivae are normal. No scleral icterus.  Neck:  Skin to neck is hyperpigmented and erythematous. No evidence of moist desquamation.   Cardiovascular: Normal rate and regular rhythm.  Pulmonary/Chest: Effort normal. No respiratory distress. He has no wheezes.  Rhonchi noted to all lung fields. Diminished breath sounds bilat bases   Abdominal: Soft. Bowel sounds are normal. There is no tenderness.  Musculoskeletal:  Ambulating with cane today  -(R) LE with pain/tenderness to touch. Very subtle edema. No skin changes apparent   Neurological: He is alert and oriented to person, place, and time.  Skin: Skin is warm and dry.  Psychiatric: He has a normal mood and affect. Judgment normal.  Nursing note and vitals reviewed.    LABORATORY DATA:  I have reviewed the labs as listed.  CBC    Component Value Date/Time   WBC 4.2 12/06/2017 0927  RBC 2.89 (L) 12/06/2017 0927   HGB 8.7 (L)  12/06/2017 0927   HCT 26.2 (L) 12/06/2017 0927   PLT 233 12/06/2017 0927   MCV 90.7 12/06/2017 0927   MCH 30.1 12/06/2017 0927   MCHC 33.2 12/06/2017 0927   RDW 13.8 12/06/2017 0927   LYMPHSABS 1.0 12/06/2017 0927   MONOABS 0.8 12/06/2017 0927   EOSABS 0.0 12/06/2017 0927   BASOSABS 0.0 12/06/2017 0927   CMP Latest Ref Rng & Units 12/06/2017 12/05/2017 11/29/2017  Glucose 65 - 99 mg/dL 110(H) 101(H) 95  BUN 6 - 20 mg/dL 27(H) 25(H) 22(H)  Creatinine 0.61 - 1.24 mg/dL 1.65(H) 1.20 1.33(H)  Sodium 135 - 145 mmol/L 130(L) 133(L) 130(L)  Potassium 3.5 - 5.1 mmol/L 4.0 3.7 4.1  Chloride 101 - 111 mmol/L 90(L) 92(L) 90(L)  CO2 22 - 32 mmol/L 30 30 31   Calcium 8.9 - 10.3 mg/dL 8.8(L) 9.1 9.0  Total Protein 6.5 - 8.1 g/dL 7.1 - 7.0  Total Bilirubin 0.3 - 1.2 mg/dL 0.6 - 0.3  Alkaline Phos 38 - 126 U/L 64 - 69  AST 15 - 41 U/L 25 - 26  ALT 17 - 63 U/L 17 - 19    PENDING LABS:    DIAGNOSTIC IMAGING:    PATHOLOGY:     ASSESSMENT & PLAN:   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  Cisplatin was dose-reduced by 20% with cycle #4 d/t peripheral neuropathy.  Creatinine mildly elevated at 1.65 today. He will receive pre- and post-hydration per chemo orders/treatment plan.  Will also replete magnesium with 2 gm additional IV mag sulfate today for low serum mag of 1.3 today.  Also reports worsening tinnitus.  Hgb continues to slowly drop and is 8.7 g/dL today. May be secondary to chemotherapy.   -Discussed with Dr. Walden Field ([facility] oncologist).  Will further dose-reduce his Cisplatin by an additional 20% for this final cycle of chemo today.  -I will ask nursing to call patient in 1 week to see how he is feeling and offer him to come in for IV fluids/supportive care.   -Return to cancer center in 2 weeks for follow-up and supportive care; I will ask scheduling to reserve appt for IVF just in case patient requires additional IV hydration at  that time.     RLE DVT and RLE pain:  -Diagnosed on 12/05/17 after visit to ED. He was started on Xarelto; currently on 15 mg BID as prescribed. Continue as directed. -Reviewed bleeding precautions with patient. Reinforced the importance of seeking emergency medical care for any diffuse bleeding/trauma, particularly to his head.   -Pain to his RLE is likely multifactorial, but symptoms more suggestive of neuropathic origin (burning, numbness). Gabapentin 300 mg TID not helpful; he shows me the pill bottle today and states that he has been taking it regularly.   -Based on [place] Controlled Substance Reporting System, he was given #90 pills of oxycodone 5 mg on 11/28/17 (filled on 12/05/17) by Dr. Quitman Livings with radiation oncology in [place].  Therefore, will not give any additional opiate medications at this time.     Falls precautions:  -Reinforced the importance of his safety, particularly now that he is on anticoagulation. States that he now has a walker. Encouraged him to use extreme caution when ambulating and follow bleeding precautions (as discussed above).       Dispo:  -Return to cancer center in 2 weeks for follow-up/supportive care with labs.  Will add-on anemia panel at that time.    All questions were answered to patient's stated satisfaction. Encouraged patient to call with any new concerns or questions before his next visit to the cancer center and we can certain see him sooner, if needed.      Orders placed this encounter:  Orders Placed This Encounter  Procedures  . Vitamin B12  . Folate  . Iron and TIBC  . Ferritin  . CBC with Differential/Platelet  . Basic metabolic panel      Mike Craze, NP [place] [phone]

## 2017-12-02 NOTE — Progress Notes (Deleted)
[place], [place] [address]   CLINIC:  Medical Oncology/Hematology  PCP:  Redmond School, MD [address] [place] [address] [phone]   REASON FOR VISIT:  Follow-up for ***  CURRENT THERAPY: ***  BRIEF ONCOLOGIC HISTORY:    Laryngeal cancer ([facility])   09/29/2017 Initial Diagnosis    Laryngeal cancer ([facility])      10/23/2017 -  Chemotherapy    The patient had palonosetron (ALOXI) injection 0.25 mg, 0.25 mg, Intravenous,  Once, 6 of 7 cycles Administration: 0.25 mg (10/25/2017), 0.25 mg (11/01/2017), 0.25 mg (11/08/2017), 0.25 mg (11/15/2017), 0.25 mg (11/22/2017), 0.25 mg (11/29/2017) CISplatin (PLATINOL) 85 mg in sodium chloride 0.9 % 250 mL chemo infusion, 40 mg/m2 = 85 mg, Intravenous,  Once, 6 of 7 cycles Dose modification: 32 mg/m2 (80 % of original dose 40 mg/m2, Cycle 4, Reason: Other (see comments), Comment: neuropathy) Administration: 85 mg (10/25/2017), 85 mg (11/01/2017), 85 mg (11/08/2017), 68 mg (11/15/2017), 68 mg (11/22/2017), 68 mg (11/29/2017) fosaprepitant (EMEND) 150 mg, dexamethasone (DECADRON) 12 mg in sodium chloride 0.9 % 145 mL IVPB, , Intravenous,  Once, 6 of 7 cycles Administration:  (10/25/2017),  (11/01/2017),  (11/08/2017),  (11/15/2017),  (11/22/2017),  (11/29/2017)  for chemotherapy treatment.           INTERVAL HISTORY:  Timothy Parks 57 y.o. male returns for routine follow-up and consideration for next cycle of chemotherapy.   Due for cycle #*** of *** today.   Overall, he tells me he has been feeling pretty well. Energy levels ***; appetite ***.   Overall, he feels ready for next cycle of chemo today.     REVIEW OF SYSTEMS:  Review of Systems - Oncology   PAST MEDICAL/SURGICAL HISTORY:  Past Medical History:  Diagnosis Date  . Anxiety   . Cancer ([facility])    laryngeal  . COPD (chronic obstructive pulmonary disease) ([facility])    pt states he was told he has COPD by health department  . Hypertension   .  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 BLADDER SURGERY    . MICROLARYNGOSCOPY N/A 09/20/2017   Procedure: MICRO LARYNGOSCOPY WITH BIOPSY OF LARYNGEAL MASS;  Surgeon: Leta Baptist, MD;  Location: [facility];  Service: ENT;  Laterality: N/A;  . PORTACATH PLACEMENT Left 10/21/2017   Procedure: INSERTION PORT-A-CATH;  Surgeon: Virl Cagey, MD;  Location: [facility];  Service: General;  Laterality: Left;     SOCIAL HISTORY:  Social History   Socioeconomic History  . Marital status: Single    Spouse name: Not on file  . Number of children: Not on file  . Years of education: Not on file  . Highest education level: Not on file  Occupational History  . Not on file  Social Needs  . Financial resource strain: Not on file  . Food insecurity:    Worry: Not on file    Inability: Not on file  . Transportation needs:    Medical: Not on file    Non-medical: Not on file  Tobacco Use  . Smoking status: Current Some Day Smoker    Packs/day: 0.00    Types: Cigarettes  . Smokeless tobacco: Current User    Types: Snuff  . Tobacco comment: 2 cigarettes with coffee in the morning  Substance and Sexual Activity  . Alcohol use: No    Frequency: Never    Comment: in recovery x since 2013  .  Drug use: No  . Sexual activity: Not Currently  Lifestyle  . Physical activity:    Days per week: Not on file    Minutes per session: Not on file  . Stress: Not on file  Relationships  . Social connections:    Talks on phone: Not on file    Gets together: Not on file    Attends religious service: Not on file    Active member of club or organization: Not on file    Attends meetings of clubs or organizations: Not on file    Relationship status: Not on file  . Intimate partner violence:    Fear of current or ex partner: Not on file    Emotionally abused: Not on file    Physically abused: Not on file    Forced sexual activity: Not on file  Other  Topics Concern  . Not on file  Social History Narrative  . Not on file    FAMILY HISTORY:  Family History  Problem Relation Age of Onset  . Bladder Cancer Mother        39  . Colon cancer Father   . Thyroid disease Sister   . Heart disease Brother        stent placement  . Heart disease Maternal Grandmother   . Diabetes Maternal Grandfather   . Cancer Paternal Grandmother   . AAA (abdominal aortic aneurysm) Paternal Grandfather     CURRENT MEDICATIONS:  Outpatient Encounter Medications as of 12/06/2017  Medication Sig  . alum & mag hydroxide-simeth (MAALOX/MYLANTA) 200-200-20 MG/5ML suspension Take 30 mLs by mouth as needed for indigestion or heartburn.  Marland Kitchen amLODipine (NORVASC) 10 MG tablet Take 10 mg by mouth at bedtime.   . docusate sodium (COLACE) 100 MG capsule Take 1 capsule (100 mg total) by mouth 2 (two) times daily as needed for mild constipation.  Marland Kitchen doxylamine, Sleep, (UNISOM) 25 MG tablet Take 25 mg by mouth at bedtime as needed for sleep.  . fluconazole (DIFLUCAN) 100 MG tablet Take 2 tablets today then 1 tablet daily for 6 additional days orally  . FLUoxetine (PROZAC) 20 MG tablet Take 60 mg by mouth daily.   Marland Kitchen gabapentin (NEURONTIN) 300 MG capsule Take 1 capsule (300 mg total) by mouth 3 (three) times daily.  Marland Kitchen lidocaine-prilocaine (EMLA) cream Apply to affected area once  . loratadine (CLARITIN) 10 MG tablet Take 10 mg by mouth daily as needed for allergies.  Marland Kitchen lovastatin (MEVACOR) 20 MG tablet Take 20 mg by mouth at bedtime.  . ondansetron (ZOFRAN) 8 MG tablet Take 1 tablet (8 mg total) by mouth 2 (two) times daily as needed. Start on the third day after chemotherapy.  Marland Kitchen oxyCODONE (ROXICODONE) 5 MG immediate release tablet Take 1 tablet (5 mg total) by mouth every 4 (four) hours as needed for severe pain or breakthrough pain.  Marland Kitchen oxyCODONE (ROXICODONE) 5 MG immediate release tablet Take 1-2 tablets every 4-6 hours as needed for pain  . prochlorperazine (COMPAZINE)  10 MG tablet Take 1 tablet (10 mg total) by mouth every 6 (six) hours as needed (Nausea or vomiting).  . sodium fluoride (PREVIDENT 5000 PLUS) 1.1 % CREA dental cream Apply cream to tooth brush. Brush teeth for 2 minutes. Spit out excess. Repeat nightly.   No facility-administered encounter medications on file as of 12/06/2017.     ALLERGIES:  Allergies  Allergen Reactions  . Tetracyclines & Related Anxiety     PHYSICAL EXAM:  ECOG Performance status: ***  There were no vitals filed for this visit. There were no vitals filed for this visit.  Physical Exam   LABORATORY DATA:  I have reviewed the labs as listed.  CBC    Component Value Date/Time   WBC 3.2 (L) 11/29/2017 0916   RBC 3.10 (L) 11/29/2017 0916   HGB 9.2 (L) 11/29/2017 0916   HCT 28.0 (L) 11/29/2017 0916   PLT 188 11/29/2017 0916   MCV 90.3 11/29/2017 0916   MCH 29.7 11/29/2017 0916   MCHC 32.9 11/29/2017 0916   RDW 13.0 11/29/2017 0916   LYMPHSABS 0.9 11/29/2017 0916   MONOABS 0.5 11/29/2017 0916   EOSABS 0.0 11/29/2017 0916   BASOSABS 0.0 11/29/2017 0916   CMP Latest Ref Rng & Units 11/29/2017 11/22/2017 11/15/2017  Glucose 65 - 99 mg/dL 95 106(H) 110(H)  BUN 6 - 20 mg/dL 22(H) 13 14  Creatinine 0.61 - 1.24 mg/dL 1.33(H) 0.95 1.02  Sodium 135 - 145 mmol/L 130(L) 131(L) 135  Potassium 3.5 - 5.1 mmol/L 4.1 4.2 3.9  Chloride 101 - 111 mmol/L 90(L) 94(L) 98(L)  CO2 22 - 32 mmol/L 31 30 25   Calcium 8.9 - 10.3 mg/dL 9.0 8.9 9.2  Total Protein 6.5 - 8.1 g/dL 7.0 6.8 7.4  Total Bilirubin 0.3 - 1.2 mg/dL 0.3 0.7 0.8  Alkaline Phos 38 - 126 U/L 69 66 73  AST 15 - 41 U/L 26 39 22  ALT 17 - 63 U/L 19 21 21     PENDING LABS:    DIAGNOSTIC IMAGING:  *The following radiologic images and reports have been reviewed independently and agree with below findings.  ***  PATHOLOGY:  ***   ASSESSMENT & PLAN:   ***      Dispo:  -   All questions were answered to patient's stated satisfaction. Encouraged  patient to call with any new concerns or questions before his next visit to the cancer center and we can certain see him sooner, if needed.    Plan of care discussed with Dr. ***, who agrees with the above aforementioned.    Orders placed this encounter:  No orders of the defined types were placed in this encounter.     Mike Craze, NP [facility] [phone]

## 2017-12-05 ENCOUNTER — Other Ambulatory Visit: Payer: Self-pay

## 2017-12-05 ENCOUNTER — Emergency Department (HOSPITAL_COMMUNITY)
Admission: EM | Admit: 2017-12-05 | Discharge: 2017-12-05 | Disposition: A | Discharge status: Home / Self Care | Payer: Medicaid Other | Attending: Emergency Medicine | Admitting: Emergency Medicine

## 2017-12-05 ENCOUNTER — Encounter (HOSPITAL_COMMUNITY): Payer: Self-pay

## 2017-12-05 ENCOUNTER — Emergency Department (HOSPITAL_COMMUNITY): Payer: Medicaid Other

## 2017-12-05 DIAGNOSIS — Z7901 Long term (current) use of anticoagulants: Secondary | ICD-10-CM | POA: Insufficient documentation

## 2017-12-05 DIAGNOSIS — Z79899 Other long term (current) drug therapy: Secondary | ICD-10-CM | POA: Insufficient documentation

## 2017-12-05 DIAGNOSIS — I10 Essential (primary) hypertension: Secondary | ICD-10-CM | POA: Diagnosis not present

## 2017-12-05 DIAGNOSIS — Z859 Personal history of malignant neoplasm, unspecified: Secondary | ICD-10-CM | POA: Diagnosis not present

## 2017-12-05 DIAGNOSIS — I824Y1 Acute embolism and thrombosis of unspecified deep veins of right proximal lower extremity: Secondary | ICD-10-CM | POA: Diagnosis not present

## 2017-12-05 DIAGNOSIS — J449 Chronic obstructive pulmonary disease, unspecified: Secondary | ICD-10-CM | POA: Insufficient documentation

## 2017-12-05 DIAGNOSIS — F17210 Nicotine dependence, cigarettes, uncomplicated: Secondary | ICD-10-CM | POA: Diagnosis not present

## 2017-12-05 DIAGNOSIS — M79604 Pain in right leg: Secondary | ICD-10-CM | POA: Diagnosis present

## 2017-12-05 LAB — BASIC METABOLIC PANEL
ANION GAP: 11 (ref 5–15)
BUN: 25 mg/dL — ABNORMAL HIGH (ref 6–20)
CHLORIDE: 92 mmol/L — AB (ref 101–111)
CO2: 30 mmol/L (ref 22–32)
Calcium: 9.1 mg/dL (ref 8.9–10.3)
Creatinine, Ser: 1.2 mg/dL (ref 0.61–1.24)
GFR calc non Af Amer: >60 mL/min (ref >60)
Glucose, Bld: 101 mg/dL — ABNORMAL HIGH (ref 65–99)
POTASSIUM: 3.7 mmol/L (ref 3.5–5.1)
Sodium: 133 mmol/L — ABNORMAL LOW (ref 135–145)

## 2017-12-05 LAB — CBC WITH DIFFERENTIAL/PLATELET
BASOS ABS: 0 10*3/uL (ref 0.0–0.1)
BASOS PCT: 0 %
Eosinophils Absolute: 0 10*3/uL (ref 0.0–0.7)
Eosinophils Relative: 0 %
HEMATOCRIT: 27.8 % — AB (ref 39.0–52.0)
Hemoglobin: 9.2 g/dL — ABNORMAL LOW (ref 13.0–17.0)
Lymphocytes Relative: 24 %
Lymphs Abs: 0.7 10*3/uL (ref 0.7–4.0)
MCH: 30 pg (ref 26.0–34.0)
MCHC: 33.1 g/dL (ref 30.0–36.0)
MCV: 90.6 fL (ref 78.0–100.0)
MONOS PCT: 24 %
Monocytes Absolute: 0.7 10*3/uL (ref 0.1–1.0)
NEUTROS PCT: 52 %
Neutro Abs: 1.5 10*3/uL — ABNORMAL LOW (ref 1.7–7.7)
Platelets: 234 10*3/uL (ref 150–400)
RBC: 3.07 MIL/uL — AB (ref 4.22–5.81)
RDW: 13.7 % (ref 11.5–15.5)
WBC: 3 10*3/uL — ABNORMAL LOW (ref 4.0–10.5)

## 2017-12-05 MED ORDER — RIVAROXABAN (XARELTO) VTE STARTER PACK (15 & 20 MG): ORAL_TABLET | ORAL | 0 refills | Status: DC

## 2017-12-05 MED ORDER — RIVAROXABAN 15 MG PO TABS
15.0000 mg | ORAL_TABLET | Freq: Once | ORAL | Status: AC
  Administered 2017-12-05: 15 mg via ORAL
  Filled 2017-12-05: qty 1
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
## 2017-12-05 NOTE — Discharge Instructions (Signed)
Xarelto as prescribed. Recheck with any significant bleeding-coughing up blood, vomiting blood, blood in stools, blood in urine. Call your primary care physician and/or oncologist for follow-up appointments within the next 7 to 10 days.

## 2017-12-06 ENCOUNTER — Encounter (HOSPITAL_COMMUNITY): Payer: Self-pay

## 2017-12-06 ENCOUNTER — Encounter (HOSPITAL_COMMUNITY): Payer: Self-pay | Admitting: Internal Medicine

## 2017-12-06 ENCOUNTER — Inpatient Hospital Stay (HOSPITAL_BASED_OUTPATIENT_CLINIC_OR_DEPARTMENT_OTHER): Payer: Medicaid Other | Admitting: Adult Health

## 2017-12-06 ENCOUNTER — Inpatient Hospital Stay (HOSPITAL_COMMUNITY): Payer: Medicaid Other | Admitting: Dietician

## 2017-12-06 ENCOUNTER — Encounter (HOSPITAL_COMMUNITY): Payer: Self-pay | Admitting: Adult Health

## 2017-12-06 ENCOUNTER — Inpatient Hospital Stay (HOSPITAL_COMMUNITY): Payer: Medicaid Other

## 2017-12-06 ENCOUNTER — Other Ambulatory Visit: Payer: Self-pay

## 2017-12-06 VITALS — BP 109/95 | HR 79 | Temp 98.2°F | Resp 18 | Wt 191.6 lb

## 2017-12-06 DIAGNOSIS — I82401 Acute embolism and thrombosis of unspecified deep veins of right lower extremity: Secondary | ICD-10-CM | POA: Diagnosis not present

## 2017-12-06 DIAGNOSIS — C329 Malignant neoplasm of larynx, unspecified: Secondary | ICD-10-CM | POA: Diagnosis not present

## 2017-12-06 DIAGNOSIS — M79661 Pain in right lower leg: Secondary | ICD-10-CM | POA: Diagnosis not present

## 2017-12-06 DIAGNOSIS — Z5111 Encounter for antineoplastic chemotherapy: Secondary | ICD-10-CM

## 2017-12-06 DIAGNOSIS — F17210 Nicotine dependence, cigarettes, uncomplicated: Secondary | ICD-10-CM

## 2017-12-06 DIAGNOSIS — H9319 Tinnitus, unspecified ear: Secondary | ICD-10-CM | POA: Diagnosis not present

## 2017-12-06 DIAGNOSIS — D649 Anemia, unspecified: Secondary | ICD-10-CM | POA: Diagnosis not present

## 2017-12-06 DIAGNOSIS — I10 Essential (primary) hypertension: Secondary | ICD-10-CM | POA: Diagnosis not present

## 2017-12-06 DIAGNOSIS — Z7901 Long term (current) use of anticoagulants: Secondary | ICD-10-CM

## 2017-12-06 LAB — CBC WITH DIFFERENTIAL/PLATELET
BASOS PCT: 0 %
Basophils Absolute: 0 10*3/uL (ref 0.0–0.1)
EOS ABS: 0 10*3/uL (ref 0.0–0.7)
Eosinophils Relative: 0 %
HCT: 26.2 % — ABNORMAL LOW (ref 39.0–52.0)
Hemoglobin: 8.7 g/dL — ABNORMAL LOW (ref 13.0–17.0)
LYMPHS ABS: 1 10*3/uL (ref 0.7–4.0)
Lymphocytes Relative: 23 %
MCH: 30.1 pg (ref 26.0–34.0)
MCHC: 33.2 g/dL (ref 30.0–36.0)
MCV: 90.7 fL (ref 78.0–100.0)
MONOS PCT: 20 %
Monocytes Absolute: 0.8 10*3/uL (ref 0.1–1.0)
Neutro Abs: 2.4 10*3/uL (ref 1.7–7.7)
Neutrophils Relative %: 57 %
Platelets: 233 10*3/uL (ref 150–400)
RBC: 2.89 MIL/uL — AB (ref 4.22–5.81)
RDW: 13.8 % (ref 11.5–15.5)
WBC: 4.2 10*3/uL (ref 4.0–10.5)

## 2017-12-06 LAB — COMPREHENSIVE METABOLIC PANEL
ALK PHOS: 64 U/L (ref 38–126)
ALT: 17 U/L (ref 17–63)
AST: 25 U/L (ref 15–41)
Albumin: 3.6 g/dL (ref 3.5–5.0)
Anion gap: 10 (ref 5–15)
BUN: 27 mg/dL — AB (ref 6–20)
CALCIUM: 8.8 mg/dL — AB (ref 8.9–10.3)
CHLORIDE: 90 mmol/L — AB (ref 101–111)
CO2: 30 mmol/L (ref 22–32)
CREATININE: 1.65 mg/dL — AB (ref 0.61–1.24)
GFR calc non Af Amer: 45 mL/min — ABNORMAL LOW (ref >60)
GFR, EST AFRICAN AMERICAN: 52 mL/min — AB (ref >60)
Glucose, Bld: 110 mg/dL — ABNORMAL HIGH (ref 65–99)
Potassium: 4 mmol/L (ref 3.5–5.1)
Sodium: 130 mmol/L — ABNORMAL LOW (ref 135–145)
Total Bilirubin: 0.6 mg/dL (ref 0.3–1.2)
Total Protein: 7.1 g/dL (ref 6.5–8.1)

## 2017-12-06 LAB — MAGNESIUM: MAGNESIUM: 1.3 mg/dL — AB (ref 1.7–2.4)

## 2017-12-06 MED ORDER — SODIUM CHLORIDE 0.9 % IV SOLN
Freq: Once | INTRAVENOUS | Status: AC
  Administered 2017-12-06: 12:00:00 via INTRAVENOUS
  Filled 2017-12-06: qty 5

## 2017-12-06 MED ORDER — SODIUM CHLORIDE 0.9 % IV SOLN: 2.0000 g | Freq: Once | INTRAVENOUS | Status: DC

## 2017-12-06 MED ORDER — MAGNESIUM SULFATE 2 GM/50ML IV SOLN
2.0000 g | Freq: Once | INTRAVENOUS | Status: AC
  Administered 2017-12-06: 2 g via INTRAVENOUS
  Filled 2017-12-06: qty 50

## 2017-12-06 MED ORDER — SODIUM CHLORIDE 0.9 % IV SOLN
25.6000 mg/m2 | Freq: Once | INTRAVENOUS | Status: AC
  Administered 2017-12-06: 54 mg via INTRAVENOUS
  Filled 2017-12-06: qty 54

## 2017-12-06 MED ORDER — HEPARIN SOD (PORK) LOCK FLUSH 100 UNIT/ML IV SOLN
500.0000 [IU] | Freq: Once | INTRAVENOUS | Status: AC | PRN
  Administered 2017-12-06: 500 [IU]

## 2017-12-06 MED ORDER — SODIUM CHLORIDE 0.9 % IV SOLN
INTRAVENOUS | Status: DC
  Administered 2017-12-06: 11:00:00 via INTRAVENOUS

## 2017-12-06 MED ORDER — POTASSIUM CHLORIDE 2 MEQ/ML IV SOLN
Freq: Once | INTRAVENOUS | Status: AC
  Administered 2017-12-06: 10:00:00 via INTRAVENOUS
  Filled 2017-12-06: qty 10

## 2017-12-06 MED ORDER — PALONOSETRON HCL INJECTION 0.25 MG/5ML
0.2500 mg | Freq: Once | INTRAVENOUS | Status: AC
  Administered 2017-12-06: 0.25 mg via INTRAVENOUS
  Filled 2017-12-06: qty 5
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
## 2017-12-06 NOTE — Addendum Note (Signed)
Addended by: Holley Bouche on: 12/06/2017 05:04 PM   Modules accepted: Orders

## 2017-12-06 NOTE — Progress Notes (Signed)
Nutrition Assessment  Reason for Assessment: New Head/neck cancer patient  ASSESSMENT:  57 y/o male PMHx tobacco/etoh abuse, HTN, Anxiety. Underwent biopsy of a neck mass on 3/5, which he had reported having x4 months. Had intermittent throat pain, no dysphagia or weight loss. Biopsy showed SCC. Began concomitant chemoradiation 4/9. Dose 2- 4/16, Dose 3 4/23, Dose 4. 4/30 (decreased strength), Dose 5 (5/07), Dose 6 (5/14)  Patient seen in f/u today. This is his final chemo tx.  Since last seen, patient presented to ED w/ ankle pain. He was found to have a DVT. He reports having severe pain is his foot because of it. This is his major complaint and he perseverates on this.   Unable to discern intake history, but does not sound to be eating any large amounts of food. He reports relying on Ensure, though he also says he still has the case RD gave to him last week.   Denies any n/v/c/d or dysphagia. He still has no taste and, despite RD educating against, he again states not wanting to add salt to food due to worry about HTN. 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   He reports he is still performing strenuous labor at home.   Wt wise, he has dropped another 3 lbs in the last week. He says he has had to make another notch on his belt. He has lost 15 lbs (7.3% bw) in last month   Wt Readings from Last 10 Encounters:  12/06/17 191 lb 9.6 oz (86.9 kg)  12/05/17 193 lb (87.5 kg)  11/29/17 194 lb (88 kg)  11/22/17 197 lb 9.6 oz (89.6 kg)  11/15/17 199 lb 12.8 oz (90.6 kg)  11/08/17 206 lb 6.4 oz (93.6 kg)  11/01/17 207 lb 12.8 oz (94.3 kg)  10/25/17 206 lb 11.2 oz (93.8 kg)  10/21/17 204 lb (92.5 kg)  10/17/17 204 lb (92.5 kg)   NUTRITION - FOCUSED PHYSICAL EXAM: Obese, perhaps mild orbital fat loss  MEDICATIONS:  Chemo: Cisplatin weekly x 6-7 weeks Supportive meds: Compazine, Colace, Zorfan, oxycodone Other: Diflucan  Labs: Hgb down to 8.7 from 9.2, BUN/Creat continuing to  increas. Now 27/1.65, Na: 130  LABS:  Recent Labs  Lab 12/05/17 0943 12/06/17 0927  NA 133* 130*  K 3.7 4.0  CL 92* 90*  CO2 30 30  BUN 25* 27*  CREATININE 1.20 1.65*  CALCIUM 9.1 8.8*  MG  --  1.3*  GLUCOSE 101* 110*   ANTHROPOMETRICS: Height: _0  (170.18 cm) Weight: 191 lbs 10 oz  (87.1 kg)  BMI: 30-obese Change: -2-3 lbs x1 wk  ESTIMATED ENERGY NEEDS:  Kcal: 2350-2550 (35-38 kcal/kg ibw) Protein: 100- 115g Pro (1.5-1.7 g/kg ibw) Fluid: 2.4-2.6 L fluid (1 ml/kcal)   NUTRITION DIAGNOSIS:  Inadequate oral intake r/t poor appetite/lack of understanding/knowledge regarding appropriate eating habits/behaviors AEB loss of 15 lbs (7.3%) x 21month  DOCUMENTATION CODES:  N/A - does meet wt loss criteria x1 month, but no other met criteria available  INTERVENTION:  Unfortunately, RD continues to make little, if any, progress on educating patient on appropriate eating behaviors. Today he fidgets throughout our conversation and discusses unrelated events.   RD re-educates on importance of maintaining weight and recommends he adds salt, sugar or condiments to items as needed if he will eat it. He still is worried about his HTN. He should not be concerned with this.   Stressed continued intake of supplements if he does not eat his meals well. He  denies needing another case of Ensure.   He still reports participating in strenuous activity, which is certainly not helping him maintain his weight. He says if he doesn't move all his items from his previous house, they will be repossessed. RD recommended only doing what was doing absolutely necessary as he is burning extra kcals and exhausting himself. He has no support to help him, "this wall is as likely to offer me help as anyone else".   He has a DVT in his R leg and he spends much time discussing this.   While RD continually tried to redirect patient and review importance of improved intake, much of discussion was again relegated to  supportive listening. Fortunately, this is his final chemo tx. He apparently has 10 more days radiation.   RD tries to encourage improved intake by stating his recovery will take longer if he is malnourished.  GOAL:  Oral intake to meet >90% needs, wt stability, Patient will show understanding of need to maintain weight  NOT MET  MONITOR:  Weight, diet tolerance, s/s aspiration, chemo tolerance  Next Visit:  By phone is 3-4 weeks.   Burtis Junes RD, LDN, CNSC Clinical Nutrition Available Tues-Sat via Pager: [phone] 12/06/2017 2:08 PM

## 2017-12-06 NOTE — Progress Notes (Signed)
Tolerated infusions w/o adverse reaction.  Alert, in no distress.  VSS.  Discharged via wheelchair in c/o family.  

## 2017-12-22 ENCOUNTER — Other Ambulatory Visit (HOSPITAL_COMMUNITY): Payer: Self-pay

## 2017-12-22 ENCOUNTER — Ambulatory Visit (HOSPITAL_COMMUNITY): Payer: Self-pay | Admitting: Hematology

## 2017-12-22 ENCOUNTER — Ambulatory Visit (HOSPITAL_COMMUNITY): Payer: Self-pay

## 2017-12-27 ENCOUNTER — Encounter (HOSPITAL_COMMUNITY): Payer: Self-pay | Admitting: Dietician

## 2017-12-27 NOTE — Progress Notes (Signed)
Nutrition Brief Note  RD reached out to patient for remote follow up. He had finished chemo 5/21 and radiation some time around 1st of month.  There is no documentation in chart ([facility] system via Care everywhere) since last seen 5/21  RD called number on file, but received VM. RD noted he was calling to check in on patient and see if his nutritional status has improved since finishing tx or if he is still having significant side effects and poor intake. RD noted he is available to provide recommendations if needed.   Call back number left.   Burtis Junes RD, LDN, CNSC Clinical Nutrition Available Tues-Sat via Pager: [phone] 12/27/2017 1:00 PM

## 2018-01-03 ENCOUNTER — Telehealth (HOSPITAL_COMMUNITY): Payer: Self-pay

## 2018-01-06 ENCOUNTER — Emergency Department (HOSPITAL_COMMUNITY)
Admission: EM | Admit: 2018-01-06 | Discharge: 2018-01-06 | Disposition: A | Discharge status: Home / Self Care | Payer: Medicaid Other | Attending: Emergency Medicine | Admitting: Emergency Medicine

## 2018-01-06 ENCOUNTER — Other Ambulatory Visit: Payer: Self-pay

## 2018-01-06 ENCOUNTER — Encounter (HOSPITAL_COMMUNITY): Payer: Self-pay

## 2018-01-06 DIAGNOSIS — Z7901 Long term (current) use of anticoagulants: Secondary | ICD-10-CM | POA: Insufficient documentation

## 2018-01-06 DIAGNOSIS — Z760 Encounter for issue of repeat prescription: Secondary | ICD-10-CM | POA: Insufficient documentation

## 2018-01-06 DIAGNOSIS — F17228 Nicotine dependence, chewing tobacco, with other nicotine-induced disorders: Secondary | ICD-10-CM | POA: Insufficient documentation

## 2018-01-06 DIAGNOSIS — I82401 Acute embolism and thrombosis of unspecified deep veins of right lower extremity: Secondary | ICD-10-CM | POA: Diagnosis not present

## 2018-01-06 DIAGNOSIS — I10 Essential (primary) hypertension: Secondary | ICD-10-CM

## 2018-01-06 DIAGNOSIS — M79604 Pain in right leg: Secondary | ICD-10-CM | POA: Diagnosis not present

## 2018-01-06 DIAGNOSIS — J449 Chronic obstructive pulmonary disease, unspecified: Secondary | ICD-10-CM | POA: Insufficient documentation

## 2018-01-06 DIAGNOSIS — Z8521 Personal history of malignant neoplasm of larynx: Secondary | ICD-10-CM | POA: Insufficient documentation

## 2018-01-06 DIAGNOSIS — F17210 Nicotine dependence, cigarettes, uncomplicated: Secondary | ICD-10-CM | POA: Diagnosis not present

## 2018-01-06 DIAGNOSIS — Z79899 Other long term (current) drug therapy: Secondary | ICD-10-CM | POA: Insufficient documentation

## 2018-01-06 MED ORDER — RIVAROXABAN 20 MG PO TABS: 20.0000 mg | ORAL_TABLET | Freq: Every day | ORAL | 0 refills | Status: AC

## 2018-01-06 NOTE — ED Triage Notes (Signed)
Pt says he was diagnosed with blood clot in r leg 3 weeks ago and was put on xarelto.  Pt says he was told to return when he ran out of his xarelto.  Pt also requesting medicine for pain.

## 2018-01-06 NOTE — Discharge Instructions (Addendum)
Today I have refilled your Xarelto.  Please follow-up with your primary care provider to discuss further refills of your Xarelto, your pain in your leg, and other possible treatments.  If you have any trauma including striking your head while taking Xarelto you need to go immediately to the emergency room even if you feel okay as Xarelto increases your chance of bleeding.  While in the emergency room your blood pressure was high, you need to follow up with your doctor about your blood pressure.      Please take Tylenol (acetaminophen) to relieve your pain.  You may take tylenol, up to 1,000 mg (two extra strength pills).  Do not take more than 3,000 mg tylenol in a 24 hour period.  Please check all medication labels as many medications such as pain and cold medications may contain tylenol. Please do not drink alcohol while taking this medication.

## 2018-01-06 NOTE — ED Notes (Signed)
Pt on phone 

## 2018-01-06 NOTE — ED Provider Notes (Signed)
[facility] EMERGENCY DEPARTMENT Provider Note   CSN: [id number] Arrival date & time: 01/06/18  9935     History   Chief Complaint Chief Complaint  Patient presents with  . Medication Refill    HPI Timothy KAUPP is a 57 y.o. male with a past medical history of COPD, HTN, Laryngeal cancer, who presents today for refill on his xarelto.  He was diagnosed with a DVT in his right lower leg approximately 1 month ago.  He reports compliance with his Xarelto, he has not missed any doses.  He is here for a refill.  He reports mild pain in his right leg.  He does not voice any chest pain, shortness of breath or other complaints today.         HPI  Past Medical History:  Diagnosis Date  . Anxiety   . Cancer ([place])    laryngeal  . COPD (chronic obstructive pulmonary disease) ([place])    pt states he was told he has COPD by health department  . Hypertension   . 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 Laryngeal cancer ([place]) 09/29/2017  . Goals of care, counseling/discussion 09/29/2017    Past Surgical History:  Procedure Laterality Date  . BLADDER SURGERY    . MICROLARYNGOSCOPY N/A 09/20/2017   Procedure: MICRO LARYNGOSCOPY WITH BIOPSY OF LARYNGEAL MASS;  Surgeon: Leta Baptist, MD;  Location: [facility];  Service: ENT;  Laterality: N/A;  . PORTACATH PLACEMENT Left 10/21/2017   Procedure: INSERTION PORT-A-CATH;  Surgeon: Virl Cagey, MD;  Location: [facility];  Service: General;  Laterality: Left;        Home Medications    Prior to Admission medications   Medication Sig Start Date End Date Taking? Authorizing Provider  alum & mag hydroxide-simeth (MAALOX/MYLANTA) 200-200-20 MG/5ML suspension Take 30 mLs by mouth as needed for indigestion or heartburn.    [provider]  amLODipine (NORVASC) 10 MG tablet Take 10 mg by mouth at bedtime.     [provider]  docusate sodium (COLACE) 100 MG capsule  Take 1 capsule (100 mg total) by mouth 2 (two) times daily as needed for mild constipation. Patient not taking: Reported on 12/05/2017 10/21/17 10/21/18  Virl Cagey, MD  doxylamine, Sleep, (UNISOM) 25 MG tablet Take 25 mg by mouth at bedtime as needed for sleep.    [provider]  fluconazole (DIFLUCAN) 100 MG tablet Take 2 tablets today then 1 tablet daily for 6 additional days orally 11/28/17   [provider]  FLUoxetine (PROZAC) 20 MG tablet Take 60 mg by mouth daily.     [provider]  gabapentin (NEURONTIN) 300 MG capsule Take 1 capsule (300 mg total) by mouth 3 (three) times daily. 11/29/17   Holley Bouche, NP  loratadine (CLARITIN) 10 MG tablet Take 10 mg by mouth daily as needed for allergies.    [provider]  lovastatin (MEVACOR) 20 MG tablet Take 20 mg by mouth at bedtime.    [provider]  oxyCODONE (ROXICODONE) 5 MG immediate release tablet Take 1 tablet (5 mg total) by mouth every 4 (four) hours as needed for severe pain or breakthrough pain. 10/21/17 10/21/18  Virl Cagey, MD  oxyCODONE (ROXICODONE) 5 MG immediate release tablet Take 1-2 tablets every 4-6 hours as needed for pain 11/28/17   [provider]  rivaroxaban (XARELTO) 20 MG TABS tablet Take 1 tablet (  20 mg total) by mouth daily with supper. 01/06/18 02/05/18  Lorin Glass, PA-C  sodium fluoride (PREVIDENT 5000 PLUS) 1.1 % CREA dental cream Apply cream to tooth brush. Brush teeth for 2 minutes. Spit out excess. Repeat nightly. 10/06/17   Lenn Cal, DDS  prochlorperazine (COMPAZINE) 10 MG tablet Take 1 tablet (10 mg total) by mouth every 6 (six) hours as needed (Nausea or vomiting). 10/17/17 12/06/17  Derek Jack, MD    Family History Family History  Problem Relation Age of Onset  . Bladder Cancer Mother        77  . Colon cancer Father   . Thyroid disease Sister   . Heart disease Brother        stent placement  . Heart disease  Maternal Grandmother   . Diabetes Maternal Grandfather   . Cancer Paternal Grandmother   . AAA (abdominal aortic aneurysm) Paternal Grandfather     Social History Social History   Tobacco Use  . Smoking status: Current Some Day Smoker    Packs/day: 0.00    Types: Cigarettes  . Smokeless tobacco: Current User    Types: Snuff  . Tobacco comment: 2 cigarettes with coffee in the morning  Substance Use Topics  . Alcohol use: No    Frequency: Never    Comment: in recovery x since 2013  . Drug use: No     Allergies   Tetracyclines & related   Review of Systems Review of Systems  Respiratory: Negative for shortness of breath.   Cardiovascular: Negative for chest pain.  Musculoskeletal:       Pain in right lower leg     Physical Exam Updated Vital Signs BP (!) 155/132 (BP Location: Right Arm)   Pulse 91   Temp 98.2 F (36.8 C) (Oral)   Resp 18   Ht 5\' 7"  (1.702 m)   Wt 81.6 kg (180 lb)   SpO2 99%   BMI 28.19 kg/m   Physical Exam  Constitutional: He appears well-developed and well-nourished.  Cardiovascular: Normal rate and intact distal pulses.  Right leg 2+ DP/PT pulses.  Right foot is warm and well perfused.  Musculoskeletal:  5/5 strength right ankle dorsiflexion and plantar flexion.  No obvious edema, redness, or palpable cords in the right lower extremity.  Neurological: He is alert.  Sensation intact to right foot.  Skin: Skin is warm. He is not diaphoretic.  There is slight skin peeling on the right ankle where patient reports use of BenGay cream.  No abnormal erythema, induration, or fluctuance.  Nursing note and vitals reviewed.    ED Treatments / Results  Labs (all labs ordered are listed, but only abnormal results are displayed) Labs Reviewed - No data to display  EKG None  Radiology No results found.  Procedures Procedures (including critical care time)  Medications Ordered in ED Medications - No data to display   Initial Impression  / Assessment and Plan / ED Course  I have reviewed the triage vital signs and the nursing notes.  Pertinent labs & imaging results that were available during my care of the patient were reviewed by me and considered in my medical decision making (see chart for details).  Clinical Course as of Jan 06 854  Fri Jan 06, 2018  0845 BP 144/114 HR 92 Patient reports he has drank 5 cups of coffee today along with eating sausage, and other high sodium foods today.   [EH]    Clinical Course User Index [  EH] Lorin Glass, PA-C   Patient presents today for evaluation of right leg pain in the setting of known DVT and requesting Xarelto refill.  He has not missed any doses.  He was noted to be hypertensive and mildly tachycardic on arrival, repeat vitals improved.  He did not voice any chest pain or shortness of breath, reports compliance with his Xarelto.  He was advised to follow-up with his primary care provider for further Xarelto refills and DVT management.  He was also advised that he may take Tylenol, as long as he is not drinking, to help with his pain.  Anticoagulation precautions discussed.  Patient has card for free 30-day supply of Xarelto.  Patient discharged home.   Final Clinical Impressions(s) / ED Diagnoses   Final diagnoses:  Medication refill  Right leg pain  Deep vein thrombosis (DVT) of right lower extremity, unspecified chronicity, unspecified vein (HCC)  Hypertension, unspecified type    ED Discharge Orders        Ordered    rivaroxaban (XARELTO) 20 MG TABS tablet  Daily with supper     01/06/18 0850       Lorin Glass, PA-C 01/06/18 2197    Davonna Belling, MD 01/06/18 [phone]

## 2018-01-17 ENCOUNTER — Other Ambulatory Visit (HOSPITAL_COMMUNITY): Payer: Self-pay | Admitting: Dentistry

## 2018-02-01 ENCOUNTER — Encounter (HOSPITAL_COMMUNITY): Payer: Self-pay | Admitting: Dentistry

## 2018-02-01 ENCOUNTER — Ambulatory Visit (HOSPITAL_COMMUNITY): Payer: Medicaid - Dental | Admitting: Dentistry

## 2018-02-01 VITALS — BP 125/81 | HR 82 | Temp 98.4°F | Wt 180.0 lb

## 2018-02-01 DIAGNOSIS — K0850 Unsatisfactory restoration of tooth, unspecified: Secondary | ICD-10-CM

## 2018-02-01 DIAGNOSIS — R432 Parageusia: Secondary | ICD-10-CM

## 2018-02-01 DIAGNOSIS — K117 Disturbances of salivary secretion: Secondary | ICD-10-CM

## 2018-02-01 DIAGNOSIS — R682 Dry mouth, unspecified: Secondary | ICD-10-CM

## 2018-02-01 DIAGNOSIS — K029 Dental caries, unspecified: Secondary | ICD-10-CM

## 2018-02-01 DIAGNOSIS — Z9189 Other specified personal risk factors, not elsewhere classified: Secondary | ICD-10-CM

## 2018-02-01 DIAGNOSIS — Z923 Personal history of irradiation: Secondary | ICD-10-CM

## 2018-02-01 DIAGNOSIS — Z9221 Personal history of antineoplastic chemotherapy: Secondary | ICD-10-CM

## 2018-02-01 DIAGNOSIS — C329 Malignant neoplasm of larynx, unspecified: Secondary | ICD-10-CM

## 2018-02-01 NOTE — Patient Instructions (Signed)
RECOMMENDATIONS: 1. Brush after meals and at bedtime.  Use fluoride at bedtime. 2. Use trismus exercises as directed. 3. Use Biotene Rinse or salt water/baking soda rinses. 4. Multiple sips of water as needed. 5. Return to Dr. Mina Marble for routine periodontal maintenance procedures in 1-2 months and evaluation of dental caries and previous dental restorations on tooth number 10 on the distal and #27 on the facial and mesiofacial.  Lenn Cal, DDS

## 2018-02-01 NOTE — Progress Notes (Signed)
02/01/2018  Patient Name:   Timothy Parks Date of Birth:   [birth year] Medical Record Number: [id number]  BP 125/81 (BP Location: Left Arm)   Pulse 82   Temp 98.4 F (36.9 C)   Wt 180 lb (81.6 kg)   BMI 28.19 kg/m   [name] presents for oral examination after chemoradiation therapy. Patient has completed all radiation treatments from 10/26/2017 through 12/19/2017 with Dr. Winfred Burn. 7 weekly chemotherapy treatments were given under the care of Dr.Katragadda  REVIEW OF CHIEF COMPLAINTS:  DRY MOUTH: Patient denies. HARD TO SWALLOW: no  HURT TO SWALlOW: no TASTE CHANGES:   Taste is slowly returning. SORES IN MOUTH: Patient denies having sores in his mouth. TRISMUS: no trismus symptoms WEIGHT: 180 pounds down from an initial 212 pounds  HOME OH REGIMEN:  BRUSHING: once a day - Patient was encouraged to brush after meals and at bedtime to avoid significant caries. FLOSSING: once a day RINSING: uses regular Listerine and Biotene rinses FLUORIDE: Uses PreviDent in the morning.  He was encouraged to use PreviDent 5000 fluoride toothpaste at bedtime since that is the time the mouth is getting most dry and puts the patient at risk for radiation caries. TRISMUS EXERCISES:  Maximum interincisal opening: 55 mm   DENTAL EXAM:  Oral Hygiene:(PLAQUE): Plaque noted. LOCATION OF MUCOSITIS:  None noted DESCRIPTION OF SALIVA: Decreased and foamysaliva. Moderate xerostomia. ANY EXPOSED BONE: none noted OTHER WATCHED AREAS: Previous dental caries and suboptimal restorations as charted. Patient to follow-up with his primary dentist for dental restorations and evaluation for periodontal maintenance procedures with regular recall exams every 4-6 months. DX: Xerostomia, Dysgeusia, Accretions and dental caries and suboptimal restorations  RECOMMENDATIONS: 1. Brush after meals and at bedtime.  Use fluoride at bedtime. 2. Use trismus exercises as directed. 3. Use  Biotene Rinse or salt water/baking soda rinses. 4. Multiple sips of water as needed. 5. Return to Dr. Mina Marble for routine periodontal maintenance procedures in 1-2 months and evaluation of dental caries and previous dental restorations on tooth number 10 on the distal and #27 on the facial and mesiofacial.  Lenn Cal, DDS

## 2018-02-02 ENCOUNTER — Ambulatory Visit (INDEPENDENT_AMBULATORY_CARE_PROVIDER_SITE_OTHER): Payer: Medicaid Other | Admitting: Otolaryngology

## 2018-02-02 DIAGNOSIS — Z85818 Personal history of malignant neoplasm of other sites of lip, oral cavity, and pharynx: Secondary | ICD-10-CM

## 2018-02-09 ENCOUNTER — Encounter (HOSPITAL_COMMUNITY): Payer: Self-pay | Admitting: Dentistry

## 2018-02-15 ENCOUNTER — Other Ambulatory Visit (HOSPITAL_COMMUNITY): Payer: Self-pay | Admitting: Interventional Radiology

## 2018-02-15 ENCOUNTER — Other Ambulatory Visit (HOSPITAL_COMMUNITY): Payer: Self-pay | Admitting: Radiation Oncology

## 2018-02-15 DIAGNOSIS — C329 Malignant neoplasm of larynx, unspecified: Secondary | ICD-10-CM

## 2018-03-03 ENCOUNTER — Other Ambulatory Visit (HOSPITAL_COMMUNITY): Payer: Self-pay | Admitting: Radiation Oncology

## 2018-03-03 DIAGNOSIS — C329 Malignant neoplasm of larynx, unspecified: Secondary | ICD-10-CM

## 2018-03-06 ENCOUNTER — Other Ambulatory Visit (HOSPITAL_COMMUNITY): Payer: Self-pay

## 2018-03-06 ENCOUNTER — Encounter (HOSPITAL_COMMUNITY): Payer: Self-pay

## 2018-03-06 ENCOUNTER — Encounter (HOSPITAL_COMMUNITY): Payer: Medicaid Other

## 2018-03-15 ENCOUNTER — Ambulatory Visit (HOSPITAL_COMMUNITY): Payer: Self-pay

## 2018-04-03 ENCOUNTER — Encounter (HOSPITAL_COMMUNITY): Payer: Medicaid Other

## 2018-05-25 ENCOUNTER — Encounter (HOSPITAL_COMMUNITY): Payer: Self-pay | Admitting: Emergency Medicine

## 2018-05-25 ENCOUNTER — Inpatient Hospital Stay (HOSPITAL_COMMUNITY)
Admission: EM | Admit: 2018-05-25 | Discharge: 2018-06-18 | DRG: 871 | Disposition: E | Payer: Medicaid Other | Attending: Internal Medicine | Admitting: Internal Medicine

## 2018-05-25 ENCOUNTER — Other Ambulatory Visit: Payer: Self-pay

## 2018-05-25 ENCOUNTER — Emergency Department (HOSPITAL_COMMUNITY): Payer: Medicaid Other

## 2018-05-25 DIAGNOSIS — Z8349 Family history of other endocrine, nutritional and metabolic diseases: Secondary | ICD-10-CM

## 2018-05-25 DIAGNOSIS — Z923 Personal history of irradiation: Secondary | ICD-10-CM

## 2018-05-25 DIAGNOSIS — R0902 Hypoxemia: Secondary | ICD-10-CM

## 2018-05-25 DIAGNOSIS — D649 Anemia, unspecified: Secondary | ICD-10-CM | POA: Diagnosis not present

## 2018-05-25 DIAGNOSIS — C787 Secondary malignant neoplasm of liver and intrahepatic bile duct: Secondary | ICD-10-CM | POA: Diagnosis present

## 2018-05-25 DIAGNOSIS — Z515 Encounter for palliative care: Secondary | ICD-10-CM

## 2018-05-25 DIAGNOSIS — R197 Diarrhea, unspecified: Secondary | ICD-10-CM | POA: Diagnosis not present

## 2018-05-25 DIAGNOSIS — R531 Weakness: Secondary | ICD-10-CM

## 2018-05-25 DIAGNOSIS — F17210 Nicotine dependence, cigarettes, uncomplicated: Secondary | ICD-10-CM | POA: Diagnosis present

## 2018-05-25 DIAGNOSIS — Z881 Allergy status to other antibiotic agents status: Secondary | ICD-10-CM

## 2018-05-25 DIAGNOSIS — A419 Sepsis, unspecified organism: Secondary | ICD-10-CM | POA: Diagnosis present

## 2018-05-25 DIAGNOSIS — N179 Acute kidney failure, unspecified: Secondary | ICD-10-CM | POA: Diagnosis present

## 2018-05-25 DIAGNOSIS — Z66 Do not resuscitate: Secondary | ICD-10-CM | POA: Diagnosis present

## 2018-05-25 DIAGNOSIS — C7972 Secondary malignant neoplasm of left adrenal gland: Secondary | ICD-10-CM | POA: Diagnosis present

## 2018-05-25 DIAGNOSIS — F419 Anxiety disorder, unspecified: Secondary | ICD-10-CM | POA: Diagnosis present

## 2018-05-25 DIAGNOSIS — R1310 Dysphagia, unspecified: Secondary | ICD-10-CM

## 2018-05-25 DIAGNOSIS — J4489 Other specified chronic obstructive pulmonary disease: Secondary | ICD-10-CM

## 2018-05-25 DIAGNOSIS — C799 Secondary malignant neoplasm of unspecified site: Secondary | ICD-10-CM | POA: Diagnosis present

## 2018-05-25 DIAGNOSIS — E43 Unspecified severe protein-calorie malnutrition: Secondary | ICD-10-CM | POA: Diagnosis present

## 2018-05-25 DIAGNOSIS — Z9189 Other specified personal risk factors, not elsewhere classified: Secondary | ICD-10-CM | POA: Diagnosis not present

## 2018-05-25 DIAGNOSIS — C7951 Secondary malignant neoplasm of bone: Secondary | ICD-10-CM | POA: Diagnosis not present

## 2018-05-25 DIAGNOSIS — J690 Pneumonitis due to inhalation of food and vomit: Secondary | ICD-10-CM | POA: Diagnosis present

## 2018-05-25 DIAGNOSIS — E872 Acidosis, unspecified: Secondary | ICD-10-CM

## 2018-05-25 DIAGNOSIS — F1021 Alcohol dependence, in remission: Secondary | ICD-10-CM | POA: Diagnosis present

## 2018-05-25 DIAGNOSIS — E785 Hyperlipidemia, unspecified: Secondary | ICD-10-CM | POA: Diagnosis present

## 2018-05-25 DIAGNOSIS — Z7901 Long term (current) use of anticoagulants: Secondary | ICD-10-CM

## 2018-05-25 DIAGNOSIS — N183 Chronic kidney disease, stage 3 unspecified: Secondary | ICD-10-CM

## 2018-05-25 DIAGNOSIS — J449 Chronic obstructive pulmonary disease, unspecified: Secondary | ICD-10-CM

## 2018-05-25 DIAGNOSIS — Z8052 Family history of malignant neoplasm of bladder: Secondary | ICD-10-CM

## 2018-05-25 DIAGNOSIS — C329 Malignant neoplasm of larynx, unspecified: Secondary | ICD-10-CM | POA: Diagnosis present

## 2018-05-25 DIAGNOSIS — Y842 Radiological procedure and radiotherapy as the cause of abnormal reaction of the patient, or of later complication, without mention of misadventure at the time of the procedure: Secondary | ICD-10-CM | POA: Diagnosis present

## 2018-05-25 DIAGNOSIS — L8990 Pressure ulcer of unspecified site, unspecified stage: Secondary | ICD-10-CM | POA: Diagnosis present

## 2018-05-25 DIAGNOSIS — R778 Other specified abnormalities of plasma proteins: Secondary | ICD-10-CM

## 2018-05-25 DIAGNOSIS — C779 Secondary and unspecified malignant neoplasm of lymph node, unspecified: Secondary | ICD-10-CM | POA: Diagnosis present

## 2018-05-25 DIAGNOSIS — Z7401 Bed confinement status: Secondary | ICD-10-CM

## 2018-05-25 DIAGNOSIS — Z7189 Other specified counseling: Secondary | ICD-10-CM | POA: Diagnosis not present

## 2018-05-25 DIAGNOSIS — D631 Anemia in chronic kidney disease: Secondary | ICD-10-CM | POA: Diagnosis present

## 2018-05-25 DIAGNOSIS — R7989 Other specified abnormal findings of blood chemistry: Secondary | ICD-10-CM

## 2018-05-25 DIAGNOSIS — C786 Secondary malignant neoplasm of retroperitoneum and peritoneum: Secondary | ICD-10-CM | POA: Diagnosis present

## 2018-05-25 DIAGNOSIS — I82401 Acute embolism and thrombosis of unspecified deep veins of right lower extremity: Secondary | ICD-10-CM | POA: Diagnosis present

## 2018-05-25 DIAGNOSIS — B3781 Candidal esophagitis: Secondary | ICD-10-CM

## 2018-05-25 DIAGNOSIS — Z8249 Family history of ischemic heart disease and other diseases of the circulatory system: Secondary | ICD-10-CM

## 2018-05-25 DIAGNOSIS — E871 Hypo-osmolality and hyponatremia: Secondary | ICD-10-CM | POA: Diagnosis present

## 2018-05-25 DIAGNOSIS — D509 Iron deficiency anemia, unspecified: Secondary | ICD-10-CM | POA: Diagnosis present

## 2018-05-25 DIAGNOSIS — R609 Edema, unspecified: Secondary | ICD-10-CM

## 2018-05-25 DIAGNOSIS — E860 Dehydration: Secondary | ICD-10-CM | POA: Diagnosis present

## 2018-05-25 DIAGNOSIS — Z8521 Personal history of malignant neoplasm of larynx: Secondary | ICD-10-CM

## 2018-05-25 DIAGNOSIS — I129 Hypertensive chronic kidney disease with stage 1 through stage 4 chronic kidney disease, or unspecified chronic kidney disease: Secondary | ICD-10-CM | POA: Diagnosis present

## 2018-05-25 DIAGNOSIS — R05 Cough: Secondary | ICD-10-CM

## 2018-05-25 DIAGNOSIS — Z833 Family history of diabetes mellitus: Secondary | ICD-10-CM

## 2018-05-25 DIAGNOSIS — J9601 Acute respiratory failure with hypoxia: Secondary | ICD-10-CM | POA: Diagnosis present

## 2018-05-25 DIAGNOSIS — Z86718 Personal history of other venous thrombosis and embolism: Secondary | ICD-10-CM

## 2018-05-25 DIAGNOSIS — Z800 Family history of malignant neoplasm of digestive organs: Secondary | ICD-10-CM

## 2018-05-25 DIAGNOSIS — R059 Cough, unspecified: Secondary | ICD-10-CM

## 2018-05-25 DIAGNOSIS — R1312 Dysphagia, oropharyngeal phase: Secondary | ICD-10-CM | POA: Diagnosis present

## 2018-05-25 DIAGNOSIS — Z9221 Personal history of antineoplastic chemotherapy: Secondary | ICD-10-CM

## 2018-05-25 DIAGNOSIS — Z79899 Other long term (current) drug therapy: Secondary | ICD-10-CM

## 2018-05-25 LAB — CBC WITH DIFFERENTIAL/PLATELET
ABS IMMATURE GRANULOCYTES: 1.62 10*3/uL — AB (ref 0.00–0.07)
BASOS ABS: 0.1 10*3/uL (ref 0.0–0.1)
Basophils Relative: 0 %
EOS ABS: 0.1 10*3/uL (ref 0.0–0.5)
EOS PCT: 0 %
HCT: 25.4 % — ABNORMAL LOW (ref 39.0–52.0)
HEMOGLOBIN: 8.1 g/dL — AB (ref 13.0–17.0)
Immature Granulocytes: 6 %
LYMPHS ABS: 1.3 10*3/uL (ref 0.7–4.0)
LYMPHS PCT: 5 %
MCH: 27.5 pg (ref 26.0–34.0)
MCHC: 31.9 g/dL (ref 30.0–36.0)
MCV: 86.1 fL (ref 80.0–100.0)
MONO ABS: 0.4 10*3/uL (ref 0.1–1.0)
MONOS PCT: 1 %
Neutro Abs: 25.1 10*3/uL — ABNORMAL HIGH (ref 1.7–7.7)
Neutrophils Relative %: 88 %
Platelets: 522 10*3/uL — ABNORMAL HIGH (ref 150–400)
RBC: 2.95 MIL/uL — ABNORMAL LOW (ref 4.22–5.81)
RDW: 14.6 % (ref 11.5–15.5)
WBC: 28.5 10*3/uL — ABNORMAL HIGH (ref 4.0–10.5)
nRBC: 0.1 % (ref 0.0–0.2)

## 2018-05-25 LAB — COMPREHENSIVE METABOLIC PANEL
ALT: 24 U/L (ref 0–44)
AST: 57 U/L — AB (ref 15–41)
Albumin: 2.1 g/dL — ABNORMAL LOW (ref 3.5–5.0)
Alkaline Phosphatase: 151 U/L — ABNORMAL HIGH (ref 38–126)
Anion gap: 15 (ref 5–15)
BILIRUBIN TOTAL: 1 mg/dL (ref 0.3–1.2)
BUN: 59 mg/dL — AB (ref 6–20)
CALCIUM: 8.1 mg/dL — AB (ref 8.9–10.3)
CHLORIDE: 85 mmol/L — AB (ref 98–111)
CO2: 23 mmol/L (ref 22–32)
CREATININE: 1.4 mg/dL — AB (ref 0.61–1.24)
GFR, EST NON AFRICAN AMERICAN: 54 mL/min — AB (ref >60)
Glucose, Bld: 103 mg/dL — ABNORMAL HIGH (ref 70–99)
Potassium: 3.9 mmol/L (ref 3.5–5.1)
Sodium: 123 mmol/L — ABNORMAL LOW (ref 135–145)
TOTAL PROTEIN: 6.8 g/dL (ref 6.5–8.1)

## 2018-05-25 LAB — URINALYSIS, ROUTINE W REFLEX MICROSCOPIC
BILIRUBIN URINE: NEGATIVE
Glucose, UA: NEGATIVE mg/dL
HGB URINE DIPSTICK: NEGATIVE
Ketones, ur: NEGATIVE mg/dL
Leukocytes, UA: NEGATIVE
Nitrite: NEGATIVE
PROTEIN: NEGATIVE mg/dL
Specific Gravity, Urine: 1.016 (ref 1.005–1.030)
pH: 5 (ref 5.0–8.0)

## 2018-05-25 LAB — I-STAT CG4 LACTIC ACID, ED: LACTIC ACID, VENOUS: 2.68 mmol/L — AB (ref 0.5–1.9)

## 2018-05-25 LAB — BRAIN NATRIURETIC PEPTIDE: B NATRIURETIC PEPTIDE 5: 142 pg/mL — AB (ref 0.0–100.0)

## 2018-05-25 LAB — LACTIC ACID, PLASMA: LACTIC ACID, VENOUS: 1.8 mmol/L (ref 0.5–1.9)

## 2018-05-25 LAB — LIPASE, BLOOD: LIPASE: 26 U/L (ref 11–51)

## 2018-05-25 LAB — TROPONIN I: Troponin I: 0.04 ng/mL (ref <0.03)

## 2018-05-25 MED ORDER — ENOXAPARIN SODIUM 80 MG/0.8ML ~~LOC~~ SOLN
1.0000 mg/kg | Freq: Two times a day (BID) | SUBCUTANEOUS | Status: DC
  Administered 2018-05-26 – 2018-05-28 (×5): 70 mg via SUBCUTANEOUS
  Filled 2018-05-25 (×5): qty 0.8

## 2018-05-25 MED ORDER — PIPERACILLIN-TAZOBACTAM 3.375 G IVPB 30 MIN
3.3750 g | Freq: Once | INTRAVENOUS | Status: AC
  Administered 2018-05-25: 3.375 g via INTRAVENOUS
  Filled 2018-05-25: qty 50

## 2018-05-25 MED ORDER — METRONIDAZOLE IN NACL 5-0.79 MG/ML-% IV SOLN
500.0000 mg | Freq: Three times a day (TID) | INTRAVENOUS | Status: DC
  Administered 2018-05-25 – 2018-06-01 (×20): 500 mg via INTRAVENOUS
  Filled 2018-05-25 (×20): qty 100

## 2018-05-25 MED ORDER — MOMETASONE FURO-FORMOTEROL FUM 200-5 MCG/ACT IN AERO
2.0000 | INHALATION_SPRAY | Freq: Two times a day (BID) | RESPIRATORY_TRACT | Status: DC
  Administered 2018-05-26 – 2018-06-09 (×23): 2 via RESPIRATORY_TRACT
  Filled 2018-05-25: qty 8.8

## 2018-05-25 MED ORDER — POLYETHYLENE GLYCOL 3350 17 G PO PACK
17.0000 g | PACK | Freq: Every day | ORAL | Status: DC
  Administered 2018-05-28 – 2018-06-07 (×10): 17 g via ORAL
  Filled 2018-05-25 (×13): qty 1

## 2018-05-25 MED ORDER — SODIUM CHLORIDE 0.9 % IV BOLUS
1000.0000 mL | Freq: Once | INTRAVENOUS | Status: AC
  Administered 2018-05-25: 1000 mL via INTRAVENOUS

## 2018-05-25 MED ORDER — ONDANSETRON HCL 4 MG PO TABS: 4.0000 mg | ORAL_TABLET | Freq: Four times a day (QID) | ORAL | Status: DC | PRN

## 2018-05-25 MED ORDER — IPRATROPIUM-ALBUTEROL 0.5-2.5 (3) MG/3ML IN SOLN
3.0000 mL | Freq: Four times a day (QID) | RESPIRATORY_TRACT | Status: DC
  Administered 2018-05-25: 3 mL via RESPIRATORY_TRACT
  Filled 2018-05-25: qty 3

## 2018-05-25 MED ORDER — MORPHINE SULFATE (PF) 4 MG/ML IV SOLN
4.0000 mg | INTRAVENOUS | Status: DC | PRN
  Administered 2018-05-25 – 2018-06-04 (×24): 4 mg via INTRAVENOUS
  Filled 2018-05-25 (×26): qty 1

## 2018-05-25 MED ORDER — GABAPENTIN 300 MG PO CAPS
300.0000 mg | ORAL_CAPSULE | Freq: Three times a day (TID) | ORAL | Status: DC
  Administered 2018-05-25 – 2018-06-08 (×41): 300 mg via ORAL
  Filled 2018-05-25 (×43): qty 1

## 2018-05-25 MED ORDER — IPRATROPIUM-ALBUTEROL 0.5-2.5 (3) MG/3ML IN SOLN
3.0000 mL | Freq: Three times a day (TID) | RESPIRATORY_TRACT | Status: DC
  Administered 2018-05-26 – 2018-06-10 (×44): 3 mL via RESPIRATORY_TRACT
  Filled 2018-05-25 (×44): qty 3

## 2018-05-25 MED ORDER — VANCOMYCIN HCL IN DEXTROSE 1-5 GM/200ML-% IV SOLN
1000.0000 mg | Freq: Once | INTRAVENOUS | Status: AC
  Administered 2018-05-25: 1000 mg via INTRAVENOUS
  Filled 2018-05-25: qty 200

## 2018-05-25 MED ORDER — SODIUM CHLORIDE 0.9 % IV SOLN
2.0000 g | Freq: Two times a day (BID) | INTRAVENOUS | Status: DC
  Administered 2018-05-26 (×3): 2 g via INTRAVENOUS
  Filled 2018-05-25 (×9): qty 2

## 2018-05-25 MED ORDER — SODIUM CHLORIDE 0.9 % IV SOLN
INTRAVENOUS | Status: DC
  Administered 2018-05-25 – 2018-05-27 (×3): via INTRAVENOUS

## 2018-05-25 MED ORDER — ONDANSETRON HCL 4 MG/2ML IJ SOLN
4.0000 mg | Freq: Four times a day (QID) | INTRAMUSCULAR | Status: DC | PRN
  Administered 2018-05-28 – 2018-06-05 (×2): 4 mg via INTRAVENOUS
  Filled 2018-05-25 (×2): qty 2

## 2018-05-25 MED ORDER — IOPAMIDOL (ISOVUE-300) INJECTION 61%
100.0000 mL | Freq: Once | INTRAVENOUS | Status: AC | PRN
  Administered 2018-05-25: 100 mL via INTRAVENOUS

## 2018-05-25 MED ORDER — TRAZODONE HCL 50 MG PO TABS
50.0000 mg | ORAL_TABLET | Freq: Every evening | ORAL | Status: DC | PRN
  Administered 2018-05-25 – 2018-05-26 (×2): 50 mg via ORAL
  Filled 2018-05-25 (×2): qty 1

## 2018-05-25 NOTE — H&P (Signed)
History and Physical    JEFFERSON FULLAM EPP:[id number] DOB: 01/24/1961 DOA: 05/20/2018  PCP: Cory Munch, PA-C Patient coming from: home.  Lives alone.  Chief Complaint: Diarrhea and weakness  HPI: Timothy Parks is a 57 y.o. 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 Patient reports watery bowel movement for the last 3 weeks.  Denies hematochezia or melena.  Reports some bowel accidents.  Had one episode of watery diarrhea yesterday.  He has not had bowel movement this morning.  He also reports diffuse abdominal pain with deep breathing and cough.  He reports cough with yellowish to greenish phlegm.  He has history of COPD.  He has over 60-pack-year smoking history and cut down to 2 cigarettes a day 5 years ago.  He also reports drinking a lot of water about 4-5, 16 ounce bottles. He denies nausea or vomiting.  Denies fever, chest pain or shortness of breath, dysuria but admits to chills and hot flashes.  He thinks he has lost a lot of weight.  He denies drinking alcohol or recreational drug use.  In ED, hypotensive to 87/51 on arrival.  Tachycardic to 100's.  CBC with leukocytosis to 29 with bandemia.  Hemoglobin 8.1 (baseline 8-9).  Lactic acid 2.68.  Sodium 123 (baseline about 130)..  Creatinine 1.4 (baseline).  BNP 142.  Troponin 0 0.04.  EKG was not obtained.  Urinalysis not impressive.  CT abdomen and pelvis showed multiple new mets to liver, retroperitoneal lymph nodes, and upper abdominal lymph nodes, peritoneum and ribs.  Patient was resuscitated with IV normal saline bolus 1 L.  Lactic acidosis resolved.  Blood cultures obtained.  Started on vancomycin and Zosyn.  Hospitalist service was called to admit patient.  ROS Review of Systems  Constitutional: Positive for chills and weight loss.  Negative for fever.  HENT: Negative for congestion and sore throat.   Eyes: Negative for blurred vision.  Respiratory: Positive for cough and sputum production. Negative for shortness of breath and wheezing.   Cardiovascular: Negative for chest pain, palpitations, orthopnea and leg swelling.  Gastrointestinal: Positive for abdominal pain and diarrhea. Negative for blood in stool, melena and vomiting.  Genitourinary: Negative for dysuria.  Musculoskeletal: Negative for myalgias.  Skin: Negative for rash.  Neurological: Negative for speech change, focal weakness, weakness and headaches.  Endo/Heme/Allergies: Does not bruise/bleed easily.  Psychiatric/Behavioral: Negative for substance abuse. The patient is not nervous/anxious.     PMH Past Medical History:  Diagnosis Date  . Anxiety   . Cancer ([place])    laryngeal  . COPD (chronic obstructive pulmonary disease) ([facility])    pt states he was told he has COPD by health department  . Hypertension   . Recovering alcoholic ([place])    [place] Past Surgical History:  Procedure Laterality Date  . BLADDER SURGERY    . MICROLARYNGOSCOPY N/A 09/20/2017   Procedure: MICRO LARYNGOSCOPY WITH BIOPSY OF LARYNGEAL MASS;  Surgeon: Leta Baptist, MD;  Location: [facility];  Service: ENT;  Laterality: N/A;  . PORTACATH PLACEMENT Left 10/21/2017   Procedure: INSERTION PORT-A-CATH;  Surgeon: Virl Cagey, MD;  Location: [facility];  Service: General;  Laterality: Left;   Fam HX Family History  Problem Relation Age of Onset  . Bladder Cancer Mother        44  . Colon cancer  Father   . Thyroid disease Sister   . Heart disease Brother        stent placement  . Heart disease Maternal Grandmother   . Diabetes Maternal Grandfather   . Cancer Paternal Grandmother   . AAA (abdominal aortic aneurysm) Paternal Grandfather    Social Hx  reports that he has been smoking cigarettes. He has been smoking about 0.00 packs per day. His smokeless tobacco use  includes snuff. He reports that he does not drink alcohol or use drugs.  Allergy Allergies  Allergen Reactions  . Tetracyclines & Related Anxiety   Home Meds Prior to Admission medications   Medication Sig Start Date End Date Taking? Authorizing Provider  acetaminophen (TYLENOL) 500 MG tablet Take 1,000 mg by mouth every 6 (six) hours as needed.   Yes [provider]  alum & mag hydroxide-simeth (MAALOX/MYLANTA) 200-200-20 MG/5ML suspension Take 30 mLs by mouth as needed for indigestion or heartburn.   Yes [provider]  amLODipine (NORVASC) 10 MG tablet Take 10 mg by mouth at bedtime.    Yes [provider]  doxylamine, Sleep, (UNISOM) 25 MG tablet Take 25 mg by mouth at bedtime as needed for sleep.   Yes [provider]  FLUoxetine (PROZAC) 20 MG tablet Take 60 mg by mouth daily.    Yes [provider]  gabapentin (NEURONTIN) 300 MG capsule Take 1 capsule (300 mg total) by mouth 3 (three) times daily. 11/29/17  Yes Holley Bouche, NP  lidocaine-prilocaine (EMLA) cream Apply 1 application topically as directed. 10/17/17  Yes [provider]  lovastatin (MEVACOR) 20 MG tablet Take 20 mg by mouth at bedtime.   Yes [provider]  ondansetron (ZOFRAN) 8 MG tablet Take 1 tablet by mouth 2 (two) times daily as needed. 12/19/17 12/19/18 Yes [provider]  rivaroxaban (XARELTO) 20 MG TABS tablet Take 1 tablet (20 mg total) by mouth daily with supper. 01/06/18 06/17/2018 Yes Lorin Glass, PA-C  prochlorperazine (COMPAZINE) 10 MG tablet Take 1 tablet (10 mg total) by mouth every 6 (six) hours as needed (Nausea or vomiting). 10/17/17 12/06/17  Derek Jack, MD    Physical Exam: Vitals:   05/31/2018 1800 05/28/2018 1900 06/08/2018 2000 06/01/2018 2101  BP: 114/63 (!) 101/58 103/63 102/61  Pulse: (!) 111 (!) 114 (!) 107 (!) 109  Resp: (!) 22 (!) 22 (!) 23 18  Temp:      TempSrc:      SpO2: 90% 94% 93% 92%  Weight:        Height:        Constitutional: NAD.  Eyes: lids and conjunctivae normal HENMT: atraumatic. Normocephalic. Hearing grossly intact. No rhinorrhea.  Mild oral mucosal dryness.  Poor dentition Neck: normal. Supple. No mass. No thyromegaly or lymphadenopathy Resp: normal effort. Good air movement bilaterally.  No wheezes.  Some crackles bilaterally. CVS: RRR. S1 & S2 heard. No murmurs. No edema. GI: normal bowel sound.  Diffuse tenderness to palpation.  No rebound guarding. MSK: No obvious deformity. No focal tenderness. Moving extremities. Skin: no apparent lesion. Normal warmth.  Neuro: Awake. Alert. Oriented appropriately. CN 2-12 grossly intact. Light sensation intact. Normal strength. DTR symmetric.  Psych: Calm.   Labs on Admission: I have personally reviewed following labs and imaging studies  CBC: Recent Labs  Lab 05/29/2018 1451  WBC 28.5*  NEUTROABS 25.1*  HGB 8.1*  HCT 25.4*  MCV 86.1  PLT 619*   Basic Metabolic Panel: Recent Labs  Lab  06/14/2018 1451  NA 123*  K 3.9  CL 85*  CO2 23  GLUCOSE 103*  BUN 59*  CREATININE 1.40*  CALCIUM 8.1*   GFR: Estimated Creatinine Clearance: 54.4 mL/min (A) (by C-G formula based on SCr of 1.4 mg/dL (H)). Liver Function Tests: Recent Labs  Lab 05/20/2018 1451  AST 57*  ALT 24  ALKPHOS 151*  BILITOT 1.0  PROT 6.8  ALBUMIN 2.1*   Recent Labs  Lab 05/27/2018 1451  LIPASE 26   No results for input(s): AMMONIA in the last 168 hours. Coagulation Profile: No results for input(s): INR, PROTIME in the last 168 hours. Cardiac Enzymes: Recent Labs  Lab 06/16/2018 1451  TROPONINI 0.04*   BNP (last 3 results) No results for input(s): PROBNP in the last 8760 hours. HbA1C: No results for input(s): HGBA1C in the last 72 hours. CBG: No results for input(s): GLUCAP in the last 168 hours. Lipid Profile: No results for input(s): CHOL, HDL, LDLCALC, TRIG, CHOLHDL, LDLDIRECT in the last 72 hours. Thyroid Function Tests: No results  for input(s): TSH, T4TOTAL, FREET4, T3FREE, THYROIDAB in the last 72 hours. Anemia Panel: No results for input(s): VITAMINB12, FOLATE, FERRITIN, TIBC, IRON, RETICCTPCT in the last 72 hours. Urine analysis:    Component Value Date/Time   COLORURINE YELLOW 05/31/2018 1740   APPEARANCEUR CLOUDY (A) 05/27/2018 1740   LABSPEC 1.016 06/14/2018 1740   PHURINE 5.0 05/26/2018 1740   GLUCOSEU NEGATIVE 05/30/2018 1740   HGBUR NEGATIVE 05/22/2018 1740   BILIRUBINUR NEGATIVE 06/09/2018 1740   KETONESUR NEGATIVE 06/08/2018 1740   PROTEINUR NEGATIVE 05/24/2018 1740   NITRITE NEGATIVE 05/29/2018 1740   LEUKOCYTESUR NEGATIVE 05/24/2018 1740    Sepsis Labs:  Lactic acid 2.68 and trended to 1.86. ) Recent Results (from the past 240 hour(s))  Blood Culture (routine x 2)     Status: None (Preliminary result)   Collection Time: 06/09/2018  4:29 PM  Result Value Ref Range Status   Specimen Description BLOOD RIGHT WRIST  Final   Special Requests   Final    BOTTLES DRAWN AEROBIC AND ANAEROBIC Blood Culture adequate volume Performed at [facility], [address] [place], [place] [address]    Culture PENDING  Incomplete   Report Status PENDING  Incomplete     Radiological Exams on Admission: Dg Chest 2 View  Result Date: 05/31/2018 CLINICAL DATA:  Weakness and diarrhea for 3 days, hypotension, history laryngeal cancer, hypertension, COPD, smoker EXAM: CHEST - 2 VIEW COMPARISON:  10/21/2017 FINDINGS: LEFT subclavian Port-A-Cath with tip projecting over SVC. Upper normal heart size. Mediastinal contours and pulmonary vascularity normal. Mild LEFT basilar atelectasis and questionable small subpulmonic pleural effusion. Lungs otherwise clear. No infiltrate, RIGHT pleural effusion or pneumothorax. IMPRESSION: LEFT basilar atelectasis and question small sub pulmonic pleural effusion. Electronically Signed   By: Lavonia Dana M.D.   On: 06/14/2018 16:00   Ct Abdomen Pelvis W Contrast  Result Date:  05/26/2018 CLINICAL DATA:  Cough, abdominal pain EXAM: CT ABDOMEN AND PELVIS WITH CONTRAST TECHNIQUE: Multidetector CT imaging of the abdomen and pelvis was performed using the standard protocol following bolus administration of intravenous contrast. CONTRAST:  130mL ISOVUE-300 IOPAMIDOL (ISOVUE-300) INJECTION 61% COMPARISON:  PET-CT 10/03/2017 FINDINGS: Lower chest: Lung bases demonstrate trace left pleural effusion. Partial atelectasis at the left lung base. Heart size is normal. Hepatobiliary: Development of multiple poorly defined hypodense liver masses consistent with metastatic disease. Caudate lobe lesion measures 3.2 x 2.7 cm. Inferior right hepatic lobe mass lesion measures 3.3 by 2.7  cm. Left hepatic lobe lesion measures 3 x 2.1 cm. Multiple additional liver masses are noted. Pancreas: Unremarkable. No pancreatic ductal dilatation or surrounding inflammatory changes. Spleen: Normal in size without focal abnormality. Adrenals/Urinary Tract: Stable 2 cm left adrenal mass, no change since prior PET-CT. Right adrenal gland is normal. No hydronephrosis. Punctate stones in the kidneys. Bladder normal Stomach/Bowel: Stomach is within normal limits. Appendix appears normal. No evidence of bowel wall thickening, distention, or inflammatory changes. Vascular/Lymphatic: Moderate aortic atherosclerosis. No aneurysm. Age indeterminate occlusion of the right external iliac artery. Reconstitution of flow enhancement within the right common femoral artery. Increased retroperitoneal lymph nodes, measuring up to 6 mm in size. Porta hepatis nodule measuring 1 cm in size. Small subcentimeter nodules anterior to the right colon. Reproductive: Prostate is unremarkable. Other: No free air or free fluid. Generalized haziness and stranding within the mesentery and anterior omentum. Musculoskeletal: 3.8 x 2.1 cm soft tissue mass within the left posterior chest wall, this surrounds the left tenth rib. Expansile left eighth anterior  rib lesion. IMPRESSION: 1. Interim development of multiple poorly defined hypodense liver masses consistent with metastatic disease. 2. Interval increase in retroperitoneal and upper abdominal nodes, also concerning for metastatic disease. Interim finding of hazy density within the mesentery and omentum. This finding in combination with interim finding of small intraperitoneal nodules anterior to the right colon is suspicious for peritoneal metastatic disease. 3. Development of 3.8 cm soft tissue mass surrounding the left tenth rib with additional expansile rib mass in the left eighth rib, concerning for metastatic disease. 4. Trace left pleural effusion with partial atelectasis at the left base 5. Age indeterminate occlusion of the right external iliac artery. Reconstitution of flow at the right common femoral artery. 6. Punctate stones within both kidneys. 7. 2 cm left adrenal mass, no change since 10/03/2017 Electronically Signed   By: Donavan Foil M.D.   On: 06/08/2018 19:12    All images have been reviewed by me personally.   EKG: Not obtained.  Will order one.  Assessment/Plan Principal Problem:   Sepsis ([place]) Active Problems:   Laryngeal cancer ([facility])   Metastasis of unknown origin ([facility])   Hyponatremia   Diarrhea   Lactic acidosis   COPD with chronic bronchitis ([facility])   CKD (chronic kidney disease) stage 3, GFR 30-59 ml/min ([facility])   Elevated troponin   Normocytic anemia   Sepsis: unclear if his presentation is due to infectious etiology or dehydration in the setting of diarrhea and poor p.o. Intake.  Has lactic acidosis and leukocytosis.  Responded quickly to IV resuscitation.  Status post Vanco and Zosyn. -Change antibiotic to cefepime and Flagyl. De-escalate as able -IV normal saline at 100 cc/h  Abdominal pain/diarrhea: Reports bowel accidents.  Concerning for constipation with overflow diarrhea.  CT abdomen did not suggest infectious etiology. -Bowel regimen  Lactic  acidosis/leukocytosis: Lactic acidosis resolved -Antibiotic and IV fluid as above  Metastasis of unknown origin: CT abdomen finding concerning for mets to liver, retroperitoneal lymph nodes, peritoneum and ribs. Patient with laryngeal cancer status post chemoradiation.  Completed chemoradiation in 12/2017. -Oncology consult in the morning  Hyponatremia: Na 123.  Baseline 130.  Likely due to dehydration in the setting of diarrhea poor p.o. intake.  -IV fluid as above  Dysphagia: reports difficulty swallowing solid foods.  Radiation for laryngeal cancer. -SLP eval -Soft diets  Productive cough: Patient with COPD and over 60-pack-year smoking history.  Not on COPD regimen.  He has no dyspnea.  Chest x-ray  without acute  finding. -Antibiotics as above -DuoNeb and Dulera  Elevated troponin: Patient without cardiopulmonary symptoms. -Check EKG -Trend troponin  History of DVT: On Xarelto.  Good compliance -Change to Lovenox in the setting of malignancy.  CKD 3-stable -Due to monitoring  DVT prophylaxis: Lovenox per pharmacy  Code Status: DNR Family Communication: No family member at bedside. Disposition Plan: Admit to telemetry Consults called: Consult oncology in the morning Admission status: Inpatient status.   Mercy Riding MD [facility] Pager [phone]  If 7PM-7AM, please contact night-coverage [email or web address] Password [email or web address]  05/30/2018, 9:09 PM

## 2018-05-25 NOTE — ED Triage Notes (Signed)
Patient sent by Dr. Gerarda Fraction for dehydration and hypotension. Patient reports diarrhea x 3 days. Unable to eat.

## 2018-05-25 NOTE — ED Notes (Signed)
Date and time results received: 05/26/2018 3:45 PM  (use smartphrase ".now" to insert current time)  Test: trop Critical Value: 0.04  Name of Provider Notified: Dr Vanita Panda   Orders Received? Or Actions Taken

## 2018-05-25 NOTE — ED Provider Notes (Signed)
[facility] EMERGENCY DEPARTMENT Provider Note   CSN: [id number] Arrival date & time: 06/01/2018  1352     History   Chief Complaint Chief Complaint  Patient presents with  . Hypotension    HPI Timothy Parks is a 57 y.o. male.  HPI Presents with concern of weakness, watery stool. Patient has history of laryngeal malignancy, last chemotherapy several months ago. He notes that for the past 3 weeks he has been progressively more weak An episode of substantial diarrhea at the beginning of illness, which improved after OTC medication, but has recurred in the last few days. No fever, no chills, no vomiting. There is associated anorexia, generalized weakness, and fatigue with minimal activity.  Past Medical History:  Diagnosis Date  . Anxiety   . Cancer ([place])    laryngeal  . COPD (chronic obstructive pulmonary disease) ([place])    pt states he was told he has COPD by health department  . Hypertension   . 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 Laryngeal cancer ([place]) 09/29/2017  . Goals of care, counseling/discussion 09/29/2017    Past Surgical History:  Procedure Laterality Date  . BLADDER SURGERY    . MICROLARYNGOSCOPY N/A 09/20/2017   Procedure: MICRO LARYNGOSCOPY WITH BIOPSY OF LARYNGEAL MASS;  Surgeon: Leta Baptist, MD;  Location: [place];  Service: ENT;  Laterality: N/A;  . PORTACATH PLACEMENT Left 10/21/2017   Procedure: INSERTION PORT-A-CATH;  Surgeon: Virl Cagey, MD;  Location: [facility];  Service: General;  Laterality: Left;        Home Medications    Prior to Admission medications   Medication Sig Start Date End Date Taking? Authorizing Provider  acetaminophen (TYLENOL) 500 MG tablet Take 1,000 mg by mouth every 6 (six) hours as needed.   Yes [provider]  alum & mag hydroxide-simeth (MAALOX/MYLANTA) 200-200-20 MG/5ML suspension Take 30 mLs by mouth as needed for indigestion  or heartburn.   Yes [provider]  amLODipine (NORVASC) 10 MG tablet Take 10 mg by mouth at bedtime.    Yes [provider]  doxylamine, Sleep, (UNISOM) 25 MG tablet Take 25 mg by mouth at bedtime as needed for sleep.   Yes [provider]  FLUoxetine (PROZAC) 20 MG tablet Take 60 mg by mouth daily.    Yes [provider]  gabapentin (NEURONTIN) 300 MG capsule Take 1 capsule (300 mg total) by mouth 3 (three) times daily. 11/29/17  Yes Holley Bouche, NP  lidocaine-prilocaine (EMLA) cream Apply 1 application topically as directed. 10/17/17  Yes [provider]  lovastatin (MEVACOR) 20 MG tablet Take 20 mg by mouth at bedtime.   Yes [provider]  ondansetron (ZOFRAN) 8 MG tablet Take 1 tablet by mouth 2 (two) times daily as needed. 12/19/17 12/19/18 Yes [provider]  rivaroxaban (XARELTO) 20 MG TABS tablet Take 1 tablet (20 mg total) by mouth daily with supper. 01/06/18 06/04/2018 Yes Lorin Glass, PA-C  prochlorperazine (COMPAZINE) 10 MG tablet Take 1 tablet (10 mg total) by mouth every 6 (six) hours as needed (Nausea or vomiting). 10/17/17 12/06/17  Derek Jack, MD    Family History Family History  Problem Relation Age of Onset  . Bladder Cancer Mother        67  . Colon cancer Father   . Thyroid disease Sister   . Heart disease Brother        stent  placement  . Heart disease Maternal Grandmother   . Diabetes Maternal Grandfather   . Cancer Paternal Grandmother   . AAA (abdominal aortic aneurysm) Paternal Grandfather     Social History Social History   Tobacco Use  . Smoking status: Current Some Day Smoker    Packs/day: 0.00    Types: Cigarettes  . Smokeless tobacco: Current User    Types: Snuff  . Tobacco comment: 2 cigarettes with coffee in the morning  Substance Use Topics  . Alcohol use: No    Frequency: Never    Comment: in recovery x since 2013  . Drug use: No     Allergies     Tetracyclines & related   Review of Systems Review of Systems  Constitutional:       Per HPI, otherwise negative  HENT:       Per HPI, otherwise negative  Respiratory:       Per HPI, otherwise negative  Cardiovascular:       Per HPI, otherwise negative  Gastrointestinal: Positive for diarrhea and nausea. Negative for abdominal pain and vomiting.  Endocrine:       Negative aside from HPI  Genitourinary:       Neg aside from HPI   Musculoskeletal:       Per HPI, otherwise negative  Skin: Negative.   Neurological: Positive for weakness. Negative for syncope.     Physical Exam Updated Vital Signs BP (!) 83/67   Pulse 98   Temp (!) 97.5 F (36.4 C) (Oral)   Resp (!) 22   Ht 5\' 7"  (1.702 m)   Wt 68.9 kg   SpO2 94%   BMI 23.81 kg/m   Physical Exam  Constitutional: He is oriented to person, place, and time. He has a sickly appearance.  Sickly appearing frail elderly appearing male  HENT:  Head: Normocephalic and atraumatic.  Eyes: Conjunctivae and EOM are normal.  Cardiovascular: Normal rate and regular rhythm.  Pulmonary/Chest: He has decreased breath sounds.  Abdominal: He exhibits no distension. There is no tenderness. There is no rigidity and no guarding.  Musculoskeletal: He exhibits no edema.  Neurological: He is alert and oriented to person, place, and time.  Skin: Skin is warm and dry.  Psychiatric: He has a normal mood and affect.  Nursing note and vitals reviewed.    ED Treatments / Results  Labs (all labs ordered are listed, but only abnormal results are displayed) Labs Reviewed  COMPREHENSIVE METABOLIC PANEL - Abnormal; Notable for the following components:      Result Value   Sodium 123 (*)    Chloride 85 (*)    Glucose, Bld 103 (*)    BUN 59 (*)    Creatinine, Ser 1.40 (*)    Calcium 8.1 (*)    Albumin 2.1 (*)    AST 57 (*)    Alkaline Phosphatase 151 (*)    GFR calc non Af Amer 54 (*)    All other components within normal limits  BRAIN  NATRIURETIC PEPTIDE - Abnormal; Notable for the following components:   B Natriuretic Peptide 142.0 (*)    All other components within normal limits  TROPONIN I - Abnormal; Notable for the following components:   Troponin I 0.04 (*)    All other components within normal limits  CBC WITH DIFFERENTIAL/PLATELET - Abnormal; Notable for the following components:   WBC 28.5 (*)    RBC 2.95 (*)    Hemoglobin 8.1 (*)    HCT  25.4 (*)    Platelets 522 (*)    Neutro Abs 25.1 (*)    Abs Immature Granulocytes 1.62 (*)    All other components within normal limits  URINALYSIS, ROUTINE W REFLEX MICROSCOPIC - Abnormal; Notable for the following components:   APPearance CLOUDY (*)    All other components within normal limits  I-STAT CG4 LACTIC ACID, ED - Abnormal; Notable for the following components:   Lactic Acid, Venous 2.68 (*)    All other components within normal limits  CULTURE, BLOOD (ROUTINE X 2)  LIPASE, BLOOD  LACTIC ACID, PLASMA  LACTIC ACID, PLASMA  I-STAT CG4 LACTIC ACID, ED  I-STAT CG4 LACTIC ACID, ED  I-STAT CG4 LACTIC ACID, ED    EKG None  Radiology Dg Chest 2 View  Result Date: 05/30/2018 CLINICAL DATA:  Weakness and diarrhea for 3 days, hypotension, history laryngeal cancer, hypertension, COPD, smoker EXAM: CHEST - 2 VIEW COMPARISON:  10/21/2017 FINDINGS: LEFT subclavian Port-A-Cath with tip projecting over SVC. Upper normal heart size. Mediastinal contours and pulmonary vascularity normal. Mild LEFT basilar atelectasis and questionable small subpulmonic pleural effusion. Lungs otherwise clear. No infiltrate, RIGHT pleural effusion or pneumothorax. IMPRESSION: LEFT basilar atelectasis and question small sub pulmonic pleural effusion. Electronically Signed   By: Lavonia Dana M.D.   On: 06/07/2018 16:00   Ct Abdomen Pelvis W Contrast  Result Date: 06/07/2018 CLINICAL DATA:  Cough, abdominal pain EXAM: CT ABDOMEN AND PELVIS WITH CONTRAST TECHNIQUE: Multidetector CT imaging of the  abdomen and pelvis was performed using the standard protocol following bolus administration of intravenous contrast. CONTRAST:  112mL ISOVUE-300 IOPAMIDOL (ISOVUE-300) INJECTION 61% COMPARISON:  PET-CT 10/03/2017 FINDINGS: Lower chest: Lung bases demonstrate trace left pleural effusion. Partial atelectasis at the left lung base. Heart size is normal. Hepatobiliary: Development of multiple poorly defined hypodense liver masses consistent with metastatic disease. Caudate lobe lesion measures 3.2 x 2.7 cm. Inferior right hepatic lobe mass lesion measures 3.3 by 2.7 cm. Left hepatic lobe lesion measures 3 x 2.1 cm. Multiple additional liver masses are noted. Pancreas: Unremarkable. No pancreatic ductal dilatation or surrounding inflammatory changes. Spleen: Normal in size without focal abnormality. Adrenals/Urinary Tract: Stable 2 cm left adrenal mass, no change since prior PET-CT. Right adrenal gland is normal. No hydronephrosis. Punctate stones in the kidneys. Bladder normal Stomach/Bowel: Stomach is within normal limits. Appendix appears normal. No evidence of bowel wall thickening, distention, or inflammatory changes. Vascular/Lymphatic: Moderate aortic atherosclerosis. No aneurysm. Age indeterminate occlusion of the right external iliac artery. Reconstitution of flow enhancement within the right common femoral artery. Increased retroperitoneal lymph nodes, measuring up to 6 mm in size. Porta hepatis nodule measuring 1 cm in size. Small subcentimeter nodules anterior to the right colon. Reproductive: Prostate is unremarkable. Other: No free air or free fluid. Generalized haziness and stranding within the mesentery and anterior omentum. Musculoskeletal: 3.8 x 2.1 cm soft tissue mass within the left posterior chest wall, this surrounds the left tenth rib. Expansile left eighth anterior rib lesion. IMPRESSION: 1. Interim development of multiple poorly defined hypodense liver masses consistent with metastatic disease.  2. Interval increase in retroperitoneal and upper abdominal nodes, also concerning for metastatic disease. Interim finding of hazy density within the mesentery and omentum. This finding in combination with interim finding of small intraperitoneal nodules anterior to the right colon is suspicious for peritoneal metastatic disease. 3. Development of 3.8 cm soft tissue mass surrounding the left tenth rib with additional expansile rib mass in the left eighth rib, concerning  for metastatic disease. 4. Trace left pleural effusion with partial atelectasis at the left base 5. Age indeterminate occlusion of the right external iliac artery. Reconstitution of flow at the right common femoral artery. 6. Punctate stones within both kidneys. 7. 2 cm left adrenal mass, no change since 10/03/2017 Electronically Signed   By: Donavan Foil M.D.   On: 06/13/2018 19:12    Procedures Procedures (including critical care time)  Medications Ordered in ED Medications  vancomycin (VANCOCIN) IVPB 1000 mg/200 mL premix (1,000 mg Intravenous New Bag/Given 05/21/2018 1851)  sodium chloride 0.9 % bolus 1,000 mL (0 mLs Intravenous Stopped 05/22/2018 1722)  piperacillin-tazobactam (ZOSYN) IVPB 3.375 g (0 g Intravenous Stopped 05/29/2018 1922)  iopamidol (ISOVUE-300) 61 % injection 100 mL (100 mLs Intravenous Contrast Given 06/01/2018 1728)    Initial Impression / Assessment and Plan / ED Course  I have reviewed the triage vital signs and the nursing notes.  Pertinent labs & imaging results that were available during my care of the patient were reviewed by me and considered in my medical decision making (see chart for details).    Sickly appearing male presents with weakness, diarrhea, noted to be hypotensive, hypoxic on initial evaluation. Patient received IV fluids, oxygen empirically.  EKG with sinus tachycardia, rate 106, RSR pattern in V1, cardiac artifact, abnormal  7:45 PM Pressure substantially better with a map 76, patient  awake and alert, no hypoxia. He continues to complain of pain, and together we reviewed the CT findings, notable for demonstration of multiple areas of metastatic spread. In comparison to PET scan from earlier this year, none of these areas illuminated with that study. Patient will receive additional analgesia and given concern for his hyponatremia, weakness, hypotension, will require admission for further evaluation and management. Initial broad-spectrum antibiotics provided, no obvious source of infection elicited, but with hypotension, lactic acidosis, leukocytosis, continued monitoring required for sepsis.  Final Clinical Impressions(s) / ED Diagnoses  Sepsis Hyponatremia  CRITICAL CARE Performed by: Carmin Muskrat Total critical care time: 45 minutes Critical care time was exclusive of separately billable procedures and treating other patients. Critical care was necessary to treat or prevent imminent or life-threatening deterioration. Critical care was time spent personally by me on the following activities: development of treatment plan with patient and/or surrogate as well as nursing, discussions with consultants, evaluation of patient's response to treatment, examination of patient, obtaining history from patient or surrogate, ordering and performing treatments and interventions, ordering and review of laboratory studies, ordering and review of radiographic studies, pulse oximetry and re-evaluation of patient's condition.    Carmin Muskrat, MD 05/21/2018 [id number]

## 2018-05-25 NOTE — ED Notes (Signed)
[facility] number [phone], call for any updates

## 2018-05-25 NOTE — Progress Notes (Addendum)
ANTICOAGULATION CONSULT NOTE - Initial Consult  Pharmacy Consult for Lovenox Indication: Hx DVT (Xarelto PTA).  Allergies  Allergen Reactions  . Tetracyclines & Related Anxiety    Patient Measurements: Height: 5\' 7"  (170.2 cm) Weight: 152 lb (68.9 kg) IBW/kg (Calculated) : 66.1 Heparin Dosing Weight:   Vital Signs: Temp: 97.5 F (36.4 C) (11/07 1400) Temp Source: Oral (11/07 1400) BP: 102/61 (11/07 2101) Pulse Rate: 109 (11/07 2101)  Labs: Recent Labs    05/26/2018 1451  HGB 8.1*  HCT 25.4*  PLT 522*  CREATININE 1.40*  TROPONINI 0.04*    Estimated Creatinine Clearance: 54.4 mL/min (A) (by C-G formula based on SCr of 1.4 mg/dL (H)).   Medical History: Past Medical History:  Diagnosis Date  . Anxiety   . Cancer ([place])    laryngeal  . COPD (chronic obstructive pulmonary disease) ([place])    pt states he was told he has COPD by health department  . Hypertension   . Recovering alcoholic ([place])     Medications:   (Not in a hospital admission)  Xarelto 20mg  PO daily PTA (last dose this morning at 10am).  Assessment: Okay for Protocol, no bleeding noted.  Low H/H.  Finalized admission H&P pending.  Goal of Therapy:  Systemic Anticoagulation.  Anti-Xa level 0.6-1 units/ml 4hrs after LMWH dose given if clinically indicated. Monitor platelets by anticoagulation protocol: Yes   Plan:  Lovenox 70mg  SQ every 12 hours.  First dose will be on 11/8 (24 hours after last documented Xarelto dose). Monitor for signs and symptoms of bleeding. CBC daily.  Pricilla Larsson 05/21/2018,9:20 PM   PM: Add Cefepime for Sepsis. 2gm IV every 12 hours. Monitor labs, micro and vitals.   Pricilla Larsson, [facility] 06/15/2018 9:35 PM

## 2018-05-26 LAB — CBC
HEMATOCRIT: 22.5 % — AB (ref 39.0–52.0)
Hemoglobin: 7.1 g/dL — ABNORMAL LOW (ref 13.0–17.0)
MCH: 27.7 pg (ref 26.0–34.0)
MCHC: 31.6 g/dL (ref 30.0–36.0)
MCV: 87.9 fL (ref 80.0–100.0)
NRBC: 0.1 % (ref 0.0–0.2)
PLATELETS: 488 10*3/uL — AB (ref 150–400)
RBC: 2.56 MIL/uL — ABNORMAL LOW (ref 4.22–5.81)
RDW: 14.8 % (ref 11.5–15.5)
WBC: 28.2 10*3/uL — AB (ref 4.0–10.5)

## 2018-05-26 LAB — COMPREHENSIVE METABOLIC PANEL
ALT: 19 U/L (ref 0–44)
AST: 46 U/L — ABNORMAL HIGH (ref 15–41)
Albumin: 1.7 g/dL — ABNORMAL LOW (ref 3.5–5.0)
Alkaline Phosphatase: 126 U/L (ref 38–126)
Anion gap: 9 (ref 5–15)
BUN: 43 mg/dL — ABNORMAL HIGH (ref 6–20)
CHLORIDE: 94 mmol/L — AB (ref 98–111)
CO2: 22 mmol/L (ref 22–32)
Calcium: 7.3 mg/dL — ABNORMAL LOW (ref 8.9–10.3)
Creatinine, Ser: 0.95 mg/dL (ref 0.61–1.24)
Glucose, Bld: 92 mg/dL (ref 70–99)
POTASSIUM: 3.7 mmol/L (ref 3.5–5.1)
SODIUM: 125 mmol/L — AB (ref 135–145)
Total Bilirubin: 0.5 mg/dL (ref 0.3–1.2)
Total Protein: 5.5 g/dL — ABNORMAL LOW (ref 6.5–8.1)

## 2018-05-26 LAB — TROPONIN I
TROPONIN I: 0.04 ng/mL — AB (ref <0.03)
TROPONIN I: 0.04 ng/mL — AB (ref <0.03)
Troponin I: 0.04 ng/mL (ref <0.03)

## 2018-05-26 LAB — LACTIC ACID, PLASMA: Lactic Acid, Venous: 1.6 mmol/L (ref 0.5–1.9)

## 2018-05-26 MED ORDER — DIPHENHYDRAMINE HCL 25 MG PO CAPS
25.0000 mg | ORAL_CAPSULE | Freq: Every evening | ORAL | Status: DC | PRN
  Administered 2018-05-26: 25 mg via ORAL
  Filled 2018-05-26: qty 1

## 2018-05-26 MED ORDER — PRAVASTATIN SODIUM 10 MG PO TABS
10.0000 mg | ORAL_TABLET | Freq: Every day | ORAL | Status: DC
  Administered 2018-05-26 – 2018-06-08 (×14): 10 mg via ORAL
  Filled 2018-05-26 (×14): qty 1

## 2018-05-26 MED ORDER — DOXYLAMINE SUCCINATE (SLEEP) 25 MG PO TABS: 25.0000 mg | ORAL_TABLET | Freq: Every evening | ORAL | Status: DC | PRN

## 2018-05-26 MED ORDER — FLUOXETINE HCL 20 MG PO CAPS
60.0000 mg | ORAL_CAPSULE | Freq: Every day | ORAL | Status: DC
  Administered 2018-05-26 – 2018-06-07 (×13): 60 mg via ORAL
  Filled 2018-05-26 (×13): qty 3

## 2018-05-26 NOTE — Plan of Care (Signed)

## 2018-05-26 NOTE — Care Management Note (Signed)
Case Management Note  Patient Details  Name: Timothy Parks MRN: [id number] Date of Birth: 04/21/1961  Subjective/Objective:         Admitted with sepsis. Pt with metestatic cancer. Pt from home, lives alone. Has mother and brother who provide support. He has 2 sisters who "do not come around". He is ind with ADL's. Uses a cane with ambulation. He has no difficulty affording medications. He uses [facility] to get to appointments. Prefers to do as much as possible in [place], does not like having to go to [place] or [place] for things. He complains of insomnia.            Action/Plan: anticipate DC home with self care. CM will follow peripherally for DC planning needs that may arise.   Expected Discharge Date:     05/27/18             Expected Discharge Plan:  Home/Self Care  In-House Referral:  NA  Discharge planning Services  NA  Post Acute Care Choice:  NA Choice offered to:  NA  Status of Service:  In process, will continue to follow  If discussed at Long Length of Stay Meetings, dates discussed:    Additional Comments:  Sherald Barge, RN 05/26/2018, 3:17 PM

## 2018-05-26 NOTE — Evaluation (Signed)
Clinical/Bedside Swallow Evaluation Patient Details  Name: Timothy Parks MRN: [id number] Date of Birth: October 18, 1960  Today's Date: 05/26/2018 Time: SLP Start Time (ACUTE ONLY): 22 SLP Stop Time (ACUTE ONLY): 1211 SLP Time Calculation (min) (ACUTE ONLY): 35 min  Past Medical History:  Past Medical History:  Diagnosis Date  . Anxiety   . Cancer ([place])    laryngeal  . COPD (chronic obstructive pulmonary disease) ([place])    pt states he was told he has COPD by health department  . Hypertension   . Recovering alcoholic [facility])    Past Surgical History:  Past Surgical History:  Procedure Laterality Date  . BLADDER SURGERY    . MICROLARYNGOSCOPY N/A 09/20/2017   Procedure: MICRO LARYNGOSCOPY WITH BIOPSY OF LARYNGEAL MASS;  Surgeon: Leta Baptist, MD;  Location: [place];  Service: ENT;  Laterality: N/A;  . PORTACATH PLACEMENT Left 10/21/2017   Procedure: INSERTION PORT-A-CATH;  Surgeon: Virl Cagey, MD;  Location: [facility];  Service: General;  Laterality: Left;   HPI:  ASHELY Parks is a 57 y.o. 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 Pr reports difficulty swallowing solids.   Assessment / Plan / Recommendation Clinical Impression  Pt was seated upright in bed for clinical swallwoing evaluation. Oral mechanism exam was [facility] however note missing dentition; pt reports dentures or partials are not here at the hospital. Pt consumed mechanical soft textures with prolonged oral prep stage reporting "I have to chew everything up really well". Pt occasionally regurgitated food during eval that "got stuck" providing more thorough mastication prior to swallowing the bolus again, but this is evidently baseline; Pt reports this is common with consumption of solid textures since radiation and  chemo. Delayed coughing was occasionally noted with thin liquids however Pt was noted to have a congested cough at baseline. In care everywhere note Pt is being followed at [facility] where he recieved an Indirect flexible fiberoptic nasopharyngeal laryngoscopy 02/15/2018 revealing good cord apposition and several small food particles in the vallecula (pt reported having a snack just prior to the procedure). Pt reports he recently (in the last couple of weeks) saw an SLP at [facility] where it was recommended to continue with thin liquids and soft, moist foods. Recommend continue with D3/mechanical soft diet and thin liquids; meds whole with liquids - one at a time per Pt. SLP will continue to follow acutely to reinforce compensatory strategies, provide education and determine potential need for instrumental testing to objectively assess the swallowing function.  SLP Visit Diagnosis: Dysphagia, oropharyngeal phase (R13.12)    Aspiration Risk  Mild aspiration risk;Moderate aspiration risk    Diet Recommendation Dysphagia 3 (Mech soft);Thin liquid   Liquid Administration via: Straw;Cup Medication Administration: Whole meds with liquid Supervision: Patient able to self feed;Intermittent supervision to cue for compensatory strategies Compensations: Minimize environmental distractions;Slow rate;Small sips/bites;Multiple dry swallows after each bite/sip;Follow solids with liquid;Clear throat intermittently Postural Changes: Seated upright at 90 degrees;Remain upright for at least 30 minutes after po intake    Other  Recommendations Oral Care Recommendations: Oral care BID   Follow up Recommendations        Frequency and Duration min 1 x/week  2 weeks       Prognosis Prognosis for Safe Diet Advancement: Fair Barriers to Reach Goals: Severity of deficits  Swallow Study   General Date of Onset: 06/01/2018 HPI: Timothy Parks is a 57 y.o. male with medical history significant  for laryngeal cancer status post chemo and radiation, COPD, hypertension, anxiety, DVT on Xarelto and tobacco use disorder who presented to ED with 3 weeks of diarrhea and weakness. Pr reports difficulty swallowing solids. Type of Study: Bedside Swallow Evaluation Previous Swallow Assessment: (Reports swallow test at [facility] however not in chart) Diet Prior to this Study: Dysphagia 3 (soft);Thin liquids Temperature Spikes Noted: No Respiratory Status: Room air History of Recent Intubation: No Behavior/Cognition: Alert;Cooperative;Pleasant mood Oral Cavity Assessment: Dry Oral Cavity - Dentition: Dentures, not available Vision: Functional for self-feeding Self-Feeding Abilities: Able to feed self Patient Positioning: Upright in bed Baseline Vocal Quality: Hoarse Volitional Cough: Strong Volitional Swallow: Able to elicit    Oral/Motor/Sensory Function Overall Oral Motor/Sensory Function: Within functional limits   Ice Chips Ice chips: Within functional limits   Thin Liquid Thin Liquid: Impaired Presentation: Cup;Straw Pharyngeal  Phase Impairments: Throat Clearing - Delayed;Cough - Delayed    Nectar Thick Nectar Thick Liquid: Not tested   Honey Thick Honey Thick Liquid: Not tested   Puree     Solid     Solid: Impaired Presentation: Self Fed Oral Phase Functional Implications: Prolonged oral transit Pharyngeal Phase Impairments: Suspected delayed Swallow;Multiple swallows     Amelia H. Roddie Mc, CCC-SLP Speech Language Pathologist  Wende Bushy 05/26/2018,2:09 PM

## 2018-05-26 NOTE — Progress Notes (Signed)
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   Assessment & Plan:   Principal Problem:   Sepsis ([place]) Active Problems:   Laryngeal cancer ([place])   Metastasis of unknown origin (HCC)   Hyponatremia   Diarrhea   Lactic acidosis   COPD with chronic bronchitis (HCC)   CKD (chronic kidney disease) stage 3, GFR 30-59 ml/min (HCC)   Elevated troponin   Normocytic anemia   #) Sepsis of unclear source: Patient presented with abdominal pain and diarrhea.  He is recently had chemotherapy some months ago but no other localizing signs or symptoms.  His CT abdomen pelvis was unremarkable.  Suspect there is possibly a diarrheal illness causing his symptoms. -Continue IV cefepime and metronidazole started 06/12/2018 -Follow blood cultures ordered 05/24/2018 -GI panel PCR and C. difficile quick scan ordered   #) Laryngeal cancer with new metastatic disease: Patient status post platinum-based chemotherapy and radiation.  His CT abdomen pelvis on admission showed evidence of diffuse metastatic disease in the liver, lymph nodes of the stomach, rib.  Suspect most likely laryngeal cancer. -Oncology consult  #) Hypertension/hyperlipidemia: -Patient was taken off amlodipine 10 mg -Continue statin  #) Right lower extremity DVT: This was diagnosed several months ago. -Hold rivaroxaban -Continue enoxaparin 1 mg/kg  #) COPD: Currently not wheezing, not on any treatments at home.  #) Pain/psych: -Continue gabapentin 300 mg 3 times daily -Continue fluoxetine 60 mg  daily  Fluids: Gentle IV fluids Letter lites: Monitor and supplement Nutrition: Regular diet  Prophylaxis: Treatment dose Lovenox  Disposition: Pending etiology of sepsis and treatment  DNR   Consultants:   Oncology  Procedures:   None  Antimicrobials:   Continue IV cefepime and metronidazole started 06/15/2018   Subjective: This morning patient reports that his diarrhea and abdominal pain have improved.  He reports that he is quite concerned about the new lesions found on his abdomen.  He denies any cough, congestion, chest pain.  He continues to be febrile this morning.  Objective: Vitals:   06/05/2018 2216 06/11/2018 2257 05/26/18 0703 05/26/18 0736  BP: (!) 101/56  (!) 102/55   Pulse: (!) 106  (!) 113   Resp: 20     Temp: 98.2 F (36.8 C)  (!) 101.7 F (38.7 C)   TempSrc: Oral  Oral   SpO2: 94% 94% 92% 94%  Weight: 70.5 kg     Height:        Intake/Output Summary (Last 24 hours) at 05/26/2018 1021 Last data filed at 05/26/2018 0400 Gross per 24 hour  Intake 1939.31 ml  Output 200 ml  Net 1739.31 ml   Filed Weights   06/12/2018 1401 06/17/2018 2216  Weight: 68.9 kg 70.5 kg    Examination:  General exam: Appears calm and comfortable  Respiratory system: Clear to auscultation. Respiratory effort normal. Cardiovascular system: Regular rate and rhythm, no murmurs. Gastrointestinal system: Soft, nondistended, no rebound or guarding, plus bowel sounds Central nervous system: Alert and oriented.  Grossly intact, moving all extremities Extremities: No lower extremity edema. Skin: No rashes over visible skin Psychiatry: Judgement and insight appear normal. Mood & affect depressed.  Data Reviewed: I have personally reviewed following labs and imaging studies  CBC: Recent Labs  Lab 06/12/2018 1451 05/26/18 0506  WBC 28.5* 28.2*  NEUTROABS 25.1*  --   HGB 8.1* 7.1*  HCT 25.4* 22.5*  MCV 86.1 87.9  PLT 522* 564*   Basic Metabolic Panel: Recent Labs   Lab 06/11/2018 1451 05/26/18 0506  NA 123* 125*  K 3.9 3.7  CL 85* 94*  CO2 23 22  GLUCOSE 103* 92  BUN 59* 43*  CREATININE 1.40* 0.95  CALCIUM 8.1* 7.3*   GFR: Estimated Creatinine Clearance: 80.2 mL/min (by C-G formula based on SCr of 0.95 mg/dL). Liver Function Tests: Recent Labs  Lab 05/30/2018 1451 05/26/18 0506  AST 57* 46*  ALT 24 19  ALKPHOS 151* 126  BILITOT 1.0 0.5  PROT 6.8 5.5*  ALBUMIN 2.1* 1.7*   Recent Labs  Lab 05/27/2018 1451  LIPASE 26   No results for input(s): AMMONIA in the last 168 hours. Coagulation Profile: No results for input(s): INR, PROTIME in the last 168 hours. Cardiac Enzymes: Recent Labs  Lab 05/24/2018 1451 06/04/2018 2338 05/26/18 0506  TROPONINI 0.04* 0.04* 0.04*   BNP (last 3 results) No results for input(s): PROBNP in the last 8760 hours. HbA1C: No results for input(s): HGBA1C in the last 72 hours. CBG: No results for input(s): GLUCAP in the last 168 hours. Lipid Profile: No results for input(s): CHOL, HDL, LDLCALC, TRIG, CHOLHDL, LDLDIRECT in the last 72 hours. Thyroid Function Tests: No results for input(s): TSH, T4TOTAL, FREET4, T3FREE, THYROIDAB in the last 72 hours. Anemia Panel: No results for input(s): VITAMINB12, FOLATE, FERRITIN, TIBC, IRON, RETICCTPCT in the last 72 hours. Sepsis Labs: Recent Labs  Lab 05/30/2018 1458 05/23/2018 2006 05/26/2018 2338  LATICACIDVEN 2.68* 1.8 1.6    Recent Results (from the past 240 hour(s))  Blood Culture (routine x 2)     Status: None (Preliminary result)   Collection Time: 06/17/2018  4:29 PM  Result Value Ref Range Status   Specimen Description BLOOD RIGHT WRIST  Final   Special Requests   Final    BOTTLES DRAWN AEROBIC AND ANAEROBIC Blood Culture adequate volume   Culture   Final    NO GROWTH < 24 HOURS Performed at [facility], [address], [place], [place] [address]    Report Status PENDING  Incomplete         Radiology Studies: Dg Chest 2 View  Result Date:  06/07/2018 CLINICAL DATA:  Weakness and diarrhea for 3 days, hypotension, history laryngeal cancer, hypertension, COPD, smoker EXAM: CHEST - 2 VIEW COMPARISON:  10/21/2017 FINDINGS: LEFT subclavian Port-A-Cath with tip projecting over SVC. Upper normal heart size. Mediastinal contours and pulmonary vascularity normal. Mild LEFT basilar atelectasis and questionable small subpulmonic pleural effusion. Lungs otherwise clear. No infiltrate, RIGHT pleural effusion or pneumothorax. IMPRESSION: LEFT basilar atelectasis and question small sub pulmonic pleural effusion. Electronically Signed   By: Lavonia Dana M.D.   On: 06/05/2018 16:00   Ct Abdomen Pelvis W Contrast  Result Date: 06/16/2018 CLINICAL DATA:  Cough, abdominal pain EXAM: CT ABDOMEN AND PELVIS WITH CONTRAST TECHNIQUE: Multidetector CT imaging of the abdomen and pelvis was performed using the standard protocol following bolus administration of intravenous contrast. CONTRAST:  197mL ISOVUE-300 IOPAMIDOL (ISOVUE-300) INJECTION 61% COMPARISON:  PET-CT 10/03/2017 FINDINGS: Lower chest: Lung bases demonstrate trace left pleural effusion. Partial atelectasis at the left lung base. Heart size is normal. Hepatobiliary: Development of multiple poorly defined hypodense liver masses consistent with metastatic  disease. Caudate lobe lesion measures 3.2 x 2.7 cm. Inferior right hepatic lobe mass lesion measures 3.3 by 2.7 cm. Left hepatic lobe lesion measures 3 x 2.1 cm. Multiple additional liver masses are noted. Pancreas: Unremarkable. No pancreatic ductal dilatation or surrounding inflammatory changes. Spleen: Normal in size without focal abnormality. Adrenals/Urinary Tract: Stable 2 cm left adrenal mass, no change since prior PET-CT. Right adrenal gland is normal. No hydronephrosis. Punctate stones in the kidneys. Bladder normal Stomach/Bowel: Stomach is within normal limits. Appendix appears normal. No evidence of bowel wall thickening, distention, or inflammatory  changes. Vascular/Lymphatic: Moderate aortic atherosclerosis. No aneurysm. Age indeterminate occlusion of the right external iliac artery. Reconstitution of flow enhancement within the right common femoral artery. Increased retroperitoneal lymph nodes, measuring up to 6 mm in size. Porta hepatis nodule measuring 1 cm in size. Small subcentimeter nodules anterior to the right colon. Reproductive: Prostate is unremarkable. Other: No free air or free fluid. Generalized haziness and stranding within the mesentery and anterior omentum. Musculoskeletal: 3.8 x 2.1 cm soft tissue mass within the left posterior chest wall, this surrounds the left tenth rib. Expansile left eighth anterior rib lesion. IMPRESSION: 1. Interim development of multiple poorly defined hypodense liver masses consistent with metastatic disease. 2. Interval increase in retroperitoneal and upper abdominal nodes, also concerning for metastatic disease. Interim finding of hazy density within the mesentery and omentum. This finding in combination with interim finding of small intraperitoneal nodules anterior to the right colon is suspicious for peritoneal metastatic disease. 3. Development of 3.8 cm soft tissue mass surrounding the left tenth rib with additional expansile rib mass in the left eighth rib, concerning for metastatic disease. 4. Trace left pleural effusion with partial atelectasis at the left base 5. Age indeterminate occlusion of the right external iliac artery. Reconstitution of flow at the right common femoral artery. 6. Punctate stones within both kidneys. 7. 2 cm left adrenal mass, no change since 10/03/2017 Electronically Signed   By: Donavan Foil M.D.   On: 06/09/2018 19:12        Scheduled Meds: . enoxaparin (LOVENOX) injection  1 mg/kg Subcutaneous Q12H  . gabapentin  300 mg Oral TID  . ipratropium-albuterol  3 mL Nebulization TID  . mometasone-formoterol  2 puff Inhalation BID  . polyethylene glycol  17 g Oral Daily    Continuous Infusions: . sodium chloride 125 mL/hr at 05/26/18 0348  . ceFEPime (MAXIPIME) IV 2 g (05/26/18 1018)  . metronidazole 500 mg (05/26/18 0825)     LOS: 1 day    Time spent: [place], MD [facility]  If 7PM-7AM, please contact night-coverage [email or web address] Password [email or web address] 05/26/2018, 10:21 AM

## 2018-05-27 DIAGNOSIS — L8990 Pressure ulcer of unspecified site, unspecified stage: Secondary | ICD-10-CM

## 2018-05-27 LAB — CBC
HCT: 22.1 % — ABNORMAL LOW (ref 39.0–52.0)
HCT: 28.2 % — ABNORMAL LOW (ref 39.0–52.0)
Hemoglobin: 6.8 g/dL — CL (ref 13.0–17.0)
Hemoglobin: 8.9 g/dL — ABNORMAL LOW (ref 13.0–17.0)
MCH: 27.6 pg (ref 26.0–34.0)
MCH: 27.8 pg (ref 26.0–34.0)
MCHC: 30.8 g/dL (ref 30.0–36.0)
MCHC: 31.6 g/dL (ref 30.0–36.0)
MCV: 89.8 fL (ref 80.0–100.0)
MCV: 88.1 fL (ref 80.0–100.0)
NRBC: 0.1 % (ref 0.0–0.2)
PLATELETS: 400 10*3/uL (ref 150–400)
Platelets: 462 10*3/uL — ABNORMAL HIGH (ref 150–400)
RBC: 2.46 MIL/uL — ABNORMAL LOW (ref 4.22–5.81)
RBC: 3.2 MIL/uL — ABNORMAL LOW (ref 4.22–5.81)
RDW: 15.1 % (ref 11.5–15.5)
RDW: 15.2 % (ref 11.5–15.5)
WBC: 18 K/uL — ABNORMAL HIGH (ref 4.0–10.5)
WBC: 16.6 10*3/uL — AB (ref 4.0–10.5)
nRBC: 0.2 % (ref 0.0–0.2)

## 2018-05-27 LAB — PROCALCITONIN: Procalcitonin: 0.83 ng/mL

## 2018-05-27 LAB — BASIC METABOLIC PANEL
Anion gap: 7 (ref 5–15)
BUN: 24 mg/dL — ABNORMAL HIGH (ref 6–20)
CO2: 21 mmol/L — ABNORMAL LOW (ref 22–32)
Calcium: 7.5 mg/dL — ABNORMAL LOW (ref 8.9–10.3)
Creatinine, Ser: 0.76 mg/dL (ref 0.61–1.24)
GFR calc non Af Amer: >60 mL/min (ref >60)
Glucose, Bld: 119 mg/dL — ABNORMAL HIGH (ref 70–99)

## 2018-05-27 LAB — HEMOGLOBIN AND HEMATOCRIT, BLOOD
HCT: 28.6 % — ABNORMAL LOW (ref 39.0–52.0)
Hemoglobin: 9.1 g/dL — ABNORMAL LOW (ref 13.0–17.0)

## 2018-05-27 LAB — BASIC METABOLIC PANEL WITH GFR
Chloride: 101 mmol/L (ref 98–111)
GFR calc Af Amer: >60 mL/min (ref >60)
Potassium: 3.5 mmol/L (ref 3.5–5.1)
Sodium: 129 mmol/L — ABNORMAL LOW (ref 135–145)

## 2018-05-27 LAB — LACTIC ACID, PLASMA: Lactic Acid, Venous: 1.2 mmol/L (ref 0.5–1.9)

## 2018-05-27 LAB — PREPARE RBC (CROSSMATCH)

## 2018-05-27 LAB — MAGNESIUM: Magnesium: 2 mg/dL (ref 1.7–2.4)

## 2018-05-27 LAB — HIV ANTIBODY (ROUTINE TESTING W REFLEX): HIV Screen 4th Generation wRfx: NONREACTIVE

## 2018-05-27 LAB — ABO/RH: ABO/RH(D): O POS

## 2018-05-27 MED ORDER — DIPHENHYDRAMINE HCL 25 MG PO CAPS
25.0000 mg | ORAL_CAPSULE | Freq: Every evening | ORAL | Status: DC | PRN
  Administered 2018-05-27: 25 mg via ORAL
  Filled 2018-05-27 (×2): qty 1

## 2018-05-27 MED ORDER — SODIUM CHLORIDE 0.9 % IV SOLN
2.0000 g | Freq: Three times a day (TID) | INTRAVENOUS | Status: DC
  Administered 2018-05-27 – 2018-06-01 (×16): 2 g via INTRAVENOUS
  Filled 2018-05-27 (×21): qty 2

## 2018-05-27 MED ORDER — SODIUM CHLORIDE 0.9% IV SOLUTION: Freq: Once | INTRAVENOUS | Status: DC

## 2018-05-27 MED ORDER — SODIUM CHLORIDE 0.9 % IV BOLUS
1000.0000 mL | Freq: Once | INTRAVENOUS | Status: AC
  Administered 2018-05-27: 1000 mL via INTRAVENOUS

## 2018-05-27 NOTE — Significant Event (Signed)
Patient's CODE STATUS changed to full code as requested by the patient.  Timothy Parks

## 2018-05-27 NOTE — Progress Notes (Signed)
Pharmacy Antibiotic Note  Timothy Parks is a 57 y.o. male admitted on 06/16/2018 with sepsis.  Pharmacy has been consulted for Cefepime dosing. Scr improving will adjust dose of cefepime. WBC improving Plan: Increase cefepime 2gm IV q8h F/U cxs and clinical progress. Monitor V/S, labs  Height: 5\' 7"  (170.2 cm) Weight: 155 lb 6.8 oz (70.5 kg) IBW/kg (Calculated) : 66.1  Temp (24hrs), Avg:98.6 F (37 C), Min:98.3 F (36.8 C), Max:99.1 F (37.3 C)  Recent Labs  Lab 06/05/2018 1451 06/06/2018 1458 06/13/2018 2006 06/06/2018 2338 05/26/18 0506 05/27/18 0651  WBC 28.5*  --   --   --  28.2* 18.0*  CREATININE 1.40*  --   --   --  0.95 0.76  LATICACIDVEN  --  2.68* 1.8 1.6  --   --     Estimated Creatinine Clearance: 95.2 mL/min (by C-G formula based on SCr of 0.76 mg/dL).    Allergies  Allergen Reactions  . Tetracyclines & Related Anxiety    Antimicrobials this admission:  Cefepime 11/7 >>  Flagyl 11/7 >>  Vancomycin and Zosyn 11/7 x 1  Dose adjustments this admission:  11/9 INcrease cefepime 2gm IV q8h  Microbiology results:  11/7 BCx: ngtd  Thank you for allowing pharmacy to be a part of this patient's care.  Isac Sarna, BS Pharm D, [place] Clinical Pharmacist Pager [phone] 05/27/2018 9:17 AM

## 2018-05-27 NOTE — Progress Notes (Signed)
MD contacted in regards to BP 65/50. One liter bolus given. Continue to monitor.

## 2018-05-27 NOTE — Progress Notes (Signed)
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   Assessment & Plan:   Principal Problem:   Sepsis ([place]) Active Problems:   Laryngeal cancer ([place])   Metastasis of unknown origin ([place])   Hyponatremia   Diarrhea   Lactic acidosis   COPD with chronic bronchitis (HCC)   CKD (chronic kidney disease) stage 3, GFR 30-59 ml/min (HCC)   Elevated troponin   Normocytic anemia   Pressure injury of skin   #) Sepsis of unclear source: Fever has resolved.  Blood cultures are negative thus far.  Source is unclear as his diarrhea is gone as well. -Continue IV cefepime and metronidazole started 05/23/2018, will consider discontinuing after 48 hours - blood cultures ordered 06/09/2018 no growth to date -GI panel PCR and C. difficile quick scan ordered -Procalcitonin ordered  #) Laryngeal cancer with new metastatic disease: Patient status post platinum-based chemotherapy and radiation.  His CT abdomen pelvis on admission showed evidence of diffuse metastatic disease in the liver, lymph nodes of the stomach, rib.  Suspect most likely metastatic laryngeal cancer. -Oncology consult  #) Anemia: Patient is anemic today to 6.8 requiring blood transfusion.  The etiology of this is unclear though suspect it is likely related to his CKD, anemia of chronic disease, nutritional.  Have low suspicion of any bleeding at this time as it is drifted down.  We will continue anti-coagulation. -We will order TIBC, ferritin, iron, T sat - We will order B12 and folate -We will order  reticulocyte count  #) Hypertension/hyperlipidemia: -Continue statin  #) Right lower extremity DVT: This was diagnosed several months ago. -Hold rivaroxaban -Continue enoxaparin 1 mg/kg  #) COPD: Currently not wheezing, not on any treatments at home.  #) Pain/psych: -Continue gabapentin 300 mg 3 times daily -Continue fluoxetine 60 mg daily  Fluids: Tolerating p.o. Letter lites: Monitor and supplement Nutrition: Regular diet  Prophylaxis: Treatment dose Lovenox  Disposition: Pending etiology of sepsis and treatment  DNR   Consultants:   Oncology  Procedures:   None  Antimicrobials:   Continue IV cefepime and metronidazole started 05/24/2018   Subjective: This morning patient reports he is feeling somewhat better.  He denies any nausea, vomiting, diarrhea, cough, congestion, rhinorrhea.  He is anemic today.  Objective: Vitals:   05/26/18 2117 05/27/18 0532 05/27/18 0539 05/27/18 0756  BP: (!) 96/59 (!) 58/45 98/62   Pulse: (!) 110 (!) 105    Resp: 20 20    Temp: 98.3 F (36.8 C) 99.1 F (37.3 C)    TempSrc: Oral Oral    SpO2: 95% 94%  (!) 88%  Weight:      Height:        Intake/Output Summary (Last 24 hours) at 05/27/2018 0949 Last data filed at 05/26/2018 1247 Gross per 24 hour  Intake 240 ml  Output 400 ml  Net -160 ml   Filed Weights   06/03/2018 1401 06/05/2018 2216  Weight: 68.9 kg 70.5 kg    Examination:  General exam: Appears calm and comfortable  Respiratory system: Clear to auscultation. Respiratory effort normal. Cardiovascular system: Regular rate and rhythm, no murmurs. Gastrointestinal system: Soft, nondistended, no  rebound or guarding, plus bowel sounds Central nervous system: Alert and oriented.  Grossly intact, moving all extremities Extremities: No lower extremity edema. Skin: No rashes over visible skin Psychiatry: Judgement and insight appear normal. Mood & affect depressed.     Data Reviewed: I have personally reviewed  following labs and imaging studies  CBC: Recent Labs  Lab 05/29/2018 1451 05/26/18 0506 05/27/18 0651  WBC 28.5* 28.2* 18.0*  NEUTROABS 25.1*  --   --   HGB 8.1* 7.1* 6.8*  HCT 25.4* 22.5* 22.1*  MCV 86.1 87.9 89.8  PLT 522* 488* 993*   Basic Metabolic Panel: Recent Labs  Lab 06/09/2018 1451 05/26/18 0506 05/27/18 0651  NA 123* 125* 129*  K 3.9 3.7 3.5  CL 85* 94* 101  CO2 23 22 21*  GLUCOSE 103* 92 119*  BUN 59* 43* 24*  CREATININE 1.40* 0.95 0.76  CALCIUM 8.1* 7.3* 7.5*  MG  --   --  2.0   GFR: Estimated Creatinine Clearance: 95.2 mL/min (by C-G formula based on SCr of 0.76 mg/dL). Liver Function Tests: Recent Labs  Lab 06/01/2018 1451 05/26/18 0506  AST 57* 46*  ALT 24 19  ALKPHOS 151* 126  BILITOT 1.0 0.5  PROT 6.8 5.5*  ALBUMIN 2.1* 1.7*   Recent Labs  Lab 06/14/2018 1451  LIPASE 26   No results for input(s): AMMONIA in the last 168 hours. Coagulation Profile: No results for input(s): INR, PROTIME in the last 168 hours. Cardiac Enzymes: Recent Labs  Lab 06/01/2018 1451 06/09/2018 2338 05/26/18 0506 05/26/18 1239  TROPONINI 0.04* 0.04* 0.04* 0.04*   BNP (last 3 results) No results for input(s): PROBNP in the last 8760 hours. HbA1C: No results for input(s): HGBA1C in the last 72 hours. CBG: No results for input(s): GLUCAP in the last 168 hours. Lipid Profile: No results for input(s): CHOL, HDL, LDLCALC, TRIG, CHOLHDL, LDLDIRECT in the last 72 hours. Thyroid Function Tests: No results for input(s): TSH, T4TOTAL, FREET4, T3FREE, THYROIDAB in the last 72 hours. Anemia Panel: No results for input(s): VITAMINB12, FOLATE, FERRITIN, TIBC, IRON, RETICCTPCT in the last 72 hours. Sepsis Labs: Recent Labs  Lab 05/27/2018 1458 06/06/2018 2006 05/24/2018 2338  LATICACIDVEN 2.68* 1.8 1.6    Recent Results (from the past 240 hour(s))  Blood Culture (routine x 2)     Status: None (Preliminary result)   Collection Time: 06/04/2018  4:29 PM  Result Value Ref Range  Status   Specimen Description BLOOD RIGHT WRIST  Final   Special Requests   Final    BOTTLES DRAWN AEROBIC AND ANAEROBIC Blood Culture adequate volume   Culture   Final    NO GROWTH 2 DAYS Performed at [facility], [address] [place], [place] [address]    Report Status PENDING  Incomplete         Radiology Studies: Dg Chest 2 View  Result Date: 06/01/2018 CLINICAL DATA:  Weakness and diarrhea for 3 days, hypotension, history laryngeal cancer, hypertension, COPD, smoker EXAM: CHEST - 2 VIEW COMPARISON:  10/21/2017 FINDINGS: LEFT subclavian Port-A-Cath with tip projecting over SVC. Upper normal heart size. Mediastinal contours and pulmonary vascularity normal. Mild LEFT basilar atelectasis and questionable small subpulmonic pleural effusion. Lungs otherwise clear. No infiltrate, RIGHT pleural effusion or pneumothorax. IMPRESSION: LEFT basilar atelectasis and question small sub pulmonic pleural effusion. Electronically Signed   By: Lavonia Dana M.D.   On: 06/17/2018 16:00   Ct Abdomen Pelvis W Contrast  Result Date: 06/03/2018 CLINICAL DATA:  Cough, abdominal  pain EXAM: CT ABDOMEN AND PELVIS WITH CONTRAST TECHNIQUE: Multidetector CT imaging of the abdomen and pelvis was performed using the standard protocol following bolus administration of intravenous contrast. CONTRAST:  133mL ISOVUE-300 IOPAMIDOL (ISOVUE-300) INJECTION 61% COMPARISON:  PET-CT 10/03/2017 FINDINGS: Lower chest: Lung bases demonstrate trace left pleural effusion. Partial atelectasis at the left lung base. Heart size is normal. Hepatobiliary: Development of multiple poorly defined hypodense liver masses consistent with metastatic disease. Caudate lobe lesion measures 3.2 x 2.7 cm. Inferior right hepatic lobe mass lesion measures 3.3 by 2.7 cm. Left hepatic lobe lesion measures 3 x 2.1 cm. Multiple additional liver masses are noted. Pancreas: Unremarkable. No pancreatic ductal dilatation or surrounding inflammatory changes.  Spleen: Normal in size without focal abnormality. Adrenals/Urinary Tract: Stable 2 cm left adrenal mass, no change since prior PET-CT. Right adrenal gland is normal. No hydronephrosis. Punctate stones in the kidneys. Bladder normal Stomach/Bowel: Stomach is within normal limits. Appendix appears normal. No evidence of bowel wall thickening, distention, or inflammatory changes. Vascular/Lymphatic: Moderate aortic atherosclerosis. No aneurysm. Age indeterminate occlusion of the right external iliac artery. Reconstitution of flow enhancement within the right common femoral artery. Increased retroperitoneal lymph nodes, measuring up to 6 mm in size. Porta hepatis nodule measuring 1 cm in size. Small subcentimeter nodules anterior to the right colon. Reproductive: Prostate is unremarkable. Other: No free air or free fluid. Generalized haziness and stranding within the mesentery and anterior omentum. Musculoskeletal: 3.8 x 2.1 cm soft tissue mass within the left posterior chest wall, this surrounds the left tenth rib. Expansile left eighth anterior rib lesion. IMPRESSION: 1. Interim development of multiple poorly defined hypodense liver masses consistent with metastatic disease. 2. Interval increase in retroperitoneal and upper abdominal nodes, also concerning for metastatic disease. Interim finding of hazy density within the mesentery and omentum. This finding in combination with interim finding of small intraperitoneal nodules anterior to the right colon is suspicious for peritoneal metastatic disease. 3. Development of 3.8 cm soft tissue mass surrounding the left tenth rib with additional expansile rib mass in the left eighth rib, concerning for metastatic disease. 4. Trace left pleural effusion with partial atelectasis at the left base 5. Age indeterminate occlusion of the right external iliac artery. Reconstitution of flow at the right common femoral artery. 6. Punctate stones within both kidneys. 7. 2 cm left  adrenal mass, no change since 10/03/2017 Electronically Signed   By: Donavan Foil M.D.   On: 05/23/2018 19:12        Scheduled Meds: . sodium chloride   Intravenous Once  . enoxaparin (LOVENOX) injection  1 mg/kg Subcutaneous Q12H  . FLUoxetine  60 mg Oral Daily  . gabapentin  300 mg Oral TID  . ipratropium-albuterol  3 mL Nebulization TID  . mometasone-formoterol  2 puff Inhalation BID  . polyethylene glycol  17 g Oral Daily  . pravastatin  10 mg Oral q1800   Continuous Infusions: . sodium chloride 125 mL/hr at 05/27/18 0807  . ceFEPime (MAXIPIME) IV    . metronidazole 500 mg (05/27/18 0809)     LOS: 2 days    Time spent: [place], MD [facility]  If 7PM-7AM, please contact night-coverage [email or web address] Password [facility] 05/27/2018, 9:49 AM

## 2018-05-27 NOTE — Progress Notes (Addendum)
CRITICAL VALUE ALERT  Critical Value:  HGB 6.8  Date & Time Notied:  05/27/18 @ 0734  Provider Notified: Purohit  Orders Received/Actions taken: Awaiting further orders

## 2018-05-28 LAB — CBC
HCT: 29.4 % — ABNORMAL LOW (ref 39.0–52.0)
Hemoglobin: 9.2 g/dL — ABNORMAL LOW (ref 13.0–17.0)
MCH: 28 pg (ref 26.0–34.0)
MCHC: 31.3 g/dL (ref 30.0–36.0)
MCV: 89.4 fL (ref 80.0–100.0)
Platelets: 418 10*3/uL — ABNORMAL HIGH (ref 150–400)
RBC: 3.29 MIL/uL — ABNORMAL LOW (ref 4.22–5.81)
RDW: 15.7 % — ABNORMAL HIGH (ref 11.5–15.5)
WBC: 20.3 10*3/uL — ABNORMAL HIGH (ref 4.0–10.5)
nRBC: 0 % (ref 0.0–0.2)

## 2018-05-28 LAB — TYPE AND SCREEN
ABO/RH(D): O POS
Antibody Screen: NEGATIVE
Unit division: 0
Unit division: 0

## 2018-05-28 LAB — BASIC METABOLIC PANEL WITH GFR
Anion gap: 6 (ref 5–15)
Creatinine, Ser: 0.66 mg/dL (ref 0.61–1.24)
GFR calc Af Amer: >60 mL/min (ref >60)
Glucose, Bld: 107 mg/dL — ABNORMAL HIGH (ref 70–99)

## 2018-05-28 LAB — BASIC METABOLIC PANEL
BUN: 17 mg/dL (ref 6–20)
CO2: 21 mmol/L — ABNORMAL LOW (ref 22–32)
Calcium: 7.6 mg/dL — ABNORMAL LOW (ref 8.9–10.3)
Chloride: 103 mmol/L (ref 98–111)
GFR calc non Af Amer: >60 mL/min (ref >60)
Potassium: 3.5 mmol/L (ref 3.5–5.1)
Sodium: 130 mmol/L — ABNORMAL LOW (ref 135–145)

## 2018-05-28 LAB — BPAM RBC
Blood Product Expiration Date: 201912062359
Blood Product Expiration Date: 201912062359
ISSUE DATE / TIME: 201911091122
ISSUE DATE / TIME: 201911091436
Unit Type and Rh: 5100
Unit Type and Rh: 5100

## 2018-05-28 LAB — LACTIC ACID, PLASMA: LACTIC ACID, VENOUS: 1 mmol/L (ref 0.5–1.9)

## 2018-05-28 LAB — VITAMIN B12: Vitamin B-12: 413 pg/mL (ref 180–914)

## 2018-05-28 LAB — RETICULOCYTES
Immature Retic Fract: 37.7 % — ABNORMAL HIGH (ref 2.3–15.9)
RBC.: 3.29 MIL/uL — ABNORMAL LOW (ref 4.22–5.81)
Retic Count, Absolute: 91.8 K/uL (ref 19.0–186.0)
Retic Ct Pct: 2.8 % (ref 0.4–3.1)

## 2018-05-28 LAB — IRON AND TIBC
Iron: 11 ug/dL — ABNORMAL LOW (ref 45–182)
Saturation Ratios: 8 % — ABNORMAL LOW (ref 17.9–39.5)
TIBC: 135 ug/dL — ABNORMAL LOW (ref 250–450)
UIBC: 124 ug/dL

## 2018-05-28 LAB — PROCALCITONIN: Procalcitonin: 0.78 ng/mL

## 2018-05-28 LAB — FERRITIN: Ferritin: 1892 ng/mL — ABNORMAL HIGH (ref 24–336)

## 2018-05-28 MED ORDER — RIVAROXABAN 20 MG PO TABS
20.0000 mg | ORAL_TABLET | Freq: Every day | ORAL | Status: DC
  Administered 2018-05-28 – 2018-06-08 (×12): 20 mg via ORAL
  Filled 2018-05-28 (×12): qty 1

## 2018-05-28 MED ORDER — FERROUS SULFATE 325 (65 FE) MG PO TABS
325.0000 mg | ORAL_TABLET | Freq: Every day | ORAL | Status: DC
  Administered 2018-05-29 – 2018-06-08 (×11): 325 mg via ORAL
  Filled 2018-05-28 (×11): qty 1

## 2018-05-28 MED ORDER — ZOLPIDEM TARTRATE 5 MG PO TABS
5.0000 mg | ORAL_TABLET | Freq: Every evening | ORAL | Status: DC | PRN
  Administered 2018-05-28 – 2018-05-31 (×3): 5 mg via ORAL
  Filled 2018-05-28 (×4): qty 1

## 2018-05-28 NOTE — Progress Notes (Addendum)
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   Assessment & Plan:   Principal Problem:   Sepsis ([place]) Active Problems:   Laryngeal cancer ([place])   Metastasis of unknown origin ([place])   Hyponatremia   Diarrhea   Lactic acidosis   COPD with chronic bronchitis (HCC)   CKD (chronic kidney disease) stage 3, GFR 30-59 ml/min (HCC)   Elevated troponin   Normocytic anemia   Pressure injury of skin   #) Sepsis of unclear source: Fever has resolved.  Blood cultures are negative thus far.  Source is unclear as his diarrhea is gone as well. -Continue IV cefepime and metronidazole started 06/17/2018, - Procalcitonin elevated but decreasing on empiric antibiotics without evidence of clear source - blood cultures ordered 05/19/2018 no growth to date  #) Laryngeal cancer with new metastatic disease: Patient status post platinum-based chemotherapy and radiation.  His CT abdomen pelvis on admission showed evidence of diffuse metastatic disease in the liver, lymph nodes of the stomach, rib.  Suspect most likely metastatic laryngeal cancer. -Oncology consult  #) Hypotension: Patient has had episodes of hypotension overnight.  These appear to be symptomatic but they are likely positional release related to which of arm is checked.  #) Iron deficiency anemia: Reticulocyte count is inappropriately low but elevated.  His iron and TIBC levels are low. -Start oral iron daily - B12 normal - folate ending -We will order reticulocyte count -Status post 2 units packed red blood  cells on 05/27/2018, improved hemoglobin  #) Hypertension/hyperlipidemia: -Continue statin  #) Right lower extremity DVT: This was diagnosed several months ago. -Restart rivaroxaban -Discontinue enoxaparin 1 mg/kg  #) COPD: Currently not wheezing, not on any treatments at home.  #) Pain/psych: -Continue gabapentin 300 mg 3 times daily -Continue fluoxetine 60 mg daily  Fluids: Tolerating p.o. Letter lites: Monitor and supplement Nutrition: Regular diet  Prophylaxis: Treatment dose Lovenox  Disposition: Pending treatment of sepsis of unclear source and oncology evaluation  Full code   Consultants:   Oncology  Procedures:   None  Antimicrobials:   Continue IV cefepime and metronidazole started 05/31/2018   Subjective: This morning patient reports he is feeling better.  He reports continuing to feel weak and is requesting more help at home.  He denies any nausea, vomiting, abdominal pain, cough, congestion, rhinorrhea. Patient was hypotensive last night and received a bolus of low it appears to be related to possibly which arm is checked. Objective: Vitals:   05/28/18 0522 05/28/18 0535 05/28/18 0754 05/28/18 0830  BP: (!) 80/41 111/71    Pulse: (!) 109     Resp: 18     Temp: 99.5 F (37.5 C)     TempSrc: Oral     SpO2: 94%  93% 95%  Weight:      Height:        Intake/Output Summary (Last 24 hours) at 05/28/2018 1003 Last data filed at 05/28/2018 0900 Gross per 24 hour  Intake 4126.56 ml  Output 850 ml  Net 3276.56 ml   Filed Weights   06/14/2018 1401 06/05/2018 2216  Weight: 68.9 kg 70.5 kg    Examination:  General exam: Appears calm and comfortable  Respiratory system: Clear to auscultation. Respiratory effort normal. Cardiovascular system: Regular rate and rhythm, no murmurs. Gastrointestinal system: Soft, nondistended, no rebound or guarding, plus bowel sounds Central nervous system: Alert and oriented.  Grossly intact, moving all  extremities Extremities: No lower extremity edema. Skin: Visible redness and erythema over neck Psychiatry: Judgement and insight appear normal. Mood & affect depressed.     Data Reviewed: I have personally reviewed following labs and imaging studies  CBC: Recent Labs  Lab 06/07/2018 1451 05/26/18 0506 05/27/18 0651 05/27/18 1810 05/27/18 2128 05/28/18 0637  WBC 28.5* 28.2* 18.0*  --  16.6* 20.3*  NEUTROABS 25.1*  --   --   --   --   --   HGB 8.1* 7.1* 6.8* 9.1* 8.9* 9.2*  HCT 25.4* 22.5* 22.1* 28.6* 28.2* 29.4*  MCV 86.1 87.9 89.8  --  88.1 89.4  PLT 522* 488* 462*  --  400 527*   Basic Metabolic Panel: Recent Labs  Lab 06/09/2018 1451 05/26/18 0506 05/27/18 0651 05/28/18 0637  NA 123* 125* 129* 130*  K 3.9 3.7 3.5 3.5  CL 85* 94* 101 103  CO2 23 22 21* 21*  GLUCOSE 103* 92 119* 107*  BUN 59* 43* 24* 17  CREATININE 1.40* 0.95 0.76 0.66  CALCIUM 8.1* 7.3* 7.5* 7.6*  MG  --   --  2.0  --    GFR: Estimated Creatinine Clearance: 95.2 mL/min (by C-G formula based on SCr of 0.66 mg/dL). Liver Function Tests: Recent Labs  Lab 06/16/2018 1451 05/26/18 0506  AST 57* 46*  ALT 24 19  ALKPHOS 151* 126  BILITOT 1.0 0.5  PROT 6.8 5.5*  ALBUMIN 2.1* 1.7*   Recent Labs  Lab 06/01/2018 1451  LIPASE 26   No results for input(s): AMMONIA in the last 168 hours. Coagulation Profile: No results for input(s): INR, PROTIME in the last 168 hours. Cardiac Enzymes: Recent Labs  Lab 05/21/2018 1451 05/29/2018 2338 05/26/18 0506 05/26/18 1239  TROPONINI 0.04* 0.04* 0.04* 0.04*   BNP (last 3 results) No results for input(s): PROBNP in the last 8760 hours. HbA1C: No results for input(s): HGBA1C in the last 72 hours. CBG: No results for input(s): GLUCAP in the last 168 hours. Lipid Profile: No results for input(s): CHOL, HDL, LDLCALC, TRIG, CHOLHDL, LDLDIRECT in the last 72 hours. Thyroid Function Tests: No results for input(s): TSH, T4TOTAL, FREET4, T3FREE, THYROIDAB in the  last 72 hours. Anemia Panel: Recent Labs    05/28/18 0637  VITAMINB12 413  TIBC 135*  IRON 11*  RETICCTPCT 2.8   Sepsis Labs: Recent Labs  Lab 06/11/2018 2006 05/28/2018 2338 05/27/18 0651 05/27/18 2128 05/28/18 0031 05/28/18 0637  PROCALCITON  --   --  0.83  --   --  0.78  LATICACIDVEN 1.8 1.6  --  1.2 1.0  --     Recent Results (from the past 240 hour(s))  Blood Culture (routine x 2)     Status: None (Preliminary result)   Collection Time: 06/12/2018  4:29 PM  Result Value Ref Range Status   Specimen Description BLOOD RIGHT WRIST  Final   Special Requests   Final    BOTTLES DRAWN AEROBIC AND ANAEROBIC Blood Culture adequate volume   Culture   Final    NO GROWTH 2 DAYS Performed at [facility], [address], [place], [place] [address]    Report Status PENDING  Incomplete         Radiology  Studies: No results found.      Scheduled Meds: . sodium chloride   Intravenous Once  . enoxaparin (LOVENOX) injection  1 mg/kg Subcutaneous Q12H  . FLUoxetine  60 mg Oral Daily  . gabapentin  300 mg Oral TID  . ipratropium-albuterol  3 mL Nebulization TID  . mometasone-formoterol  2 puff Inhalation BID  . polyethylene glycol  17 g Oral Daily  . pravastatin  10 mg Oral q1800   Continuous Infusions: . ceFEPime (MAXIPIME) IV Stopped (05/28/18 0052)  . metronidazole 500 mg (05/28/18 0927)     LOS: 3 days    Time spent: [place], MD [facility]  If 7PM-7AM, please contact night-coverage [email or web address] Password [email or web address] 05/28/2018, 10:03 AM

## 2018-05-29 ENCOUNTER — Inpatient Hospital Stay (HOSPITAL_COMMUNITY): Payer: Medicaid Other

## 2018-05-29 LAB — CBC
HCT: 32.1 % — ABNORMAL LOW (ref 39.0–52.0)
Hemoglobin: 10 g/dL — ABNORMAL LOW (ref 13.0–17.0)
MCH: 28 pg (ref 26.0–34.0)
MCHC: 31.2 g/dL (ref 30.0–36.0)
MCV: 89.9 fL (ref 80.0–100.0)
Platelets: 441 10*3/uL — ABNORMAL HIGH (ref 150–400)
RBC: 3.57 MIL/uL — ABNORMAL LOW (ref 4.22–5.81)
RDW: 15.8 % — ABNORMAL HIGH (ref 11.5–15.5)
WBC: 21.8 10*3/uL — ABNORMAL HIGH (ref 4.0–10.5)
nRBC: 0 % (ref 0.0–0.2)

## 2018-05-29 LAB — BASIC METABOLIC PANEL WITH GFR
Anion gap: 7 (ref 5–15)
CO2: 21 mmol/L — ABNORMAL LOW (ref 22–32)
Glucose, Bld: 103 mg/dL — ABNORMAL HIGH (ref 70–99)
Potassium: 3.9 mmol/L (ref 3.5–5.1)

## 2018-05-29 LAB — BASIC METABOLIC PANEL
BUN: 17 mg/dL (ref 6–20)
Calcium: 8.1 mg/dL — ABNORMAL LOW (ref 8.9–10.3)
Chloride: 102 mmol/L (ref 98–111)
Creatinine, Ser: 0.74 mg/dL (ref 0.61–1.24)
GFR calc Af Amer: >60 mL/min (ref >60)
GFR calc non Af Amer: >60 mL/min (ref >60)
Sodium: 130 mmol/L — ABNORMAL LOW (ref 135–145)

## 2018-05-29 LAB — PROCALCITONIN: Procalcitonin: 1.21 ng/mL

## 2018-05-29 MED ORDER — VANCOMYCIN HCL 10 G IV SOLR
1500.0000 mg | Freq: Once | INTRAVENOUS | Status: AC
  Administered 2018-05-29: 1500 mg via INTRAVENOUS
  Filled 2018-05-29: qty 1500

## 2018-05-29 MED ORDER — VANCOMYCIN HCL IN DEXTROSE 750-5 MG/150ML-% IV SOLN
750.0000 mg | Freq: Three times a day (TID) | INTRAVENOUS | Status: DC
  Administered 2018-05-29 – 2018-05-31 (×5): 750 mg via INTRAVENOUS
  Filled 2018-05-29 (×9): qty 150

## 2018-05-29 NOTE — Progress Notes (Signed)
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   Assessment & Plan:   Principal Problem:   Sepsis ([place]) Active Problems:   Laryngeal cancer ([place])   Metastasis of unknown origin ([place])   Hyponatremia   Diarrhea   Lactic acidosis   COPD with chronic bronchitis (HCC)   CKD (chronic kidney disease) stage 3, GFR 30-59 ml/min (HCC)   Elevated troponin   Normocytic anemia   Pressure injury of skin   #) Sepsis of unclear source: Fever has resolved.  And blood cultures are negative however his white count continues to remain persistently elevated and his procalcitonin is now rising despite cefepime and metronidazole.  Chest x-ray done today shows no clear evidence of pneumonia which she is been complaining of a cough. -Continue IV cefepime and metronidazole started 06/02/2018, -Start IV vancomycin 05/29/2018 - blood cultures ordered 05/19/2018 no growth to date -Repeat blood cultures ordered 05/29/2018 -Differential ordered on white blood cell count  #) Laryngeal cancer with new metastatic disease: Patient status post platinum-based chemotherapy and radiation.  His CT abdomen pelvis on admission showed evidence of diffuse metastatic disease in the liver, lymph nodes of the stomach, rib.  Suspect most likely metastatic laryngeal cancer. -Oncology consult  #) Hypotension: Patient has had episodes of hypotension overnight.  These appear to be symptomatic but they are likely positional release related to which of arm is checked.  #) Iron deficiency  anemia: -Continue oral iron daily - B12 normal - folate ending -Status post 2 units packed red blood cells on 05/27/2018, improved hemoglobin  #) Hypertension/hyperlipidemia: -Continue statin  #) Right lower extremity DVT: This was diagnosed several months ago. -Continue rivaroxaban  #) COPD: Currently not wheezing, not on any treatments at home.  #) Pain/psych: -Continue gabapentin 300 mg 3 times daily -Continue fluoxetine 60 mg daily  Fluids: Tolerating p.o. Letter lites: Monitor and supplement Nutrition: Regular diet  Prophylaxis: Treatment dose Lovenox  Disposition: Etiology of elevated white blood cell count and underlying infection  Full code   Consultants:   Oncology  Procedures:   None  Antimicrobials:   Continue IV cefepime and metronidazole started 06/11/2018   Subjective: This morning patient reports he is feeling about the same.  He continues to report a cough and severe throat pain after his radiation.  He denies any nausea, vomiting, abdominal pain, diarrhea.  He has not had any chest pain. Objective: Vitals:   05/28/18 2003 05/28/18 2137 05/29/18 0549 05/29/18 0755  BP: 100/64  116/84   Pulse: (!) 106 100 (!) 111   Resp: 18 18 18    Temp: 98.7 F (37.1 C)  99.4 F (37.4 C)   TempSrc: Oral  Oral   SpO2: 94% 94% 97% 96%  Weight:      Height:        Intake/Output Summary (Last 24 hours) at 05/29/2018 0846 Last data filed at 05/29/2018 7867 Gross per 24 hour  Intake 3460.32 ml  Output 600 ml  Net 2860.32 ml   Filed Weights   06/12/2018 1401 06/13/2018 2216  Weight: 68.9 kg 70.5 kg    Examination:  General exam: Appears calm and comfortable  Respiratory system: Clear to auscultation. Respiratory effort normal. Cardiovascular system: Regular rate and rhythm, no murmurs. Gastrointestinal system: Soft, nondistended, no rebound or guarding, plus bowel sounds Central nervous system: Alert and oriented.  Grossly intact, moving all  extremities Extremities: No lower extremity edema. Skin: Visible redness and erythema over neck Psychiatry: Judgement and insight appear normal. Mood & affect depressed.     Data Reviewed: I have personally reviewed following labs and imaging studies  CBC: Recent Labs  Lab 06/08/2018 1451 05/26/18 0506 05/27/18 0651 05/27/18 1810 05/27/18 2128 05/28/18 0637 05/29/18 0552  WBC 28.5* 28.2* 18.0*  --  16.6* 20.3* 21.8*  NEUTROABS 25.1*  --   --   --   --   --   --   HGB 8.1* 7.1* 6.8* 9.1* 8.9* 9.2* 10.0*  HCT 25.4* 22.5* 22.1* 28.6* 28.2* 29.4* 32.1*  MCV 86.1 87.9 89.8  --  88.1 89.4 89.9  PLT 522* 488* 462*  --  400 418* 093*   Basic Metabolic Panel: Recent Labs  Lab 05/19/2018 1451 05/26/18 0506 05/27/18 0651 05/28/18 0637 05/29/18 0552  NA 123* 125* 129* 130* 130*  K 3.9 3.7 3.5 3.5 3.9  CL 85* 94* 101 103 102  CO2 23 22 21* 21* 21*  GLUCOSE 103* 92 119* 107* 103*  BUN 59* 43* 24* 17 17  CREATININE 1.40* 0.95 0.76 0.66 0.74  CALCIUM 8.1* 7.3* 7.5* 7.6* 8.1*  MG  --   --  2.0  --   --    GFR: Estimated Creatinine Clearance: 95.2 mL/min (by C-G formula based on SCr of 0.74 mg/dL). Liver Function Tests: Recent Labs  Lab 05/29/2018 1451 05/26/18 0506  AST 57* 46*  ALT 24 19  ALKPHOS 151* 126  BILITOT 1.0 0.5  PROT 6.8 5.5*  ALBUMIN 2.1* 1.7*   Recent Labs  Lab 06/03/2018 1451  LIPASE 26   No results for input(s): AMMONIA in the last 168 hours. Coagulation Profile: No results for input(s): INR, PROTIME in the last 168 hours. Cardiac Enzymes: Recent Labs  Lab 05/29/2018 1451 05/31/2018 2338 05/26/18 0506 05/26/18 1239  TROPONINI 0.04* 0.04* 0.04* 0.04*   BNP (last 3 results) No results for input(s): PROBNP in the last 8760 hours. HbA1C: No results for input(s): HGBA1C in the last 72 hours. CBG: No results for input(s): GLUCAP in the last 168 hours. Lipid Profile: No results for input(s): CHOL, HDL, LDLCALC, TRIG, CHOLHDL, LDLDIRECT in the last 72  hours. Thyroid Function Tests: No results for input(s): TSH, T4TOTAL, FREET4, T3FREE, THYROIDAB in the last 72 hours. Anemia Panel: Recent Labs    05/28/18 0637  VITAMINB12 413  FERRITIN 1,892*  TIBC 135*  IRON 11*  RETICCTPCT 2.8   Sepsis Labs: Recent Labs  Lab 06/02/2018 2006 05/21/2018 2338 05/27/18 0651 05/27/18 2128 05/28/18 0031 05/28/18 0637 05/29/18 0552  PROCALCITON  --   --  0.83  --   --  0.78 1.21  LATICACIDVEN 1.8 1.6  --  1.2 1.0  --   --     Recent Results (from the past 240 hour(s))  Blood Culture (routine x 2)     Status: None (Preliminary result)   Collection Time: 06/09/2018  4:29 PM  Result Value Ref Range Status   Specimen Description BLOOD RIGHT WRIST  Final   Special Requests   Final    BOTTLES DRAWN AEROBIC AND ANAEROBIC Blood Culture adequate volume   Culture   Final    NO GROWTH 4 DAYS Performed at [facility]  [facility], [address], [place], [place] [address]    Report Status PENDING  Incomplete         Radiology Studies: Dg Chest Port 1 View  Result Date: 05/29/2018 CLINICAL DATA:  Fever, sepsis, diarrhea, weakness, abdominal pain and cough. History of laryngeal carcinoma. EXAM: PORTABLE CHEST 1 VIEW COMPARISON:  06/13/2018 FINDINGS: The heart size and mediastinal contours are within normal limits. Stable appearance of Port-A-Cath via left subclavian vein with catheter tip in SVC. Low lung volumes with bibasilar atelectasis. There is no evidence of pulmonary edema, consolidation, pneumothorax, nodule or pleural fluid. The visualized skeletal structures are unremarkable. IMPRESSION: Low lung volumes with bibasilar atelectasis. Electronically Signed   By: Aletta Edouard M.D.   On: 05/29/2018 08:19        Scheduled Meds: . sodium chloride   Intravenous Once  . ferrous sulfate  325 mg Oral Q breakfast  . FLUoxetine  60 mg Oral Daily  . gabapentin  300 mg Oral TID  . ipratropium-albuterol  3 mL Nebulization TID  . mometasone-formoterol  2 puff  Inhalation BID  . polyethylene glycol  17 g Oral Daily  . pravastatin  10 mg Oral q1800  . rivaroxaban  20 mg Oral Q supper   Continuous Infusions: . ceFEPime (MAXIPIME) IV Stopped (05/29/18 0224)  . metronidazole Stopped (05/29/18 0111)  . vancomycin     Followed by  . vancomycin       LOS: 4 days    Time spent: [place], MD [facility]  If 7PM-7AM, please contact night-coverage [email or web address] Password [facility] 05/29/2018, 8:46 AM

## 2018-05-29 NOTE — Progress Notes (Signed)
Pharmacy Antibiotic Note  Timothy Parks is a 57 y.o. male admitted on 05/24/2018 with sepsis.  Pharmacy has been consulted for Cefepime and Vancomy;cin dosing. WBC elevated, Procalcitonin increased today. Tmax 99.4, empiric Vancomycin added to regimen.  Plan: Vancomycin 1500mg  loading dose, then 750mg  IV q8h Increase cefepime 2gm IV q8h Also on Flagyl F/U cxs and clinical progress. Monitor V/S, labs  Height: 5\' 7"  (170.2 cm) Weight: 155 lb 6.8 oz (70.5 kg) IBW/kg (Calculated) : 66.1  Temp (24hrs), Avg:98.8 F (37.1 C), Min:98.2 F (36.8 C), Max:99.4 F (37.4 C)  Recent Labs  Lab 05/28/2018 1451 06/09/2018 1458 05/23/2018 2006 05/21/2018 2338 05/26/18 0506 05/27/18 0651 05/27/18 2128 05/28/18 0031 05/28/18 0637 05/29/18 0552  WBC 28.5*  --   --   --  28.2* 18.0* 16.6*  --  20.3* 21.8*  CREATININE 1.40*  --   --   --  0.95 0.76  --   --  0.66 0.74  LATICACIDVEN  --  2.68* 1.8 1.6  --   --  1.2 1.0  --   --     Estimated Creatinine Clearance: 95.2 mL/min (by C-G formula based on SCr of 0.74 mg/dL).    Allergies  Allergen Reactions  . Tetracyclines & Related Anxiety    Antimicrobials this admission:  Cefepime 11/7 >>  Flagyl 11/7 >>  Vancomycin 11/11>> Vancomycin and Zosyn 11/7 x 1  Dose adjustments this admission:  11/9 INcrease cefepime 2gm IV q8h  Microbiology results:  11/7 BCx: ngtd  Thank you for allowing pharmacy to be a part of this patient's care.  Isac Sarna, BS Pharm D, BCPS Clinical Pharmacist Pager [phone] 05/29/2018 8:20 AM

## 2018-05-29 NOTE — Plan of Care (Signed)

## 2018-05-30 LAB — COMPREHENSIVE METABOLIC PANEL
ALT: 19 U/L (ref 0–44)
AST: 48 U/L — ABNORMAL HIGH (ref 15–41)
Alkaline Phosphatase: 126 U/L (ref 38–126)
BUN: 16 mg/dL (ref 6–20)
Calcium: 7.8 mg/dL — ABNORMAL LOW (ref 8.9–10.3)
Chloride: 104 mmol/L (ref 98–111)
Creatinine, Ser: 0.79 mg/dL (ref 0.61–1.24)
GFR calc non Af Amer: >60 mL/min (ref >60)
Total Bilirubin: 0.7 mg/dL (ref 0.3–1.2)
Total Protein: 5.4 g/dL — ABNORMAL LOW (ref 6.5–8.1)

## 2018-05-30 LAB — CBC WITH DIFFERENTIAL/PLATELET
Abs Immature Granulocytes: 0.48 K/uL — ABNORMAL HIGH (ref 0.00–0.07)
Basophils Absolute: 0.1 K/uL (ref 0.0–0.1)
Basophils Relative: 0 %
Eosinophils Absolute: 0.4 10*3/uL (ref 0.0–0.5)
Eosinophils Relative: 3 %
HCT: 28.8 % — ABNORMAL LOW (ref 39.0–52.0)
Hemoglobin: 9 g/dL — ABNORMAL LOW (ref 13.0–17.0)
Immature Granulocytes: 3 %
Lymphocytes Relative: 6 %
Lymphs Abs: 0.9 K/uL (ref 0.7–4.0)
MCH: 28.8 pg (ref 26.0–34.0)
MCHC: 31.3 g/dL (ref 30.0–36.0)
MCV: 92.3 fL (ref 80.0–100.0)
Monocytes Absolute: 0.6 10*3/uL (ref 0.1–1.0)
Monocytes Relative: 3 %
Neutro Abs: 14.4 K/uL — ABNORMAL HIGH (ref 1.7–7.7)
Neutrophils Relative %: 85 %
Platelets: 398 K/uL (ref 150–400)
RBC: 3.12 MIL/uL — ABNORMAL LOW (ref 4.22–5.81)
RDW: 15.8 % — ABNORMAL HIGH (ref 11.5–15.5)
WBC: 16.9 10*3/uL — ABNORMAL HIGH (ref 4.0–10.5)
nRBC: 0 % (ref 0.0–0.2)

## 2018-05-30 LAB — FOLATE RBC
Folate, Hemolysate: 368.6 ng/mL
Folate, RBC: 1326 ng/mL (ref >498)
Hematocrit: 27.8 % — ABNORMAL LOW (ref 37.5–51.0)

## 2018-05-30 LAB — CULTURE, BLOOD (ROUTINE X 2)
CULTURE: NO GROWTH
SPECIAL REQUESTS: ADEQUATE

## 2018-05-30 LAB — COMPREHENSIVE METABOLIC PANEL WITH GFR
Albumin: 1.6 g/dL — ABNORMAL LOW (ref 3.5–5.0)
Anion gap: 5 (ref 5–15)
CO2: 21 mmol/L — ABNORMAL LOW (ref 22–32)
GFR calc Af Amer: >60 mL/min (ref >60)
Glucose, Bld: 103 mg/dL — ABNORMAL HIGH (ref 70–99)
Potassium: 3.6 mmol/L (ref 3.5–5.1)
Sodium: 130 mmol/L — ABNORMAL LOW (ref 135–145)

## 2018-05-30 LAB — PROCALCITONIN: PROCALCITONIN: 1.38 ng/mL

## 2018-05-30 LAB — MAGNESIUM: Magnesium: 1.9 mg/dL (ref 1.7–2.4)

## 2018-05-30 NOTE — Care Management (Signed)
CM discussed with patient him needing more care at home. We discussed HH vs PCS. Pt agreeable to [facility] at this time. Has no preference of providers but will have difficulty being contacted. Referral given to [facility], [facility] rep. Rep is aware it will be difficulty to get in touch with patient and has been asked to discuss plan for contact prior to DC.

## 2018-05-30 NOTE — Progress Notes (Signed)
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  Assessment & Plan:   Principal Problem:   Sepsis ([place]) Active Problems:   Laryngeal cancer ([place])   Metastasis of unknown origin ([place])   Hyponatremia   Diarrhea   Lactic acidosis   COPD with chronic bronchitis (HCC)   CKD (chronic kidney disease) stage 3, GFR 30-59 ml/min (HCC)   Elevated troponin   Normocytic anemia   Pressure injury of skin   Sepsis of unclear source: Fever has resolved.  And blood cultures are negative however his white count continues to remain persistently elevated and his procalcitonin is now rising despite cefepime and metronidazole.  Chest x-ray done today shows no clear evidence of pneumonia which she is been complaining of a cough. CT abdomen and pelvis with no occult infection. -Continue IV cefepime and metronidazole started 05/28/2018, -Start IV vancomycin 05/29/2018 - blood cultures ordered 05/22/2018 no growth to date -Repeat blood cultures ordered 05/29/2018 with no growth in 1 day -Added fungal blood culture today  Laryngeal cancer with new metastatic disease: Patient status post platinum-based chemotherapy and radiation.  His CT abdomen pelvis on admission showed evidence of diffuse metastatic disease in the liver, lymph nodes of the stomach, rib.  Suspect most likely metastatic laryngeal cancer. -Oncology consult pending today (Dr. Walden Field to see)  Iron deficiency anemia: -Continue oral iron daily - B12 normal - folate ending -Status post 2 units packed red blood cells  on 05/27/2018, improved hemoglobin  Hypertension/hyperlipidemia: -Continue statin  Right lower extremity DVT: This was diagnosed several months ago. -Continue rivaroxaban  COPD: Currently not wheezing, not on any treatments at home.  Pain/psych: -Continue gabapentin 300 mg 3 times daily -Continue fluoxetine 60 mg daily   DVT prophylaxis:Xarelto Code Status: Full Family Communication: None at bedside Disposition Plan: Oncology evaluation, continue to follow cultures and maintain on IV antibiotics for now and consider discontinuing in 1-2 days after full 7 day course is complete.   Consultants:   Oncology  Procedures:   None  Antimicrobials:   Cefepime and Flagyl on 11/7->  Vancomycin 11/11->   Subjective: Patient seen and evaluated today with no new acute complaints or concerns. No acute concerns or events noted overnight.  Objective: Vitals:   05/29/18 2300 05/30/18 0542 05/30/18 0756 05/30/18 1333  BP: 120/80 110/70  106/77  Pulse: (!) 102 99  (!) 101  Resp: 20 18  18   Temp: 98.7 F (37.1 C) 98.3 F (36.8 C)  98.2 F (36.8 C)  TempSrc: Oral Oral  Oral  SpO2: 95% 97% 97% 94%  Weight:      Height:        Intake/Output Summary (Last 24 hours) at 05/30/2018 1348 Last data filed at 05/30/2018 0900 Gross per 24 hour  Intake 1278.5 ml  Output 500 ml  Net 778.5 ml   Filed Weights   05/31/2018 1401 06/13/2018 2216  Weight: 68.9 kg 70.5 kg    Examination:  General exam: Appears calm and comfortable  Respiratory system: Clear to auscultation. Respiratory effort normal. Cardiovascular system: S1 & S2 heard, RRR. No JVD, murmurs, rubs, gallops or clicks. No pedal edema. Gastrointestinal system:  Abdomen is nondistended, soft and nontender. No organomegaly or masses felt. Normal bowel sounds heard. Central nervous system: Alert and oriented. No focal neurological deficits. Extremities: Symmetric 5 x 5 power. Skin: No rashes, lesions or ulcers Psychiatry:  Judgement and insight appear normal. Mood & affect appropriate.     Data Reviewed: I have personally reviewed following labs and imaging studies  CBC: Recent Labs  Lab 05/28/2018 1451  05/27/18 0651 05/27/18 1810 05/27/18 2128 05/28/18 0637 05/29/18 0552 05/30/18 0447  WBC 28.5*   < > 18.0*  --  16.6* 20.3* 21.8* 16.9*  NEUTROABS 25.1*  --   --   --   --   --   --  14.4*  HGB 8.1*   < > 6.8* 9.1* 8.9* 9.2* 10.0* 9.0*  HCT 25.4*   < > 22.1* 28.6* 28.2* 29.4* 32.1* 28.8*  MCV 86.1   < > 89.8  --  88.1 89.4 89.9 92.3  PLT 522*   < > 462*  --  400 418* 441* 398   < > = values in this interval not displayed.   Basic Metabolic Panel: Recent Labs  Lab 05/26/18 0506 05/27/18 0651 05/28/18 0637 05/29/18 0552 05/30/18 0447  NA 125* 129* 130* 130* 130*  K 3.7 3.5 3.5 3.9 3.6  CL 94* 101 103 102 104  CO2 22 21* 21* 21* 21*  GLUCOSE 92 119* 107* 103* 103*  BUN 43* 24* 17 17 16   CREATININE 0.95 0.76 0.66 0.74 0.79  CALCIUM 7.3* 7.5* 7.6* 8.1* 7.8*  MG  --  2.0  --   --  1.9   GFR: Estimated Creatinine Clearance: 95.2 mL/min (by C-G formula based on SCr of 0.79 mg/dL). Liver Function Tests: Recent Labs  Lab 05/24/2018 1451 05/26/18 0506 05/30/18 0447  AST 57* 46* 48*  ALT 24 19 19   ALKPHOS 151* 126 126  BILITOT 1.0 0.5 0.7  PROT 6.8 5.5* 5.4*  ALBUMIN 2.1* 1.7* 1.6*   Recent Labs  Lab 05/23/2018 1451  LIPASE 26   No results for input(s): AMMONIA in the last 168 hours. Coagulation Profile: No results for input(s): INR, PROTIME in the last 168 hours. Cardiac Enzymes: Recent Labs  Lab 06/12/2018 1451 06/17/2018 2338 05/26/18 0506 05/26/18 1239  TROPONINI 0.04* 0.04* 0.04* 0.04*   BNP (last 3 results) No results for input(s): PROBNP in the last 8760 hours. HbA1C: No results for input(s): HGBA1C in the last 72 hours. CBG: No results for input(s): GLUCAP in the last 168 hours. Lipid Profile: No results for input(s): CHOL, HDL, LDLCALC, TRIG, CHOLHDL, LDLDIRECT in the  last 72 hours. Thyroid Function Tests: No results for input(s): TSH, T4TOTAL, FREET4, T3FREE, THYROIDAB in the last 72 hours. Anemia Panel: Recent Labs    05/28/18 0637  VITAMINB12 413  FERRITIN 1,892*  TIBC 135*  IRON 11*  RETICCTPCT 2.8   Sepsis Labs: Recent Labs  Lab 06/03/2018 2006 06/09/2018 2338 05/27/18 0651 05/27/18 2128 05/28/18 0031 05/28/18 0637 05/29/18 0552  PROCALCITON  --   --  0.83  --   --  0.78 1.21  LATICACIDVEN 1.8 1.6  --  1.2 1.0  --   --     Recent Results (from the past 240 hour(s))  Blood Culture (routine x 2)     Status: None   Collection Time: 06/07/2018  4:29 PM  Result Value Ref Range Status   Specimen Description BLOOD RIGHT WRIST  Final   Special Requests   Final    BOTTLES DRAWN  AEROBIC AND ANAEROBIC Blood Culture adequate volume   Culture   Final    NO GROWTH 5 DAYS Performed at [facility], [address] [place], [place] [address]    Report Status 05/30/2018 FINAL  Final  Culture, blood (routine x 2)     Status: None (Preliminary result)   Collection Time: 05/29/18  9:12 AM  Result Value Ref Range Status   Specimen Description LEFT ANTECUBITAL  Final   Special Requests   Final    BOTTLES DRAWN AEROBIC AND ANAEROBIC Blood Culture adequate volume   Culture   Final    NO GROWTH 1 DAY Performed at [facility], [address], [place], [place] [address]    Report Status PENDING  Incomplete  Culture, blood (routine x 2)     Status: None (Preliminary result)   Collection Time: 05/29/18  9:12 AM  Result Value Ref Range Status   Specimen Description BLOOD LEFT HAND  Final   Special Requests   Final    BOTTLES DRAWN AEROBIC AND ANAEROBIC Blood Culture adequate volume   Culture   Final    NO GROWTH 1 DAY Performed at [facility], [address], [place], [place] [address]    Report Status PENDING  Incomplete         Radiology Studies: Dg Chest Port 1 View  Result Date: 05/29/2018 CLINICAL DATA:  Fever, sepsis, diarrhea,  weakness, abdominal pain and cough. History of laryngeal carcinoma. EXAM: PORTABLE CHEST 1 VIEW COMPARISON:  05/28/2018 FINDINGS: The heart size and mediastinal contours are within normal limits. Stable appearance of Port-A-Cath via left subclavian vein with catheter tip in SVC. Low lung volumes with bibasilar atelectasis. There is no evidence of pulmonary edema, consolidation, pneumothorax, nodule or pleural fluid. The visualized skeletal structures are unremarkable. IMPRESSION: Low lung volumes with bibasilar atelectasis. Electronically Signed   By: Aletta Edouard M.D.   On: 05/29/2018 08:19        Scheduled Meds: . sodium chloride   Intravenous Once  . ferrous sulfate  325 mg Oral Q breakfast  . FLUoxetine  60 mg Oral Daily  . gabapentin  300 mg Oral TID  . ipratropium-albuterol  3 mL Nebulization TID  . mometasone-formoterol  2 puff Inhalation BID  . polyethylene glycol  17 g Oral Daily  . pravastatin  10 mg Oral q1800  . rivaroxaban  20 mg Oral Q supper   Continuous Infusions: . ceFEPime (MAXIPIME) IV 2 g (05/30/18 0251)  . metronidazole 500 mg (05/30/18 0826)  . vancomycin 750 mg (05/30/18 1000)     LOS: 5 days    Time spent: 30 minutes    Delan Ksiazek Darleen Crocker, DO [facility] Pager [phone]  If 7PM-7AM, please contact night-coverage [email or web address] Password [email or web address] 05/30/2018, 1:48 PM

## 2018-05-30 NOTE — Progress Notes (Signed)
Patient states that he is very tired and ready for sleeping medication and not to be bothered.  Patients vitals taken early per request and medications given early per request.

## 2018-05-31 DIAGNOSIS — D649 Anemia, unspecified: Secondary | ICD-10-CM

## 2018-05-31 DIAGNOSIS — J449 Chronic obstructive pulmonary disease, unspecified: Secondary | ICD-10-CM

## 2018-05-31 DIAGNOSIS — C799 Secondary malignant neoplasm of unspecified site: Secondary | ICD-10-CM

## 2018-05-31 DIAGNOSIS — N183 Chronic kidney disease, stage 3 (moderate): Secondary | ICD-10-CM

## 2018-05-31 LAB — VANCOMYCIN, TROUGH: VANCOMYCIN TR: 29 ug/mL — AB (ref 15–20)

## 2018-05-31 LAB — PROCALCITONIN: PROCALCITONIN: 1.48 ng/mL

## 2018-05-31 MED ORDER — ALUM & MAG HYDROXIDE-SIMETH 200-200-20 MG/5ML PO SUSP
30.0000 mL | Freq: Once | ORAL | Status: AC
  Administered 2018-05-31: 30 mL via ORAL
  Filled 2018-05-31: qty 30

## 2018-05-31 MED ORDER — SODIUM CHLORIDE 0.9 % IV SOLN
INTRAVENOUS | Status: DC
  Administered 2018-05-31 – 2018-06-02 (×3): via INTRAVENOUS

## 2018-05-31 MED ORDER — VANCOMYCIN HCL IN DEXTROSE 750-5 MG/150ML-% IV SOLN
750.0000 mg | Freq: Two times a day (BID) | INTRAVENOUS | Status: DC
  Administered 2018-05-31 – 2018-06-01 (×2): 750 mg via INTRAVENOUS
  Filled 2018-05-31 (×4): qty 150

## 2018-05-31 MED ORDER — LIDOCAINE VISCOUS HCL 2 % MT SOLN
15.0000 mL | Freq: Once | OROMUCOSAL | Status: AC
  Administered 2018-05-31: 15 mL via ORAL
  Filled 2018-05-31: qty 15

## 2018-05-31 NOTE — Progress Notes (Signed)
Patient would like MD to contact Amy Hassell Done (sister) at [phone] with status updates please.

## 2018-05-31 NOTE — Progress Notes (Signed)
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  Assessment & Plan:   Principal Problem:   Sepsis ([place]) Active Problems:   Laryngeal cancer ([place])   Metastasis of unknown origin ([place])   Hyponatremia   Diarrhea   Lactic acidosis   COPD with chronic bronchitis (HCC)   CKD (chronic kidney disease) stage 3, GFR 30-59 ml/min (HCC)   Elevated troponin   Normocytic anemia   Pressure injury of skin   Sepsis of unclear source:  -Fever has resolved.   -WBC's finally started to trend down -will add IVF resuscitation -Chest x-ray w/o acute infiltrates -CT abdomen and pelvis with no occult infection. -Continue IV cefepime, vancomycin and flagyl -blood cultures has remained without growth  -Added fungal blood culture on 11/12; if positive will add antifungal therapy.  Laryngeal cancer with new metastatic disease: Patient status post platinum-based chemotherapy and radiation. -His CT abdomen pelvis on admission showed evidence of diffuse metastatic disease in the liver, lymph nodes of the stomach, rib.  Suspect most likely metastatic laryngeal cancer. -Oncology consult still pending today (Dr. Walden Field to see) -further treatment and intervention as per oncology service rec's  Iron deficiency anemia: -Continue oral iron daily - B12 normal - folate normal -Status post 2 units packed red blood cells on 05/27/2018 -no overt bleeding appreciated -follow Hgb trend   Hypertension/hyperlipidemia: -Continue statin  Right lower extremity DVT: This was diagnosed  several months ago. -Continue rivaroxaban -no signs of acute bleeding   COPD:  -Currently not wheezing, not on any treatments at home.  Pain/psych: -Continue gabapentin 300 mg 3 times daily -Continue fluoxetine 60 mg daily   DVT prophylaxis:Xarelto Code Status: Full Family Communication: mother at bedside Disposition Plan: Oncology evaluation pending, continue to follow cultures and maintain on IV antibiotics for now and consider discontinuing or transition to oral regimen in 1-2 days after full 7 day course is completed. Patient afebrile.   Consultants:   Oncology  Procedures:   None  Antimicrobials:   Cefepime and Flagyl on 11/7->  Vancomycin 11/11->   Subjective: No fever, denies CP and reports improvement in his breathing. Having burning sensation when swallowing and still with ongoing productive cough.  Objective: Vitals:   05/31/18 1351 05/31/18 1458 05/31/18 2017 05/31/18 2020  BP:  110/68    Pulse:  (!) 102    Resp:  17    Temp:  98.6 F (37 C)    TempSrc:  Oral    SpO2: 94% 94% 95% 95%  Weight:      Height:        Intake/Output Summary (Last 24 hours) at 05/31/2018 2133 Last data filed at 05/31/2018 1505 Gross per 24 hour  Intake 2212.12 ml  Output 500 ml  Net 1712.12 ml   Filed Weights   05/29/2018 1401 06/04/2018 2216  Weight: 68.9 kg 70.5 kg    Examination: General exam: Alert, awake, oriented x 3; patient denies CP and has remained afebrile. Reported ongoing productive cough and burning sensation when swallowing. Respiratory system: Clear to auscultation. Respiratory effort normal. Cardiovascular system:RRR. No murmurs, rubs, gallops. No JVD. Gastrointestinal system: Abdomen is nondistended, soft and nontender.  No organomegaly or masses felt. Normal bowel sounds heard. Central nervous system: Alert and oriented. No focal neurological deficits. Extremities: No C/C/E, +pedal pulses Skin: No rashes, lesions or ulcers Psychiatry: Judgement  and insight appear normal. Mood & affect appropriate.   Data Reviewed: I have personally reviewed following labs and imaging studies  CBC: Recent Labs  Lab 06/13/2018 1451  05/27/18 0651 05/27/18 1810 05/27/18 2128 05/28/18 0637 05/29/18 0552 05/30/18 0447  WBC 28.5*   < > 18.0*  --  16.6* 20.3* 21.8* 16.9*  NEUTROABS 25.1*  --   --   --   --   --   --  14.4*  HGB 8.1*   < > 6.8* 9.1* 8.9* 9.2* 10.0* 9.0*  HCT 25.4*   < > 22.1* 28.6* 28.2* 29.4*  27.8* 32.1* 28.8*  MCV 86.1   < > 89.8  --  88.1 89.4 89.9 92.3  PLT 522*   < > 462*  --  400 418* 441* 398   < > = values in this interval not displayed.   Basic Metabolic Panel: Recent Labs  Lab 05/26/18 0506 05/27/18 0651 05/28/18 0637 05/29/18 0552 05/30/18 0447  NA 125* 129* 130* 130* 130*  K 3.7 3.5 3.5 3.9 3.6  CL 94* 101 103 102 104  CO2 22 21* 21* 21* 21*  GLUCOSE 92 119* 107* 103* 103*  BUN 43* 24* 17 17 16   CREATININE 0.95 0.76 0.66 0.74 0.79  CALCIUM 7.3* 7.5* 7.6* 8.1* 7.8*  MG  --  2.0  --   --  1.9   GFR: Estimated Creatinine Clearance: 95.2 mL/min (by C-G formula based on SCr of 0.79 mg/dL).   Liver Function Tests: Recent Labs  Lab 05/23/2018 1451 05/26/18 0506 05/30/18 0447  AST 57* 46* 48*  ALT 24 19 19   ALKPHOS 151* 126 126  BILITOT 1.0 0.5 0.7  PROT 6.8 5.5* 5.4*  ALBUMIN 2.1* 1.7* 1.6*   Recent Labs  Lab 06/09/2018 1451  LIPASE 26   Cardiac Enzymes: Recent Labs  Lab 06/05/2018 1451 06/09/2018 2338 05/26/18 0506 05/26/18 1239  TROPONINI 0.04* 0.04* 0.04* 0.04*   Sepsis Labs: Recent Labs  Lab 05/22/2018 2006 06/17/2018 2338  05/27/18 2128 05/28/18 0031 05/28/18 0637 05/29/18 0552 05/30/18 1505 05/31/18 0848  PROCALCITON  --   --    < >  --   --  0.78 1.21 1.38 1.48  LATICACIDVEN 1.8 1.6  --  1.2 1.0  --   --   --   --    < > = values in this interval not displayed.    Recent Results (from the past 240 hour(s))  Blood Culture (routine x 2)     Status: None   Collection Time:  05/22/2018  4:29 PM  Result Value Ref Range Status   Specimen Description BLOOD RIGHT WRIST  Final   Special Requests   Final    BOTTLES DRAWN AEROBIC AND ANAEROBIC Blood Culture adequate volume   Culture   Final    NO GROWTH 5 DAYS Performed at [facility], [address] [place], [place] [address]    Report Status 05/30/2018 FINAL  Final  Culture, blood (routine x 2)     Status: None (Preliminary result)   Collection Time: 05/29/18  9:12 AM  Result Value Ref Range Status   Specimen Description LEFT ANTECUBITAL  Final   Special Requests   Final    BOTTLES DRAWN AEROBIC AND ANAEROBIC Blood Culture adequate volume   Culture  Final    NO GROWTH 2 DAYS Performed at [facility], [address] [place], [place] [address]    Report Status PENDING  Incomplete  Culture, blood (routine x 2)     Status: None (Preliminary result)   Collection Time: 05/29/18  9:12 AM  Result Value Ref Range Status   Specimen Description BLOOD LEFT HAND  Final   Special Requests   Final    BOTTLES DRAWN AEROBIC AND ANAEROBIC Blood Culture adequate volume   Culture   Final    NO GROWTH 2 DAYS Performed at [facility], [address] [place], [place] [address]    Report Status PENDING  Incomplete     Scheduled Meds: . sodium chloride   Intravenous Once  . alum & mag hydroxide-simeth  30 mL Oral Once   And  . lidocaine  15 mL Oral Once  . ferrous sulfate  325 mg Oral Q breakfast  . FLUoxetine  60 mg Oral Daily  . gabapentin  300 mg Oral TID  . ipratropium-albuterol  3 mL Nebulization TID  . mometasone-formoterol  2 puff Inhalation BID  . polyethylene glycol  17 g Oral Daily  . pravastatin  10 mg Oral q1800  . rivaroxaban  20 mg Oral Q supper   Continuous Infusions: . sodium chloride 100 mL/hr at 05/31/18 1505  . ceFEPime (MAXIPIME) IV 2 g (05/31/18 1831)  . metronidazole 500 mg (05/31/18 1658)  . vancomycin 750 mg (05/31/18 2035)     LOS: 6 days    Time spent: 30 minutes   Barton Dubois, MD [facility] Pager [phone]  If 7PM-7AM, please contact night-coverage [email or web address] Password [facility] 05/31/2018, 9:33 PM

## 2018-05-31 NOTE — Progress Notes (Signed)
Pharmacy Antibiotic Note  Timothy Parks is a 57 y.o. male admitted on 05/31/2018 with sepsis.  Pharmacy has been consulted for Cefepime and Vancomy;cin dosing. WBC elevated, Procalcitonin increased today. Tmax 99.4, empiric Vancomycin added to regimen.  Plan: Vanco trough: 29- will hold next dose and extend interval Decrease Vancomycin to 750 mg IV every 12 hours Continue cefepime 2gm IV q8h Flagyl 500 mg IV every 8 hours. Follow labs, c/s, and vanco trough as indicated.  Height: 5\' 7"  (170.2 cm) Weight: 155 lb 6.8 oz (70.5 kg) IBW/kg (Calculated) : 66.1  Temp (24hrs), Avg:98.6 F (37 C), Min:97.7 F (36.5 C), Max:99.9 F (37.7 C)  Recent Labs  Lab 05/30/2018 1458 06/01/2018 2006 06/14/2018 2338 05/26/18 0506 05/27/18 0651 05/27/18 2128 05/28/18 0031 05/28/18 3875 05/29/18 0552 05/30/18 0447 05/31/18 0848  WBC  --   --   --  28.2* 18.0* 16.6*  --  20.3* 21.8* 16.9*  --   CREATININE  --   --   --  0.95 0.76  --   --  0.66 0.74 0.79  --   LATICACIDVEN 2.68* 1.8 1.6  --   --  1.2 1.0  --   --   --   --   VANCOTROUGH  --   --   --   --   --   --   --   --   --   --  29*    Estimated Creatinine Clearance: 95.2 mL/min (by C-G formula based on SCr of 0.79 mg/dL).    Allergies  Allergen Reactions  . Tetracyclines & Related Anxiety    Antimicrobials this admission:  Cefepime 11/7 >>  Flagyl 11/7 >>  Vancomycin 11/11>> Vancomycin and Zosyn 11/7 x 1   Dose adjustments this admission:  11/9 Increase cefepime 2gm IV q8h  Microbiology results:  11/7 BCx: ng 11/11 BCx: ngtd 11/12 fungus culture: pending  Thank you for allowing pharmacy to be a part of this patient's care.  Margot Ables, PharmD Clinical Pharmacist 05/31/2018 11:05 AM

## 2018-05-31 NOTE — Progress Notes (Signed)
SLP Cancellation Note  Patient Details Name: Timothy Parks MRN: [id number] DOB: [birth year]   Cancelled treatment:       Reason Eval/Treat Not Completed: Fatigue/lethargy limiting ability to participate. Pt politely refused PO trials secondary to lethargy and not being "hungy", however Pt reports he continues to use dysphagia strategies discussed during evaluation and in previous ST treatment sessions. There are no further ST needs noted during acute stay. Please re-order ST services if of benefit in the future. Thank you for allowing [place] to participate in the care of this patient,  Almetta Lovely. Roddie Mc, CCC-SLP Speech Language Pathologist   Wende Bushy 05/31/2018, 1:49 PM

## 2018-06-01 ENCOUNTER — Inpatient Hospital Stay (HOSPITAL_COMMUNITY): Payer: Medicaid Other

## 2018-06-01 DIAGNOSIS — C329 Malignant neoplasm of larynx, unspecified: Secondary | ICD-10-CM

## 2018-06-01 DIAGNOSIS — C7951 Secondary malignant neoplasm of bone: Secondary | ICD-10-CM

## 2018-06-01 DIAGNOSIS — A419 Sepsis, unspecified organism: Principal | ICD-10-CM

## 2018-06-01 DIAGNOSIS — F17210 Nicotine dependence, cigarettes, uncomplicated: Secondary | ICD-10-CM

## 2018-06-01 DIAGNOSIS — C787 Secondary malignant neoplasm of liver and intrahepatic bile duct: Secondary | ICD-10-CM

## 2018-06-01 LAB — BASIC METABOLIC PANEL
Anion gap: 6 (ref 5–15)
BUN: 23 mg/dL — ABNORMAL HIGH (ref 6–20)
CHLORIDE: 105 mmol/L (ref 98–111)
CO2: 19 mmol/L — AB (ref 22–32)
CREATININE: 1.24 mg/dL (ref 0.61–1.24)
Calcium: 7.9 mg/dL — ABNORMAL LOW (ref 8.9–10.3)
GFR calc non Af Amer: >60 mL/min (ref >60)
Glucose, Bld: 99 mg/dL (ref 70–99)
Potassium: 3.6 mmol/L (ref 3.5–5.1)
Sodium: 130 mmol/L — ABNORMAL LOW (ref 135–145)

## 2018-06-01 LAB — CBC
HEMATOCRIT: 30.2 % — AB (ref 39.0–52.0)
HEMOGLOBIN: 9 g/dL — AB (ref 13.0–17.0)
MCH: 28.6 pg (ref 26.0–34.0)
MCHC: 29.8 g/dL — AB (ref 30.0–36.0)
MCV: 95.9 fL (ref 80.0–100.0)
Platelets: 375 10*3/uL (ref 150–400)
RBC: 3.15 MIL/uL — ABNORMAL LOW (ref 4.22–5.81)
RDW: 16 % — ABNORMAL HIGH (ref 11.5–15.5)
WBC: 17.2 10*3/uL — AB (ref 4.0–10.5)
nRBC: 0 % (ref 0.0–0.2)

## 2018-06-01 LAB — PROCALCITONIN: Procalcitonin: 1.52 ng/mL

## 2018-06-01 MED ORDER — AMOXICILLIN-POT CLAVULANATE 875-125 MG PO TABS
1.0000 | ORAL_TABLET | Freq: Two times a day (BID) | ORAL | Status: AC
  Administered 2018-06-01 – 2018-06-05 (×8): 1 via ORAL
  Filled 2018-06-01 (×8): qty 1

## 2018-06-01 MED ORDER — GUAIFENESIN-DM 100-10 MG/5ML PO SYRP
5.0000 mL | ORAL_SOLUTION | ORAL | Status: DC | PRN
  Administered 2018-06-06: 5 mL via ORAL
  Filled 2018-06-01 (×3): qty 5

## 2018-06-01 MED ORDER — IOPAMIDOL (ISOVUE-300) INJECTION 61%
125.0000 mL | Freq: Once | INTRAVENOUS | Status: AC | PRN
  Administered 2018-06-01: 125 mL via INTRAVENOUS

## 2018-06-01 NOTE — Progress Notes (Signed)
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  Assessment & Plan:   Principal Problem:   Sepsis ([place]) Active Problems:   Laryngeal cancer ([place])   Metastasis of unknown origin ([place])   Hyponatremia   Diarrhea   Lactic acidosis   COPD with chronic bronchitis (HCC)   CKD (chronic kidney disease) stage 3, GFR 30-59 ml/min (HCC)   Elevated troponin   Normocytic anemia   Pressure injury of skin   Sepsis of unclear source:  -Fever has resolved.   -WBC's trending down -Continue gentle fluid resuscitation. -Chest x-ray w/o acute infiltrates -CT abdomen and pelvis with no occult infection. -Patient has remained afebrile and at this moment antibiotics will be transitioned to oral regimen.  Plan is for 3 more days of Augmentin. -blood cultures has remained without growth  -Added fungal blood culture on 11/12; if positive will add antifungal therapy.  Laryngeal cancer with new metastatic disease: Patient status post platinum-based chemotherapy and radiation. -His CT abdomen pelvis on admission showed evidence of diffuse metastatic disease in the liver, lymph nodes of the stomach, rib.  Suspect most likely metastatic laryngeal cancer. -Oncology consult appreciated.  Will complete staging work-up with CT chest and CT soft tissue of his neck.  Recommendations given to pursued biopsy of new lesions (either liver or ribs) to further determine treatment.  Iron deficiency anemia: -Continue oral iron daily - B12 normal - folate normal -Status post 2 units packed  red blood cells on 05/27/2018 -no overt bleeding appreciated -follow Hgb trend with repeat CBC in a.m.  Hypertension/hyperlipidemia: -Continue statins  Right lower extremity DVT: This was diagnosed several months ago. -Continue rivaroxaban -no signs of acute bleeding   COPD:  -Currently not wheezing, not on any treatments at home. -Continue DuoNeb  Pain/psych: -Continue gabapentin 300 mg 3 times daily -Continue fluoxetine 60 mg daily   DVT prophylaxis:Xarelto Code Status: Full Family Communication: mother at bedside Disposition Plan: Oncology evaluation appreciated; will complete staging work-up with a CT chest and CT soft tissue of his neck; antibiotics will be transitioned to oral.  Physical therapy evaluation will be requested.   Consultants:   Oncology  Procedures:   None  Antimicrobials:   Cefepime and Flagyl on 11/7->11/14  Vancomycin 11/11->11/14  augmentin 11/14 (with intentions to treat for 3 more days).   Subjective: Per nursing staff was having generalized pain earlier which improved with pain medication.  Currently resting comfortable.  Patient denies chest pain and reports breathing improving.  Still with some mild productive cough.  Objective: Vitals:   06/01/18 0448 06/01/18 0800 06/01/18 0805 06/01/18 1300  BP: 135/80   129/71  Pulse: 99   75  Resp: 18   18  Temp: 98.4 F (36.9 C)   98.6 F (37 C)  TempSrc: Oral   Oral  SpO2: 95% 90% 94% 95%  Weight:      Height:        Intake/Output Summary (Last 24 hours) at 06/01/2018 1707 Last data filed at 06/01/2018 1505 Gross per 24 hour  Intake 3581.96 ml  Output -  Net 3581.96 ml   Filed Weights   05/24/2018 1401  06/03/2018 2216  Weight: 68.9 kg 70.5 kg    Examination: General exam: Alert, awake, oriented x 3; reports breathing is improving.  Denies chest pain.  Patient has remained afebrile. Respiratory system: Scattered rhonchi, no wheezing, no using accessory  muscles. Cardiovascular system:RRR. No murmurs, rubs, gallops.  No JVD Gastrointestinal system: Abdomen is nondistended, soft and nontender. No organomegaly or masses felt. Normal bowel sounds heard. Central nervous system: Alert and oriented. No focal neurological deficits. Extremities: No C/C/E, +pedal pulses Skin: No rashes, lesions or ulcers Psychiatry: Judgement and insight appear normal. Mood & affect appropriate.    Data Reviewed: I have personally reviewed following labs and imaging studies  CBC: Recent Labs  Lab 05/27/18 2128 05/28/18 0637 05/29/18 0552 05/30/18 0447 06/01/18 0607  WBC 16.6* 20.3* 21.8* 16.9* 17.2*  NEUTROABS  --   --   --  14.4*  --   HGB 8.9* 9.2* 10.0* 9.0* 9.0*  HCT 28.2* 29.4*  27.8* 32.1* 28.8* 30.2*  MCV 88.1 89.4 89.9 92.3 95.9  PLT 400 418* 441* 398 494   Basic Metabolic Panel: Recent Labs  Lab 05/27/18 0651 05/28/18 0637 05/29/18 0552 05/30/18 0447 06/01/18 0607  NA 129* 130* 130* 130* 130*  K 3.5 3.5 3.9 3.6 3.6  CL 101 103 102 104 105  CO2 21* 21* 21* 21* 19*  GLUCOSE 119* 107* 103* 103* 99  BUN 24* 17 17 16  23*  CREATININE 0.76 0.66 0.74 0.79 1.24  CALCIUM 7.5* 7.6* 8.1* 7.8* 7.9*  MG 2.0  --   --  1.9  --    GFR: Estimated Creatinine Clearance: 61.5 mL/min (by C-G formula based on SCr of 1.24 mg/dL).   Liver Function Tests: Recent Labs  Lab 05/26/18 0506 05/30/18 0447  AST 46* 48*  ALT 19 19  ALKPHOS 126 126  BILITOT 0.5 0.7  PROT 5.5* 5.4*  ALBUMIN 1.7* 1.6*   Cardiac Enzymes: Recent Labs  Lab 06/08/2018 2338 05/26/18 0506 05/26/18 1239  TROPONINI 0.04* 0.04* 0.04*   Sepsis Labs: Recent Labs  Lab 06/09/2018 2006 05/23/2018 2338  05/27/18 2128 05/28/18 0031  05/29/18 0552 05/30/18 1505 05/31/18 0848 06/01/18 0607  PROCALCITON  --   --    < >  --   --    < > 1.21 1.38 1.48 1.52  LATICACIDVEN 1.8 1.6  --  1.2 1.0  --   --   --   --   --    < > = values in this interval not displayed.    Recent Results  (from the past 240 hour(s))  Blood Culture (routine x 2)     Status: None   Collection Time: 06/03/2018  4:29 PM  Result Value Ref Range Status   Specimen Description BLOOD RIGHT WRIST  Final   Special Requests   Final    BOTTLES DRAWN AEROBIC AND ANAEROBIC Blood Culture adequate volume   Culture   Final    NO GROWTH 5 DAYS Performed at [facility], [address] [place], [place] [address]    Report Status 05/30/2018 FINAL  Final  Culture, blood (routine x 2)     Status: None (Preliminary result)   Collection Time: 05/29/18  9:12 AM  Result Value Ref Range Status   Specimen Description LEFT ANTECUBITAL  Final   Special Requests   Final    BOTTLES DRAWN AEROBIC AND ANAEROBIC Blood Culture adequate volume   Culture   Final    NO GROWTH 3 DAYS Performed at [facility], [address] [place], [place] [address]    Report Status PENDING  Incomplete  Culture, blood (routine x 2)     Status: None (Preliminary result)   Collection Time: 05/29/18  9:12 AM  Result Value Ref Range Status   Specimen Description BLOOD LEFT HAND  Final   Special Requests   Final    BOTTLES DRAWN AEROBIC AND ANAEROBIC Blood Culture adequate volume   Culture   Final    NO GROWTH 3 DAYS Performed at [facility], [address], [place], [place] [address]    Report Status PENDING  Incomplete     Scheduled Meds: . sodium chloride   Intravenous Once  . amoxicillin-clavulanate  1 tablet Oral Q12H  . ferrous sulfate  325 mg Oral Q breakfast  . FLUoxetine  60 mg Oral Daily  . gabapentin  300 mg Oral TID  . ipratropium-albuterol  3 mL Nebulization TID  . mometasone-formoterol  2 puff Inhalation BID  . polyethylene glycol  17 g Oral Daily  . pravastatin  10 mg Oral q1800  . rivaroxaban  20 mg Oral Q supper   Continuous Infusions: . sodium chloride 100 mL/hr at 06/01/18 0839     LOS: 7 days    Time spent: 30 minutes   Barton Dubois, MD [facility] Pager [phone]  If 7PM-7AM, please  contact night-coverage [email or web address] Password [email or web address] 06/01/2018, 5:07 PM

## 2018-06-01 NOTE — Plan of Care (Signed)

## 2018-06-01 NOTE — Consult Note (Signed)
[facility] Consultation Oncology  Name: Timothy Parks      MRN: [id number]    Location: A327/A327-01  Date: 06/01/2018 Time:4:51 PM   REFERRING PHYSICIAN: Dr. Dyann Kief  REASON FOR CONSULT: Metastatic cancer to the liver and ribs   DIAGNOSIS: Metastatic squamous cell carcinoma, pending biopsy  HISTORY OF PRESENT ILLNESS: Timothy Parks is a 57 year old white male who is seen in consultation today for further work-up and management of possible metastatic squamous cell carcinoma of the head and neck region.  He was diagnosed with stage IVa laryngeal cancer, earlier this year and was treated with weekly cisplatin and radiation therapy from 10/25/2017 until 12/06/2017.  He was last seen in our clinic on 12/06/2017 and was lost to follow-up.  He received radiation therapy in the [place].  He was recently admitted to the hospital on 05/23/2018 with sepsis of unclear etiology.  CT scan of the abdomen and pelvis dated 06/14/2018 shows multiple poorly defined hypodense liver masses consistent with metastatic disease.  Interval increase in retroperitoneal and upper abdominal lymphadenopathy.  Hazy density within the mesentery and omentum.  3.8 cm soft tissue mass surrounding the left 10th rib and additional expansile rib mass in the left eighth rib concerning for metastatic disease.  He does report pain in his chest wall when he is coughing.  He also has diffuse abdominal pain.  He reportedly lives by himself at home.  PAST MEDICAL HISTORY:   Past Medical History:  Diagnosis Date  . Anxiety   . Cancer ([place])    laryngeal  . COPD (chronic obstructive pulmonary disease) ([place])    pt states he was told he has COPD by health department  . Hypertension   . Recovering alcoholic (HCC)     ALLERGIES: Allergies  Allergen Reactions  . Tetracyclines & Related Anxiety      MEDICATIONS: I have reviewed the patient's current medications.     PAST SURGICAL HISTORY Past Surgical History:  Procedure  Laterality Date  . BLADDER SURGERY    . MICROLARYNGOSCOPY N/A 09/20/2017   Procedure: MICRO LARYNGOSCOPY WITH BIOPSY OF LARYNGEAL MASS;  Surgeon: Leta Baptist, MD;  Location: [place];  Service: ENT;  Laterality: N/A;  . PORTACATH PLACEMENT Left 10/21/2017   Procedure: INSERTION PORT-A-CATH;  Surgeon: Virl Cagey, MD;  Location: [facility];  Service: General;  Laterality: Left;    FAMILY HISTORY: Family History  Problem Relation Age of Onset  . Bladder Cancer Mother        39  . Colon cancer Father   . Thyroid disease Sister   . Heart disease Brother        stent placement  . Heart disease Maternal Grandmother   . Diabetes Maternal Grandfather   . Cancer Paternal Grandmother   . AAA (abdominal aortic aneurysm) Paternal Grandfather     SOCIAL HISTORY:  reports that he has been smoking cigarettes. He has been smoking about 0.00 packs per day. His smokeless tobacco use includes snuff. He reports that he does not drink alcohol or use drugs.  PERFORMANCE STATUS: The patient's performance status is 3 - Symptomatic, >50% confined to bed  PHYSICAL EXAM: Most Recent Vital Signs: Blood pressure 129/71, pulse 75, temperature 98.6 F (37 C), temperature source Oral, resp. rate 18, height 5' 7"  (1.702 m), weight 155 lb 6.8 oz (70.5 kg), SpO2 95 %. BP 129/71 (BP Location: Left Arm)   Pulse 75   Temp 98.6 F (37 C) (Oral)   Resp 18  Ht 5' 7"  (1.702 m)   Wt 155 lb 6.8 oz (70.5 kg)   SpO2 95%   BMI 24.34 kg/m  General appearance: alert and cooperative Neck: supple, symmetrical, trachea midline, thyroid not enlarged, symmetric, no tenderness/mass/nodules and There is a lymph node at the angle of the jaw on the left side, soft and freely mobile.  This measures approximately 3 to 4 cm in size. Lungs: clear to auscultation bilaterally Chest wall: no tenderness, left sided chest wall tenderness Heart: regular rate and rhythm Abdomen: abnormal findings:  distended and  Nonspecific tenderness present. Extremities: extremities normal, atraumatic, no cyanosis or edema and Left upper extremity is swollen. Skin: Skin color, texture, turgor normal. No rashes or lesions Lymph nodes: Cervical adenopathy: Left neck adenopathy present. Neurologic: Grossly normal  LABORATORY DATA:  Results for orders placed or performed during the hospital encounter of 05/23/2018 (from the past 48 hour(s))  Procalcitonin     Status: None   Collection Time: 05/31/18  8:48 AM  Result Value Ref Range   Procalcitonin 1.48 ng/mL    Comment:        Interpretation: PCT > 0.5 ng/mL and <= 2 ng/mL: Systemic infection (sepsis) is possible, but other conditions are known to elevate PCT as well. (NOTE)       Sepsis PCT Algorithm           Lower Respiratory Tract                                      Infection PCT Algorithm    ----------------------------     ----------------------------         PCT < 0.25 ng/mL                PCT < 0.10 ng/mL         Strongly encourage             Strongly discourage   discontinuation of antibiotics    initiation of antibiotics    ----------------------------     -----------------------------       PCT 0.25 - 0.50 ng/mL            PCT 0.10 - 0.25 ng/mL               OR       >80% decrease in PCT            Discourage initiation of                                            antibiotics      Encourage discontinuation           of antibiotics    ----------------------------     -----------------------------         PCT >= 0.50 ng/mL              PCT 0.26 - 0.50 ng/mL                AND       <80% decrease in PCT             Encourage initiation of  antibiotics       Encourage continuation           of antibiotics    ----------------------------     -----------------------------        PCT >= 0.50 ng/mL                  PCT > 0.50 ng/mL               AND         increase in PCT                  Strongly  encourage                                      initiation of antibiotics    Strongly encourage escalation           of antibiotics                                     -----------------------------                                           PCT <= 0.25 ng/mL                                                 OR                                        > 80% decrease in PCT                                     Discontinue / Do not initiate                                             antibiotics Performed at [facility], [address], [place], [place] [address]   Vancomycin, trough     Status: Abnormal   Collection Time: 05/31/18  8:48 AM  Result Value Ref Range   Vancomycin Tr 29 (HH) 15 - 20 ug/mL    Comment: CRITICAL RESULT CALLED TO, READ BACK BY AND VERIFIED WITH: BULLINS,L AT 9:25AM ON 05/31/18 BY [facility] Performed at [facility], [address] [place], [place] [address]   CBC     Status: Abnormal   Collection Time: 06/01/18  6:07 AM  Result Value Ref Range   WBC 17.2 (H) 4.0 - 10.5 K/uL   RBC 3.15 (L) 4.22 - 5.81 MIL/uL   Hemoglobin 9.0 (L) 13.0 - 17.0 g/dL   HCT 30.2 (L) 39.0 - 52.0 %   MCV 95.9 80.0 - 100.0 fL   MCH 28.6 26.0 - 34.0 pg   MCHC 29.8 (L) 30.0 - 36.0 g/dL   RDW 16.0 (H) 11.5 - 15.5 %   Platelets 375  150 - 400 K/uL   nRBC 0.0 0.0 - 0.2 %    Comment: Performed at [facility], [address], [place], [place] [address]  Basic metabolic panel     Status: Abnormal   Collection Time: 06/01/18  6:07 AM  Result Value Ref Range   Sodium 130 (L) 135 - 145 mmol/L   Potassium 3.6 3.5 - 5.1 mmol/L   Chloride 105 98 - 111 mmol/L   CO2 19 (L) 22 - 32 mmol/L   Glucose, Bld 99 70 - 99 mg/dL   BUN 23 (H) 6 - 20 mg/dL   Creatinine, Ser 1.24 0.61 - 1.24 mg/dL   Calcium 7.9 (L) 8.9 - 10.3 mg/dL   GFR calc non Af Amer >60 >60 mL/min   GFR calc Af Amer >60 >60 mL/min    Comment: (NOTE) The eGFR has been calculated using the CKD EPI equation. This calculation has not been  validated in all clinical situations. eGFR's persistently <60 mL/min signify possible Chronic Kidney Disease.    Anion gap 6 5 - 15    Comment: Performed at [facility], [address], [place], [place] [address]  Procalcitonin     Status: None   Collection Time: 06/01/18  6:07 AM  Result Value Ref Range   Procalcitonin 1.52 ng/mL    Comment:        Interpretation: PCT > 0.5 ng/mL and <= 2 ng/mL: Systemic infection (sepsis) is possible, but other conditions are known to elevate PCT as well. (NOTE)       Sepsis PCT Algorithm           Lower Respiratory Tract                                      Infection PCT Algorithm    ----------------------------     ----------------------------         PCT < 0.25 ng/mL                PCT < 0.10 ng/mL         Strongly encourage             Strongly discourage   discontinuation of antibiotics    initiation of antibiotics    ----------------------------     -----------------------------       PCT 0.25 - 0.50 ng/mL            PCT 0.10 - 0.25 ng/mL               OR       >80% decrease in PCT            Discourage initiation of                                            antibiotics      Encourage discontinuation           of antibiotics    ----------------------------     -----------------------------         PCT >= 0.50 ng/mL              PCT 0.26 - 0.50 ng/mL                AND       <80% decrease in PCT  Encourage initiation of                                             antibiotics       Encourage continuation           of antibiotics    ----------------------------     -----------------------------        PCT >= 0.50 ng/mL                  PCT > 0.50 ng/mL               AND         increase in PCT                  Strongly encourage                                      initiation of antibiotics    Strongly encourage escalation           of antibiotics                                     -----------------------------                                            PCT <= 0.25 ng/mL                                                 OR                                        > 80% decrease in PCT                                     Discontinue / Do not initiate                                             antibiotics Performed at [facility], [address] [place], [place] [address]       RADIOGRAPHY: US Venous Img Upper Uni Left  Result Date: 06/01/2018 CLINICAL DATA:  Left upper extremity pain and edema. History of prior DVT. Patient Is currently on coagulation. Evaluate for DVT. EXAM: LEFT UPPER EXTREMITY VENOUS DOPPLER ULTRASOUND TECHNIQUE: Gray-scale sonography with graded compression, as well as color Doppler and duplex ultrasound were performed to evaluate the upper extremity deep venous system from the level of the subclavian vein and including the jugular, axillary, basilic, radial, ulnar and upper cephalic vein. Spectral Doppler was utilized to evaluate flow at rest and with distal augmentation maneuvers. COMPARISON:  None. FINDINGS: Contralateral Subclavian Vein: Respiratory phasicity is normal and  symmetric with the symptomatic side. No evidence of thrombus. Normal compressibility. Internal Jugular Vein: No evidence of thrombus. Normal compressibility, respiratory phasicity and response to augmentation. Subclavian Vein: No evidence of thrombus. Normal compressibility, respiratory phasicity and response to augmentation. Axillary Vein: No evidence of thrombus. Normal compressibility, respiratory phasicity and response to augmentation. Cephalic Vein: No evidence of thrombus. Normal compressibility, respiratory phasicity and response to augmentation. Basilic Vein: No evidence of thrombus. Normal compressibility, respiratory phasicity and response to augmentation. Brachial Veins: No evidence of thrombus. Normal compressibility, respiratory phasicity and response to augmentation. Radial Veins: No evidence of thrombus. Normal  compressibility, respiratory phasicity and response to augmentation. Ulnar Veins: No evidence of thrombus. Normal compressibility, respiratory phasicity and response to augmentation. Venous Reflux:  None visualized. Other Findings: There is a minimal to moderate amount of subcutaneous edema about the left upper extremity. IMPRESSION: No evidence of acute or chronic DVT within the left upper extremity. Electronically Signed   By: Sandi Mariscal M.D.   On: 06/01/2018 15:35        ASSESSMENT and pLAN:  1.  Metastatic cancer: -This patient was treated for stage IVa laryngeal carcinoma from 10/25/2017 through 12/06/2017 with combination chemoradiation therapy. - He was subsequently lost to follow-up. -He presented with sepsis of unknown etiology.  He has palpable left neck lymphadenopathy. -CT scan on 06/15/2018 shows multiple hypodense liver masses consistent with metastatic disease with retroperitoneal and upper abdominal lymph nodes.  There is an expansile left 10th rib mass measuring 3.8 cm and additional left eighth rib mass concerning for metastatic disease. -I have recommended doing a CT of the neck and chest to complete the work-up. -I have also recommended either biopsying the rib mass or hypodense liver mass to establish a diagnosis of his metastatic disease. - I have talked to Dr. Carmelina Noun about my plan.  I think the patient is too weak to go home by himself.  He will likely need placement at a rehab facility.  All questions were answered. The patient knows to call the clinic with any problems, questions or concerns. We can certainly see the patient much sooner if necessary.   Derek Jack

## 2018-06-02 LAB — BASIC METABOLIC PANEL
Anion gap: 7 (ref 5–15)
BUN: 27 mg/dL — AB (ref 6–20)
CO2: 19 mmol/L — ABNORMAL LOW (ref 22–32)
Calcium: 7.8 mg/dL — ABNORMAL LOW (ref 8.9–10.3)
Chloride: 107 mmol/L (ref 98–111)
Creatinine, Ser: 1.55 mg/dL — ABNORMAL HIGH (ref 0.61–1.24)
GFR calc Af Amer: 56 mL/min — ABNORMAL LOW (ref >60)
GFR, EST NON AFRICAN AMERICAN: 48 mL/min — AB (ref >60)
Glucose, Bld: 94 mg/dL (ref 70–99)
POTASSIUM: 3.8 mmol/L (ref 3.5–5.1)
SODIUM: 133 mmol/L — AB (ref 135–145)

## 2018-06-02 LAB — CBC
HCT: 27.1 % — ABNORMAL LOW (ref 39.0–52.0)
Hemoglobin: 8.3 g/dL — ABNORMAL LOW (ref 13.0–17.0)
MCH: 27.9 pg (ref 26.0–34.0)
MCHC: 30.6 g/dL (ref 30.0–36.0)
MCV: 91.2 fL (ref 80.0–100.0)
PLATELETS: 399 10*3/uL (ref 150–400)
RBC: 2.97 MIL/uL — AB (ref 4.22–5.81)
RDW: 16.2 % — ABNORMAL HIGH (ref 11.5–15.5)
WBC: 17.9 10*3/uL — AB (ref 4.0–10.5)
nRBC: 0 % (ref 0.0–0.2)

## 2018-06-02 MED ORDER — SODIUM CHLORIDE 0.9 % IV SOLN
INTRAVENOUS | Status: AC
  Administered 2018-06-02: 21:00:00 via INTRAVENOUS

## 2018-06-02 NOTE — Plan of Care (Signed)
  Problem: Acute Rehab PT Goals(only PT should resolve) Goal: Pt Will Go Supine/Side To Sit Outcome: Progressing Flowsheets (Taken 06/02/2018 1406) Pt will go Supine/Side to Sit: with modified independence Goal: Patient Will Transfer Sit To/From Stand Outcome: Progressing Flowsheets (Taken 06/02/2018 1406) Patient will transfer sit to/from stand: with min guard assist Goal: Pt Will Transfer Bed To Chair/Chair To Bed Outcome: Progressing Flowsheets (Taken 06/02/2018 1406) Pt will Transfer Bed to Chair/Chair to Bed: min guard assist Goal: Pt Will Ambulate Outcome: Progressing Flowsheets (Taken 06/02/2018 1406) Pt will Ambulate: 50 feet; with min guard assist; with rolling walker; with cane   2:07 PM, 06/02/18 Lonell Grandchild, MPT Physical Therapist with [facility] [phone] office [phone] mobile phone

## 2018-06-02 NOTE — Clinical Social Work Placement (Signed)
   CLINICAL SOCIAL WORK PLACEMENT  NOTE  Date:  06/02/2018  Patient Details  Name: Timothy Parks MRN: [id number] Date of Birth: Nov 21, 1960  Clinical Social Work is seeking post-discharge placement for this patient at the [place] level of care (*CSW will initial, date and re-position this form in  chart as items are completed):  Yes   Patient/family provided with [facility] Work Department's list of facilities offering this level of care within the geographic area requested by the patient (or if unable, by the patient's family).  Yes   Patient/family informed of their freedom to choose among providers that offer the needed level of care, that participate in Medicare, Medicaid or managed care program needed by the patient, have an available bed and are willing to accept the patient.  Yes   Patient/family informed of [facility]'s ownership interest in [facility] and [facility], as well as of the fact that they are under no obligation to receive care at these facilities.  PASRR submitted to EDS on 06/02/18     PASRR number received on 06/02/18     Existing PASRR number confirmed on       FL2 transmitted to all facilities in geographic area requested by pt/family on 06/02/18     FL2 transmitted to all facilities within larger geographic area on       Patient informed that his/her managed care company has contracts with or will negotiate with certain facilities, including the following:            Patient/family informed of bed offers received.  Patient chooses bed at       Physician recommends and patient chooses bed at      Patient to be transferred to   on  .  Patient to be transferred to facility by       Patient family notified on   of transfer.  Name of family member notified:        PHYSICIAN       Additional Comment: Pt agreed to referral to [name], [name] and [name].[name]  has  already responded in the [place]. Pt is wanting to wait to hear from [name] as his mother lives in [place] and she could visit easily. [name] is reviewing. Pt is hoping for a private room. CSW stated we would ask.    _______________________________________________ Trish Mage, LCSW 06/02/2018, 3:46 PM

## 2018-06-02 NOTE — Evaluation (Signed)
Physical Therapy Evaluation Patient Details Name: Timothy Parks MRN: [id number] DOB: October 07, 1960 Today's Date: 06/02/2018   History of Present Illness  Timothy Parks is a 57 y.o. 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    Clinical Impression  Patient demonstrates labored movement for sitting up at bedside, very unsteady with near fall during sit to stands and taking steps with cane, required use of RW for safety, limited for ambulation due to c/o fatigue and tolerated sitting up in chair after therapy.  Patient will benefit from continued physical therapy in hospital and recommended venue below to increase strength, balance, endurance for safe ADLs and gait.    Follow Up Recommendations SNF    Equipment Recommendations  None recommended by PT    Recommendations for Other Services       Precautions / Restrictions Precautions Precautions: Fall Restrictions Weight Bearing Restrictions: No      Mobility  Bed Mobility Overal bed mobility: Needs Assistance Bed Mobility: Supine to Sit     Supine to sit: Min guard     General bed mobility comments: slow labord movement  Transfers Overall transfer level: Needs assistance Equipment used: Straight cane;Rolling walker (2 wheeled) Transfers: Sit to/from [facility] Sit to Stand: Min assist Stand pivot transfers: Min assist       General transfer comment: unsteady with near loss of balance using SPC, required use of RW for safety  Ambulation/Gait Ambulation/Gait assistance: Min assist Gait Distance (Feet): 30 Feet Assistive device: Rolling walker (2 wheeled) Gait Pattern/deviations: Decreased step length - right;Decreased step length - left;Decreased stride length Gait velocity: decreased   General Gait  Details: slow slightly labored cadence, limited mostly due to fatigue  Stairs            Wheelchair Mobility    Modified Rankin (Stroke Patients Only)       Balance Overall balance assessment: Needs assistance Sitting-balance support: Feet supported;No upper extremity supported Sitting balance-Leahy Scale: Good     Standing balance support: During functional activity;Single extremity supported Standing balance-Leahy Scale: Poor Standing balance comment: fair using RW                             Pertinent Vitals/Pain Pain Assessment: 0-10 Pain Score: 8  Pain Location: right side rib cage Pain Descriptors / Indicators: Aching;Discomfort Pain Intervention(s): Limited activity within patient's tolerance;Monitored during session    Brenda expects to be discharged to:: Private residence Living Arrangements: Alone Available Help at Discharge: Family Type of Home: House Home Access: Stairs to enter Entrance Stairs-Rails: Right;Left(to wide to reach both) Technical brewer of Steps: 3 Home Layout: One level Home Equipment: Environmental consultant - 2 wheels;Cane - single point;Shower seat - built in      Prior Function Level of Independence: Independent with assistive device(s)         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 [facility]     Hand Dominance        Extremity/Trunk Assessment   Upper Extremity Assessment Upper Extremity Assessment: Generalized weakness    Lower Extremity Assessment Lower Extremity Assessment: Generalized weakness    Cervical / Trunk Assessment Cervical / Trunk Assessment: Normal  Communication   Communication: No difficulties  Cognition Arousal/Alertness: Awake/alert Behavior During Therapy: WFL for tasks assessed/performed  Overall Cognitive Status: Within Functional Limits for tasks assessed                                        General Comments      Exercises      Assessment/Plan    PT Assessment Patient needs continued PT services  PT Problem List Decreased strength;Decreased activity tolerance;Decreased balance;Decreased mobility       PT Treatment Interventions Gait training;Stair training;Functional mobility training;Therapeutic activities;Therapeutic exercise;Patient/family education    PT Goals (Current goals can be found in the Care Plan section)  Acute Rehab PT Goals Patient Stated Goal: return home after rehab PT Goal Formulation: With patient/family Time For Goal Achievement: 06/16/18 Potential to Achieve Goals: Good    Frequency Min 3X/week   Barriers to discharge        Co-evaluation               AM-PAC PT "6 Clicks" Daily Activity  Outcome Measure Difficulty turning over in bed (including adjusting bedclothes, sheets and blankets)?: None Difficulty moving from lying on back to sitting on the side of the bed? : A Little Difficulty sitting down on and standing up from a chair with arms (e.g., wheelchair, bedside commode, etc,.)?: Unable Help needed moving to and from a bed to chair (including a wheelchair)?: A Little Help needed walking in hospital room?: A Little Help needed climbing 3-5 steps with a railing? : A Lot 6 Click Score: 16    End of Session Equipment Utilized During Treatment: Gait belt Activity Tolerance: Patient tolerated treatment well;Patient limited by fatigue Patient left: in chair;with call bell/phone within reach Nurse Communication: Mobility status PT Visit Diagnosis: Unsteadiness on feet (R26.81);Other abnormalities of gait and mobility (R26.89);Muscle weakness (generalized) (M62.81)    Time: 1224-8250 PT Time Calculation (min) (ACUTE ONLY): 35 min   Charges:   PT Evaluation $PT Eval Moderate Complexity: 1 Mod PT Treatments $Therapeutic Activity: 23-37 mins        2:04 PM, 06/02/18 Lonell Grandchild, MPT Physical Therapist with [facility] [phone]  office [phone] mobile phone

## 2018-06-02 NOTE — [place] FL2 (Signed)
[place] LEVEL OF CARE SCREENING TOOL     IDENTIFICATION  Patient Name: Timothy Parks Birthdate: 1960/12/03 Sex: male Admission Date (Current Location): 06/03/2018  [place] and [place] Number:  [facility] [id number] [place] and Address:  [place] [address], [place]      Provider Number: [id number]  Attending Physician Name and Address:  Barton Dubois, MD  Relative Name and Phone Number:  Geraldo Pitter (mother) [phone]    Current Level of Care: Hospital Recommended Level of Care: [place] Prior Approval Number:    Date Approved/Denied:   PASRR Number:    Discharge Plan: SNF    Current Diagnoses: Patient Active Problem List   Diagnosis Date Noted  . Pressure injury of skin 05/27/2018  . Sepsis ([place]) 06/05/2018  . Metastasis of unknown origin ([place]) 06/09/2018  . Hyponatremia 05/26/2018  . Diarrhea 05/31/2018  . Lactic acidosis 05/22/2018  . COPD with chronic bronchitis ([place]) 06/01/2018  . CKD (chronic kidney disease) stage 3, GFR 30-59 ml/min (HCC) 05/30/2018  . Elevated troponin 05/23/2018  . Normocytic anemia 05/24/2018  . Laryngeal cancer ([place]) 09/29/2017  . Goals of care, counseling/discussion 09/29/2017    Orientation RESPIRATION BLADDER Height & Weight     Self, Time, Situation, Place  Normal Continent Weight: 155 lb 6.8 oz (70.5 kg) Height:  5\' 7"  (170.2 cm)  BEHAVIORAL SYMPTOMS/MOOD NEUROLOGICAL BOWEL NUTRITION STATUS      Continent Diet(see dc summary)  AMBULATORY STATUS COMMUNICATION OF NEEDS Skin   Extensive Assist Verbally Normal                       Personal Care Assistance Level of Assistance  Bathing, Feeding, Dressing Bathing Assistance: Limited assistance Feeding assistance: Independent Dressing Assistance: Limited assistance     Functional Limitations Info  Sight, Hearing, Speech Sight Info: Adequate Hearing Info: Adequate Speech Info: Adequate     SPECIAL CARE FACTORS FREQUENCY  PT (By licensed PT)     PT Frequency: 5 times week              Contractures Contractures Info: Not present    Additional Factors Info  Code Status, Allergies, Psychotropic Code Status Info: full Allergies Info: Tetracyclines and related Psychotropic Info: Prozac         Current Medications (06/02/2018):  This is the current hospital active medication list Current Facility-Administered Medications  Medication Dose Route Frequency Provider Last Rate Last Dose  . 0.9 %  sodium chloride infusion (Manually program via Guardrails IV Fluids)   Intravenous Once Purohit, Shrey C, MD      . 0.9 %  sodium chloride infusion   Intravenous Continuous Barton Dubois, MD 75 mL/hr at 06/02/18 0550    . amoxicillin-clavulanate (AUGMENTIN) 875-125 MG per tablet 1 tablet  1 tablet Oral Q12H Barton Dubois, MD   1 tablet at 06/02/18 0909  . diphenhydrAMINE (BENADRYL) capsule 25 mg  25 mg Oral QHS PRN Purohit, Konrad Dolores, MD   25 mg at 05/27/18 2137  . ferrous sulfate tablet 325 mg  325 mg Oral Q breakfast Purohit, Konrad Dolores, MD   325 mg at 06/02/18 0908  . FLUoxetine (PROZAC) capsule 60 mg  60 mg Oral Daily Purohit, Shrey C, MD   60 mg at 06/02/18 0908  . gabapentin (NEURONTIN) capsule 300 mg  300 mg Oral TID Wendee Beavers T, MD   300 mg at 06/02/18 0908  . guaiFENesin-dextromethorphan (ROBITUSSIN DM) 100-10 MG/5ML syrup  5 mL  5 mL Oral Q4H PRN Barton Dubois, MD      . ipratropium-albuterol (DUONEB) 0.5-2.5 (3) MG/3ML nebulizer solution 3 mL  3 mL Nebulization TID Wendee Beavers T, MD   3 mL at 06/02/18 1418  . mometasone-formoterol (DULERA) 200-5 MCG/ACT inhaler 2 puff  2 puff Inhalation BID Mercy Riding, MD   2 puff at 06/02/18 0747  . morphine 4 MG/ML injection 4 mg  4 mg Intravenous Q4H PRN Wendee Beavers T, MD   4 mg at 06/02/18 0920  . ondansetron (ZOFRAN) tablet 4 mg  4 mg Oral Q6H PRN Wendee Beavers T, MD       Or  . ondansetron (ZOFRAN) injection 4 mg  4 mg  Intravenous Q6H PRN Wendee Beavers T, MD   4 mg at 05/28/18 0540  . polyethylene glycol (MIRALAX / GLYCOLAX) packet 17 g  17 g Oral Daily Wendee Beavers T, MD   17 g at 06/02/18 0908  . pravastatin (PRAVACHOL) tablet 10 mg  10 mg Oral q1800 Purohit, Shrey C, MD   10 mg at 06/01/18 1733  . rivaroxaban (XARELTO) tablet 20 mg  20 mg Oral Q supper Purohit, Shrey C, MD   20 mg at 06/01/18 1733  . zolpidem (AMBIEN) tablet 5 mg  5 mg Oral QHS PRN Purohit, Konrad Dolores, MD   5 mg at 05/31/18 2031     Discharge Medications: Please see discharge summary for a list of discharge medications.  Relevant Imaging Results:  Relevant Lab Results:   Additional Information SSN: [id number]  Shade Flood, LCSW

## 2018-06-02 NOTE — Progress Notes (Signed)
PROGRESS NOTE    Timothy Parks  PPJ:[id number] DOB: [birth year] DOA: 05/28/2018 PCP: Ginger Organ   Brief Narrative:  57 year old with past medical history relevant for laryngeal cancer status post platinum based chemotherapy and radiation, DVT on rivaroxaban, COPD, hypertension, hyperlipidemia, chronic pain, anxiety/depression who presents with fever, abdominal pain, diarrhea found to have sepsis of unclear etiology and new metastases of unclear etiology.  Assessment & Plan:   Principal Problem:   Sepsis ([place]) Active Problems:   Laryngeal cancer ([place])   Metastasis of unknown origin ([place])   Hyponatremia   Diarrhea   Lactic acidosis   COPD with chronic bronchitis (HCC)   CKD (chronic kidney disease) stage 3, GFR 30-59 ml/min (HCC)   Elevated troponin   Normocytic anemia   Pressure injury of skin   Sepsis due to PNA -Fever has resolved.   -WBC's trending down -Continue gentle fluid resuscitation. -Chest x-ray w/o acute infiltrates -CT abdomen and pelvis with no occult infection. -Patient has remained afebrile and at this moment antibiotics will be transitioned to oral regimen.  Plan is for 3-4 more days of Augmentin. -blood cultures has remained without growth  -staging CT of chest capturing airspace disease that suggested PNA.  Laryngeal cancer with new metastatic disease: Patient status post platinum-based chemotherapy and radiation. -His CT abdomen pelvis on admission showed evidence of diffuse metastatic disease in the liver, lymph nodes of the stomach, rib.  Suspect most likely metastatic laryngeal cancer. -Oncology consult appreciated.   -images of his chest and soft tissue neck with concerns for recurrence malignancy and metastasis. -biopsy for tissue identification and confirmation needed -IR consulted to assist with US liver biopsy as an outpatient.  Iron deficiency anemia: -Continue oral iron daily - B12 normal - folate normal -Status post  2 units packed red blood cells on 05/27/2018 -no overt bleeding appreciated -follow Hgb trend with repeat CBC in a.m.  Hypertension/hyperlipidemia: -Continue statins  Right lower extremity DVT: This was diagnosed several months ago. -Continue rivaroxaban -no signs of acute bleeding   COPD:  -Currently not wheezing, not on any treatments at home. -Continue DuoNeb  Pain/psych: -Continue gabapentin 300 mg 3 times daily -Continue fluoxetine 60 mg daily   DVT prophylaxis: Xarelto Code Status: Full Family Communication: mother at bedside Disposition Plan: Oncology evaluation appreciated; CT scan of the chest demonstrated some lymph node involvement and once again BEACOPP liver masses and metastatic lesions on his ribs.  Soft tissue neck CT scan demonstrating post radiation changes and some left lymph nodes.  Physical therapy evaluation completed and recommending a skilled nursing facility for further care and rehab.  Social worker has been made aware and will assist with discharge.  Patient afebrile and reporting improvement in swallowing.   Consultants:   Oncology  Procedures:   None  Antimicrobials:   Cefepime and Flagyl on 11/7->11/14  Vancomycin 11/11->11/14  augmentin 11/14 (with intentions to treat for 3 more days).   Subjective: Overall breathing better, reports feeling weak and deconditioned.  No chest pain, no nausea, no vomiting.  Still complaining of productive cough.  No requiring oxygen supplementation at this time.  Objective: Vitals:   06/01/18 2140 06/02/18 0637 06/02/18 0744 06/02/18 0747  BP: 120/70 122/68    Pulse: (!) 103 (!) 101    Resp:      Temp: 98.3 F (36.8 C) 98.2 F (36.8 C)    TempSrc: Oral Oral    SpO2: 94% 92% 91% 94%  Weight:  Height:        Intake/Output Summary (Last 24 hours) at 06/02/2018 1417 Last data filed at 06/02/2018 1100 Gross per 24 hour  Intake 2389.8 ml  Output 350 ml  Net 2039.8 ml   Filed Weights    05/21/2018 1401 06/02/2018 2216  Weight: 68.9 kg 70.5 kg    Examination: General exam: Alert, awake, oriented x 3; feeling weak, no CP, no abd pain. Reports some improvement in his swallowing discomfort. Still with productive cough. Good O2 sat on RA. Respiratory system: Positive rhonchi, no wheezing, no crackles.  No using accessory muscles. Respiratory effort normal. Cardiovascular system:RRR. No murmurs, rubs, gallops. Gastrointestinal system: Abdomen is nondistended, soft and nontender. No organomegaly or masses felt. Normal bowel sounds heard. Central nervous system: Alert and oriented. No focal neurological deficits. Extremities: No C/C/E, +pedal pulses Skin: No rashes, lesions or ulcers Psychiatry: Judgement and insight appear normal. Mood & affect appropriate.    Data Reviewed: I have personally reviewed following labs and imaging studies  CBC: Recent Labs  Lab 05/28/18 0637 05/29/18 0552 05/30/18 0447 06/01/18 0607 06/02/18 0747  WBC 20.3* 21.8* 16.9* 17.2* 17.9*  NEUTROABS  --   --  14.4*  --   --   HGB 9.2* 10.0* 9.0* 9.0* 8.3*  HCT 29.4*  27.8* 32.1* 28.8* 30.2* 27.1*  MCV 89.4 89.9 92.3 95.9 91.2  PLT 418* 441* 398 375 332   Basic Metabolic Panel: Recent Labs  Lab 05/27/18 0651 05/28/18 0637 05/29/18 0552 05/30/18 0447 06/01/18 0607 06/02/18 0747  NA 129* 130* 130* 130* 130* 133*  K 3.5 3.5 3.9 3.6 3.6 3.8  CL 101 103 102 104 105 107  CO2 21* 21* 21* 21* 19* 19*  GLUCOSE 119* 107* 103* 103* 99 94  BUN 24* 17 17 16  23* 27*  CREATININE 0.76 0.66 0.74 0.79 1.24 1.55*  CALCIUM 7.5* 7.6* 8.1* 7.8* 7.9* 7.8*  MG 2.0  --   --  1.9  --   --    GFR: Estimated Creatinine Clearance: 49.2 mL/min (A) (by C-G formula based on SCr of 1.55 mg/dL (H)).   Liver Function Tests: Recent Labs  Lab 05/30/18 0447  AST 48*  ALT 19  ALKPHOS 126  BILITOT 0.7  PROT 5.4*  ALBUMIN 1.6*   Sepsis Labs: Recent Labs  Lab 05/27/18 2128 05/28/18 0031  05/29/18 0552  05/30/18 1505 05/31/18 0848 06/01/18 0607  PROCALCITON  --   --    < > 1.21 1.38 1.48 1.52  LATICACIDVEN 1.2 1.0  --   --   --   --   --    < > = values in this interval not displayed.    Recent Results (from the past 240 hour(s))  Blood Culture (routine x 2)     Status: None   Collection Time: 05/20/2018  4:29 PM  Result Value Ref Range Status   Specimen Description BLOOD RIGHT WRIST  Final   Special Requests   Final    BOTTLES DRAWN AEROBIC AND ANAEROBIC Blood Culture adequate volume   Culture   Final    NO GROWTH 5 DAYS Performed at [facility], [address] [place], [place] [address]    Report Status 05/30/2018 FINAL  Final  Culture, blood (routine x 2)     Status: None (Preliminary result)   Collection Time: 05/29/18  9:12 AM  Result Value Ref Range Status   Specimen Description LEFT ANTECUBITAL  Final   Special Requests   Final  BOTTLES DRAWN AEROBIC AND ANAEROBIC Blood Culture adequate volume   Culture   Final    NO GROWTH 4 DAYS Performed at [facility], [address], [place], [place] [address]    Report Status PENDING  Incomplete  Culture, blood (routine x 2)     Status: None (Preliminary result)   Collection Time: 05/29/18  9:12 AM  Result Value Ref Range Status   Specimen Description BLOOD LEFT HAND  Final   Special Requests   Final    BOTTLES DRAWN AEROBIC AND ANAEROBIC Blood Culture adequate volume   Culture   Final    NO GROWTH 4 DAYS Performed at [facility], [address] [place], [place] [address]    Report Status PENDING  Incomplete     Scheduled Meds: . sodium chloride   Intravenous Once  . amoxicillin-clavulanate  1 tablet Oral Q12H  . ferrous sulfate  325 mg Oral Q breakfast  . FLUoxetine  60 mg Oral Daily  . gabapentin  300 mg Oral TID  . ipratropium-albuterol  3 mL Nebulization TID  . mometasone-formoterol  2 puff Inhalation BID  . polyethylene glycol  17 g Oral Daily  . pravastatin  10 mg Oral q1800  . rivaroxaban  20 mg Oral Q  supper   Continuous Infusions: . sodium chloride 75 mL/hr at 06/02/18 0550     LOS: 8 days    Time spent: 25 minutes   Barton Dubois, MD [facility] Pager [phone]  If 7PM-7AM, please contact night-coverage [email or web address] Password [email or web address] 06/02/2018, 2:17 PM

## 2018-06-03 LAB — CULTURE, BLOOD (ROUTINE X 2)
Culture: NO GROWTH
Culture: NO GROWTH
Special Requests: ADEQUATE
Special Requests: ADEQUATE

## 2018-06-03 LAB — CREATININE, SERUM
CREATININE: 1.85 mg/dL — AB (ref 0.61–1.24)
GFR calc Af Amer: 45 mL/min — ABNORMAL LOW (ref >60)
GFR, EST NON AFRICAN AMERICAN: 39 mL/min — AB (ref >60)

## 2018-06-03 MED ORDER — METOPROLOL TARTRATE 25 MG PO TABS
12.5000 mg | ORAL_TABLET | Freq: Two times a day (BID) | ORAL | Status: DC
  Administered 2018-06-03 – 2018-06-06 (×7): 12.5 mg via ORAL
  Filled 2018-06-03 (×8): qty 1

## 2018-06-03 MED ORDER — DIPHENHYDRAMINE HCL 25 MG PO CAPS: 25.0000 mg | ORAL_CAPSULE | Freq: Every evening | ORAL | Status: DC | PRN

## 2018-06-03 NOTE — Progress Notes (Signed)
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  Assessment & Plan:   Principal Problem:   Sepsis ([place]) Active Problems:   Laryngeal cancer ([place])   Metastasis of unknown origin ([place])   Hyponatremia   Diarrhea   Lactic acidosis   COPD with chronic bronchitis (HCC)   CKD (chronic kidney disease) stage 3, GFR 30-59 ml/min (HCC)   Elevated troponin   Normocytic anemia   Pressure injury of skin   Sepsis due to PNA -Fever has resolved.   -WBC's trending down -Continue gentle fluid resuscitation. -Chest x-ray w/o acute infiltrates -CT abdomen and pelvis with no occult infection. -Patient has remained afebrile and at this moment antibiotics will be transitioned to oral regimen.  Plan is for 2 more days of Augmentin. -blood cultures has remained without growth  -staging CT of chest capturing airspace disease that suggested PNA.  Laryngeal cancer with new metastatic disease: Patient status post platinum-based chemotherapy and radiation. -His CT abdomen pelvis on admission showed evidence of diffuse metastatic disease in the liver, lymph nodes of the stomach, rib.  Suspect most likely metastatic laryngeal cancer. -Oncology consult appreciated.   -images of his chest and soft tissue neck with concerns for recurrence malignancy and metastasis. -biopsy for tissue identification and confirmation needed -IR consulted to assist with US liver biopsy as an outpatient.  Iron deficiency anemia: -Continue oral iron daily - B12 normal - folate normal -Status post 2  units packed red blood cells on 05/27/2018 -no overt bleeding appreciated -follow Hgb trend with repeat CBC in a.m.  Hypertension/hyperlipidemia: -Continue statins  Right lower extremity DVT: This was diagnosed several months ago. -Continue rivaroxaban -no signs of acute bleeding   COPD:  -Currently not wheezing, not on any treatments at home. -Continue DuoNeb  Pain/psych: -Continue gabapentin 300 mg 3 times daily -Continue fluoxetine 60 mg daily  Left upper extremity swelling -Ultrasound negative for DVT -Will keep limb elevated.   DVT prophylaxis: Xarelto Code Status: Full Family Communication: mother at bedside Disposition Plan: Oncology evaluation appreciated; CT scan of the chest demonstrated some lymph node involvement and once again BEACOPP liver masses and metastatic lesions on his ribs.  Soft tissue neck CT scan demonstrating post radiation changes and some left lymph nodes.  Physical therapy evaluation completed and recommending a skilled nursing facility for further care and rehab.  Social worker has been made aware and will assist with discharge.  Patient afebrile and reporting improvement in swallowing.   Consultants:   Oncology  Procedures:   None  Antimicrobials:   Cefepime and Flagyl on 11/7->11/14  Vancomycin 11/11->11/14  augmentin 11/14 (with intentions to treat for 3 more days).   Subjective: Breathing is overall much improved, denies chest pain and expressed feeling weak and tired.  Still with mild discomfort when swallowing, but denies nausea, vomiting, abdominal pain or any other complaints.  Objective: Vitals:   06/03/18 0430 06/03/18 0750 06/03/18 1403 06/03/18 1413  BP: 115/90   118/70  Pulse: (!) 150   98  Resp: 20   20  Temp: 98.5 F (36.9 C)   98.6 F (37 C)  TempSrc: Oral   Oral  SpO2: 93% 94% (!) 86% 94%  Weight:      Height:        Intake/Output Summary (Last 24 hours) at 06/03/2018 1707 Last data filed at 06/03/2018  1414 Gross per 24 hour  Intake 360 ml  Output 900 ml  Net -540 ml   Filed Weights   06/07/2018 1401 06/04/2018 2216  Weight: 68.9 kg 70.5 kg    Examination: General exam: Alert, awake, oriented x 3; still with mild discomfort when swallowing.  No chest pain, reports improvement on his coughing spells and express breathing easier.  No nausea, no vomiting. Respiratory system: Positive rhonchi on examination; otherwise clear to auscultation. Respiratory effort normal. Cardiovascular system:RRR. No murmurs, rubs, gallops. Gastrointestinal system: Abdomen is nondistended, soft and nontender. No organomegaly or masses felt. Normal bowel sounds heard. Central nervous system: Alert and oriented. No focal neurological deficits. Extremities: No cyanosis or clubbing.  Left upper extremity peripheral swelling appreciated. Skin: No rashes, lesions or ulcers Psychiatry: Judgement and insight appear normal. Mood & affect appropriate.    Data Reviewed: I have personally reviewed following labs and imaging studies  CBC: Recent Labs  Lab 05/28/18 0637 05/29/18 0552 05/30/18 0447 06/01/18 0607 06/02/18 0747  WBC 20.3* 21.8* 16.9* 17.2* 17.9*  NEUTROABS  --   --  14.4*  --   --   HGB 9.2* 10.0* 9.0* 9.0* 8.3*  HCT 29.4*  27.8* 32.1* 28.8* 30.2* 27.1*  MCV 89.4 89.9 92.3 95.9 91.2  PLT 418* 441* 398 375 712   Basic Metabolic Panel: Recent Labs  Lab 05/28/18 0637 05/29/18 0552 05/30/18 0447 06/01/18 0607 06/02/18 0747 06/03/18 0628  NA 130* 130* 130* 130* 133*  --   K 3.5 3.9 3.6 3.6 3.8  --   CL 103 102 104 105 107  --   CO2 21* 21* 21* 19* 19*  --   GLUCOSE 107* 103* 103* 99 94  --   BUN 17 17 16  23* 27*  --   CREATININE 0.66 0.74 0.79 1.24 1.55* 1.85*  CALCIUM 7.6* 8.1* 7.8* 7.9* 7.8*  --   MG  --   --  1.9  --   --   --    GFR: Estimated Creatinine Clearance: 41.2 mL/min (A) (by C-G formula based on SCr of 1.85 mg/dL (H)).   Liver Function Tests: Recent Labs  Lab  05/30/18 0447  AST 48*  ALT 19  ALKPHOS 126  BILITOT 0.7  PROT 5.4*  ALBUMIN 1.6*   Sepsis Labs: Recent Labs  Lab 05/27/18 2128 05/28/18 0031  05/29/18 0552 05/30/18 1505 05/31/18 0848 06/01/18 0607  PROCALCITON  --   --    < > 1.21 1.38 1.48 1.52  LATICACIDVEN 1.2 1.0  --   --   --   --   --    < > = values in this interval not displayed.    Recent Results (from the past 240 hour(s))  Blood Culture (routine x 2)     Status: None   Collection Time: 06/16/2018  4:29 PM  Result Value Ref Range Status   Specimen Description BLOOD RIGHT WRIST  Final   Special Requests   Final    BOTTLES DRAWN AEROBIC AND ANAEROBIC Blood Culture adequate volume   Culture   Final    NO GROWTH 5 DAYS Performed at [facility], [address], [place], [place] [address]    Report Status 05/30/2018 FINAL  Final  Culture, blood (routine x 2)  Status: None   Collection Time: 05/29/18  9:12 AM  Result Value Ref Range Status   Specimen Description LEFT ANTECUBITAL  Final   Special Requests   Final    BOTTLES DRAWN AEROBIC AND ANAEROBIC Blood Culture adequate volume   Culture   Final    NO GROWTH 5 DAYS Performed at [facility], [address], [place], [place] [address]    Report Status 06/03/2018 FINAL  Final  Culture, blood (routine x 2)     Status: None   Collection Time: 05/29/18  9:12 AM  Result Value Ref Range Status   Specimen Description BLOOD LEFT HAND  Final   Special Requests   Final    BOTTLES DRAWN AEROBIC AND ANAEROBIC Blood Culture adequate volume   Culture   Final    NO GROWTH 5 DAYS Performed at [facility], [address], [place], [place] [address]    Report Status 06/03/2018 FINAL  Final     Scheduled Meds: . sodium chloride   Intravenous Once  . amoxicillin-clavulanate  1 tablet Oral Q12H  . ferrous sulfate  325 mg Oral Q breakfast  . FLUoxetine  60 mg Oral Daily  . gabapentin  300 mg Oral TID  . ipratropium-albuterol  3 mL Nebulization TID  . metoprolol  tartrate  12.5 mg Oral BID  . mometasone-formoterol  2 puff Inhalation BID  . polyethylene glycol  17 g Oral Daily  . pravastatin  10 mg Oral q1800  . rivaroxaban  20 mg Oral Q supper   Continuous Infusions:    LOS: 9 days    Time spent: 25 minutes   Barton Dubois, MD [facility] Pager [phone]  If 7PM-7AM, please contact night-coverage [email or web address] Password [email or web address] 06/03/2018, 5:07 PM

## 2018-06-03 NOTE — Plan of Care (Signed)
  Problem: Elimination: Goal: Will not experience complications related to urinary retention Outcome: Progressing   Problem: Safety: Goal: Ability to remain free from injury will improve Outcome: Progressing   Problem: Health Behavior/Discharge Planning: Goal: Ability to manage health-related needs will improve Outcome: Not Progressing   Problem: Skin Integrity: Goal: Risk for impaired skin integrity will decrease Outcome: Not Progressing

## 2018-06-03 NOTE — Progress Notes (Signed)
Patient very sleepy at treatment time. Saturation 89 on 2lpm oxygen increased to 4 liters . Dulera not given

## 2018-06-03 NOTE — Progress Notes (Signed)
Sat on side of bed for about 10 minutes and assisted to stand with walker to use urinal. Sacral dressing changed.  Patient stated that there were ants on his bedspread and has had several other visual hallucinations yesterday and today.  Contacted Dr. Dyann Kief concerning hallucinations just now.

## 2018-06-04 MED ORDER — MORPHINE SULFATE (PF) 2 MG/ML IV SOLN
2.0000 mg | Freq: Four times a day (QID) | INTRAVENOUS | Status: DC | PRN
  Administered 2018-06-04 – 2018-06-06 (×5): 2 mg via INTRAVENOUS
  Filled 2018-06-04 (×5): qty 1

## 2018-06-04 MED ORDER — TRAZODONE HCL 50 MG PO TABS
50.0000 mg | ORAL_TABLET | Freq: Every evening | ORAL | Status: DC | PRN
  Administered 2018-06-06 – 2018-06-07 (×3): 50 mg via ORAL
  Filled 2018-06-04 (×3): qty 1

## 2018-06-04 MED ORDER — DOCUSATE SODIUM 100 MG PO CAPS
100.0000 mg | ORAL_CAPSULE | Freq: Two times a day (BID) | ORAL | Status: DC
  Administered 2018-06-04 – 2018-06-08 (×8): 100 mg via ORAL
  Filled 2018-06-04 (×8): qty 1

## 2018-06-04 NOTE — Progress Notes (Signed)
No BM since 11/7.  Has been getting miralax. Contacted Dr, Dyann Kief and he added colace.

## 2018-06-04 NOTE — Progress Notes (Signed)
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  Assessment & Plan:   Principal Problem:   Sepsis ([place]) Active Problems:   Laryngeal cancer ([place])   Metastasis of unknown origin ([place])   Hyponatremia   Diarrhea   Lactic acidosis   COPD with chronic bronchitis (HCC)   CKD (chronic kidney disease) stage 3, GFR 30-59 ml/min (HCC)   Elevated troponin   Normocytic anemia   Pressure injury of skin   Sepsis due to PNA -Fever has resolved.   -WBC's trending down -Continue gentle fluid resuscitation. -Chest x-ray w/o acute infiltrates -CT abdomen and pelvis with no occult infection. -Patient has remained afebrile and at this moment antibiotics will be transitioned to oral regimen.  Plan is for 1 more day of Augmentin. -blood cultures has remained without growth  -staging CT of chest capturing airspace disease that suggested PNA.  Laryngeal cancer with new metastatic disease: Patient status post platinum-based chemotherapy and radiation. -His CT abdomen pelvis on admission showed evidence of diffuse metastatic disease in the liver, lymph nodes of the stomach, rib.  Suspect most likely metastatic laryngeal cancer. -Oncology consult appreciated.   -images of his chest and soft tissue neck with concerns for recurrence malignancy and metastasis. -biopsy for tissue identification and confirmation needed -IR consulted to assist with US liver biopsy as an outpatient.  Iron deficiency anemia: -Continue oral iron daily - B12 normal - folate normal -Status post 2  units packed red blood cells on 05/27/2018 -no overt bleeding appreciated -follow Hgb trend with repeat CBC in a.m.  Hypertension/hyperlipidemia: -Continue statins  Right lower extremity DVT: This was diagnosed several months ago. -Continue rivaroxaban -no signs of acute bleeding   COPD:  -Currently not wheezing, not on any treatments at home. -Continue DuoNeb  Pain/psych: -Continue gabapentin 300 mg 3 times daily -Continue fluoxetine 60 mg daily  Left upper extremity swelling -Ultrasound negative for DVT -Will keep limb elevated.   DVT prophylaxis: Xarelto Code Status: Full Family Communication: mother at bedside fully updated. Disposition Plan: Oncology evaluation appreciated; CT scan of the chest demonstrated some lymph node involvement and once again BEACOPP liver masses and metastatic lesions on his ribs.  Soft tissue neck CT scan demonstrating post radiation changes and some left lymph nodes.  Physical therapy evaluation completed and recommending a skilled nursing facility for further care and rehab.  Social worker has been made aware and will assist with discharge.     Consultants:   Oncology  Procedures:   None  Antimicrobials:   Cefepime and Flagyl on 11/7->11/14  Vancomycin 11/11->11/14  augmentin 11/14 (with intentions to treat for 3 more days).   Subjective: Afebrile, denies chest pain.  Somnolent this morning after receiving pain medication.  Patient wearing 3-4 L nasal cannula oxygen supplementation.  Increase secretion on his upper airways appreciated.  Objective: Vitals:   06/03/18 1944 06/03/18 2113 06/04/18 0619 06/04/18 0903  BP:  120/89 (!) 98/59   Pulse:  100 88   Resp:  (!) 22 20   Temp:  98.7 F (37.1 C) 98.4 F (36.9 C)   TempSrc:  Oral Oral   SpO2: (!) 89% 97%  96% 98%  Weight:      Height:        Intake/Output Summary (Last 24 hours) at 06/04/2018 1316 Last data filed at 06/03/2018 2200 Gross per 24 hour  Intake 50 ml    Output 500 ml  Net -450 ml   Filed Weights   05/21/2018 1401 06/09/2018 2216  Weight: 68.9 kg 70.5 kg    Examination: General exam: Afebrile, patient very somnolent this morning after receiving pain medication.  Using nasal cannula oxygen supplementation 3-4 L and  increase secretions sound in upper airway appreciated.Marland Kitchen Respiratory system: no crackles, no wheezing. Positive rhonchi, no using accessory muscles.   Cardiovascular system: RRR. No murmurs, rubs, gallops. Gastrointestinal system: Abdomen is nondistended, soft and nontender. No organomegaly or masses felt. Normal bowel sounds heard. Central nervous system: Alert and oriented. No focal neurological deficits. Extremities: Left upper extremity swelling improving; no clubbing; no cyanosis. Skin: No open wounds, positive bruises on the posterior aspect of his left upper extremity and having some mild erythematous discoloration at the base of his neck from previous radiation therapy.   Data Reviewed: I have personally reviewed following labs and imaging studies  CBC: Recent Labs  Lab 05/29/18 0552 05/30/18 0447 06/01/18 0607 06/02/18 0747  WBC 21.8* 16.9* 17.2* 17.9*  NEUTROABS  --  14.4*  --   --   HGB 10.0* 9.0* 9.0* 8.3*  HCT 32.1* 28.8* 30.2* 27.1*  MCV 89.9 92.3 95.9 91.2  PLT 441* 398 375 161   Basic Metabolic Panel: Recent Labs  Lab 05/29/18 0552 05/30/18 0447 06/01/18 0607 06/02/18 0747 06/03/18 0628  NA 130* 130* 130* 133*  --   K 3.9 3.6 3.6 3.8  --   CL 102 104 105 107  --   CO2 21* 21* 19* 19*  --   GLUCOSE 103* 103* 99 94  --   BUN 17 16 23* 27*  --   CREATININE 0.74 0.79 1.24 1.55* 1.85*  CALCIUM 8.1* 7.8* 7.9* 7.8*  --   MG  --  1.9  --   --   --    GFR: Estimated Creatinine Clearance: 41.2 mL/min (A) (by C-G formula based on SCr of 1.85 mg/dL (H)).   Liver Function Tests: Recent Labs  Lab 05/30/18 0447  AST 48*  ALT 19  ALKPHOS 126  BILITOT 0.7  PROT 5.4*  ALBUMIN 1.6*   Sepsis  Labs: Recent Labs  Lab 05/29/18 0552 05/30/18 1505 05/31/18 0848 06/01/18 0607  PROCALCITON 1.21 1.38 1.48 1.52    Recent Results (from the past 240 hour(s))  Blood Culture (routine x 2)     Status: None   Collection Time: 05/24/2018  4:29 PM  Result Value Ref Range Status   Specimen Description BLOOD RIGHT WRIST  Final   Special Requests   Final    BOTTLES DRAWN AEROBIC AND ANAEROBIC Blood Culture adequate volume   Culture   Final    NO GROWTH 5 DAYS Performed at [facility], [address], [place], [place] [address]    Report Status 05/30/2018 FINAL  Final  Culture, blood (routine x 2)     Status: None   Collection Time: 05/29/18  9:12 AM  Result Value Ref Range Status   Specimen Description LEFT ANTECUBITAL  Final   Special Requests   Final    BOTTLES DRAWN AEROBIC AND ANAEROBIC Blood Culture adequate volume   Culture   Final    NO GROWTH 5 DAYS Performed at [facility], [address] [place], [place] [address]    Report Status 06/03/2018 FINAL  Final  Culture, blood (routine x 2)     Status: None   Collection Time: 05/29/18  9:12 AM  Result Value Ref Range Status   Specimen Description BLOOD LEFT HAND  Final   Special Requests   Final    BOTTLES DRAWN AEROBIC AND ANAEROBIC Blood Culture adequate volume   Culture   Final    NO GROWTH 5 DAYS Performed at [facility], [address], [place], [place] [address]    Report Status 06/03/2018 FINAL  Final     Scheduled Meds: . sodium chloride   Intravenous Once  . amoxicillin-clavulanate  1 tablet Oral Q12H  . ferrous sulfate  325 mg Oral Q breakfast  . FLUoxetine  60 mg Oral Daily  . gabapentin  300 mg Oral TID  . ipratropium-albuterol  3 mL Nebulization TID  . metoprolol tartrate  12.5 mg Oral BID  . mometasone-formoterol  2 puff Inhalation BID  . polyethylene glycol  17 g Oral Daily  . pravastatin  10 mg Oral q1800  . rivaroxaban  20 mg Oral Q supper   Continuous Infusions:    LOS: 10 days    Time  spent: 25 minutes   Barton Dubois, MD [facility] Pager [phone]  If 7PM-7AM, please contact night-coverage [email or web address] Password [email or web address] 06/04/2018, 1:16 PM

## 2018-06-04 NOTE — Progress Notes (Signed)
Patient says sister may be added to list of those that may receive his health information.  Amy Martin   [phone]

## 2018-06-04 NOTE — Progress Notes (Signed)
Patient received morphine 2mg  at 1726 he awakens but falls back asleep. Dulera mdi not given.

## 2018-06-05 ENCOUNTER — Other Ambulatory Visit (HOSPITAL_COMMUNITY): Payer: Self-pay | Admitting: Nurse Practitioner

## 2018-06-05 LAB — BASIC METABOLIC PANEL
ANION GAP: 7 (ref 5–15)
BUN: 39 mg/dL — AB (ref 6–20)
CHLORIDE: 109 mmol/L (ref 98–111)
CO2: 18 mmol/L — ABNORMAL LOW (ref 22–32)
Calcium: 7.9 mg/dL — ABNORMAL LOW (ref 8.9–10.3)
Creatinine, Ser: 2.07 mg/dL — ABNORMAL HIGH (ref 0.61–1.24)
GFR calc non Af Amer: 34 mL/min — ABNORMAL LOW (ref >60)
GFR, EST AFRICAN AMERICAN: 39 mL/min — AB (ref >60)
GLUCOSE: 103 mg/dL — AB (ref 70–99)
POTASSIUM: 4.1 mmol/L (ref 3.5–5.1)
Sodium: 134 mmol/L — ABNORMAL LOW (ref 135–145)

## 2018-06-05 LAB — CBC
HEMATOCRIT: 27.5 % — AB (ref 39.0–52.0)
HEMOGLOBIN: 8.6 g/dL — AB (ref 13.0–17.0)
MCH: 28.9 pg (ref 26.0–34.0)
MCHC: 31.3 g/dL (ref 30.0–36.0)
MCV: 92.3 fL (ref 80.0–100.0)
NRBC: 0 % (ref 0.0–0.2)
Platelets: 429 10*3/uL — ABNORMAL HIGH (ref 150–400)
RBC: 2.98 MIL/uL — ABNORMAL LOW (ref 4.22–5.81)
RDW: 16.5 % — AB (ref 11.5–15.5)
WBC: 14.1 10*3/uL — ABNORMAL HIGH (ref 4.0–10.5)

## 2018-06-05 MED ORDER — SODIUM CHLORIDE 0.9 % IV SOLN
INTRAVENOUS | Status: AC
  Administered 2018-06-05 – 2018-06-06 (×2): via INTRAVENOUS

## 2018-06-05 NOTE — Progress Notes (Addendum)
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  Assessment & Plan:   Principal Problem:   Sepsis ([place]) Active Problems:   Laryngeal cancer ([place])   Metastasis of unknown origin ([place])   Hyponatremia   Diarrhea   Lactic acidosis   COPD with chronic bronchitis (HCC)   CKD (chronic kidney disease) stage 3, GFR 30-59 ml/min (HCC)   Elevated troponin   Normocytic anemia   Pressure injury of skin   Sepsis due to PNA -Fever has resolved.   -WBC's continue trending down -Continue gentle fluid resuscitation. -Chest x-ray w/o acute infiltrates -CT abdomen and pelvis with no occult infection. -Patient has completed antibiotic therapy has remained at this moment afebrile. -blood cultures has remained without growth  -staging CT of chest capturing airspace disease that suggested PNA.  Laryngeal cancer with new metastatic disease: Patient status post platinum-based chemotherapy and radiation. -His CT abdomen pelvis on admission showed evidence of diffuse metastatic disease in the liver, lymph nodes of the stomach, rib.  Suspect most likely metastatic laryngeal cancer. -Oncology consult appreciated.   -images of his chest and soft tissue neck with concerns for recurrence malignancy and metastasis. -biopsy for tissue identification and confirmation needed -IR consulted to assist with US liver biopsy as an outpatient.  Iron deficiency anemia: -Continue oral iron daily - B12 normal - folate normal -Status post 2 units packed red blood cells on 05/27/2018 -no  overt bleeding appreciated -Continue to follow hemoglobin trend intermittently.  Hypertension/hyperlipidemia: -Continue statins  Right lower extremity DVT: This was diagnosed several months ago. -Continue rivaroxaban -no signs of acute bleeding   COPD:  -Currently not wheezing, not on any treatments at home. -Continue DuoNeb  Pain/psych: -Continue gabapentin 300 mg 3 times daily -Continue fluoxetine 60 mg daily  Left upper extremity swelling -Ultrasound negative for DVT -Will keep limb elevated.  Acute on chronic renal failure: stage 3 at baseline -Appears to be associated with prerenal azotemia from dehydration and also the use of vancomycin -Nephrotoxic agents has been discontinued -Continue to encourage adequate hydration -Gentle IV fluids will be given overnight -Repeat basic metabolic panel in a.m.   DVT prophylaxis: Xarelto Code Status: Full Family Communication: mother at bedside fully updated. Disposition Plan: Oncology evaluation appreciated; CT scan of the chest demonstrated some lymph node involvement and once again BEACOPP liver masses and metastatic lesions on his ribs.  Soft tissue neck CT scan demonstrating post radiation changes and some left lymph nodes.  Physical therapy evaluation completed and recommending a skilled nursing facility for further care and rehab.  Social worker has been made aware and will assist with discharge.     Consultants:   Oncology  Procedures:   None  Antimicrobials:   Cefepime and Flagyl on 11/7->11/14  Vancomycin 11/11->11/14  augmentin 11/14 (with intentions to treat for 3 more days).   Subjective: No fever, no chest pain.  Reports feeling weak and is still short of breath on exertion.  Intermittent episode of productive coughing spells reported.  Objective: Vitals:   06/05/18 0620 06/05/18 0736 06/05/18 1348 06/05/18 1407  BP: 122/72  126/70   Pulse: (!) 107  87  Resp: 20  20   Temp: 98.9 F (37.2 C)   99 F (37.2 C)   TempSrc: Oral  Oral   SpO2: 95% 90% 92% 92%  Weight:      Height:        Intake/Output Summary (Last 24 hours) at 06/05/2018 1652 Last data filed at 06/05/2018 1530 Gross per 24 hour  Intake 120 ml  Output 701 ml  Net -581 ml   Filed Weights   05/26/2018 1401 05/26/2018 2216  Weight: 68.9 kg 70.5 kg    Examination: General exam: Alert, awake, oriented x 3; able to answer questions appropriately and expresses wishes.  No overnight events.  Continues to require oxygen supplementation and expressed feeling short of breath with exertion.  Patient also reported still intermittent episode of productive coughing spells. Respiratory system: Positive rhonchi, currently no wheezing appreciated; no using accessory muscles.  Wearing 3-4 L nasal cannula supplementation. Cardiovascular system:RRR. No murmurs, rubs, gallops. Gastrointestinal system: Abdomen is nondistended, soft and nontender. No organomegaly or masses felt. Normal bowel sounds heard. Central nervous system: Alert and oriented. No focal neurological deficits. Extremities: Left upper extremity swelling appreciated (but much better); no clubbing, no cyanosis. Skin: No no open wounds, positive bruises on the posterior aspect of his left upper extremity and also having some mild erythematous discoloration at the base of his neck from previous radiation therapy (unchanged). Psychiatry: Judgement and insight appear normal.  Flat affect; no suicidal ideation or hallucinations.   Data Reviewed: I have personally reviewed following labs and imaging studies  CBC: Recent Labs  Lab 05/30/18 0447 06/01/18 0607 06/02/18 0747 06/05/18 0605  WBC 16.9* 17.2* 17.9* 14.1*  NEUTROABS 14.4*  --   --   --   HGB 9.0* 9.0* 8.3* 8.6*  HCT 28.8* 30.2* 27.1* 27.5*  MCV 92.3 95.9 91.2 92.3  PLT 398 375 399 702*   Basic Metabolic Panel: Recent Labs  Lab 05/30/18 0447 06/01/18 0607 06/02/18 0747 06/03/18 0628 06/05/18 0605  NA  130* 130* 133*  --  134*  K 3.6 3.6 3.8  --  4.1  CL 104 105 107  --  109  CO2 21* 19* 19*  --  18*  GLUCOSE 103* 99 94  --  103*  BUN 16 23* 27*  --  39*  CREATININE 0.79 1.24 1.55* 1.85* 2.07*  CALCIUM 7.8* 7.9* 7.8*  --  7.9*  MG 1.9  --   --   --   --    GFR: Estimated Creatinine Clearance: 36.8 mL/min (A) (by C-G formula based on SCr of 2.07 mg/dL (H)).   Liver Function Tests: Recent Labs  Lab 05/30/18 0447  AST 48*  ALT 19  ALKPHOS 126  BILITOT 0.7  PROT 5.4*  ALBUMIN 1.6*   Sepsis Labs: Recent Labs  Lab 05/30/18 1505 05/31/18 0848 06/01/18 0607  PROCALCITON 1.38 1.48 1.52    Recent Results (from the past 240 hour(s))  Culture, blood (routine x 2)     Status: None   Collection Time: 05/29/18  9:12 AM  Result Value Ref Range Status   Specimen Description LEFT ANTECUBITAL  Final   Special Requests   Final    BOTTLES DRAWN AEROBIC AND ANAEROBIC Blood Culture adequate volume   Culture   Final    NO GROWTH 5 DAYS Performed at [facility], [address], [place], [place] [address]    Report Status 06/03/2018 FINAL  Final  Culture, blood (routine x 2)     Status:  None   Collection Time: 05/29/18  9:12 AM  Result Value Ref Range Status   Specimen Description BLOOD LEFT HAND  Final   Special Requests   Final    BOTTLES DRAWN AEROBIC AND ANAEROBIC Blood Culture adequate volume   Culture   Final    NO GROWTH 5 DAYS Performed at [facility], [address] [place], [place] [address]    Report Status 06/03/2018 FINAL  Final     Scheduled Meds: . sodium chloride   Intravenous Once  . docusate sodium  100 mg Oral BID  . ferrous sulfate  325 mg Oral Q breakfast  . FLUoxetine  60 mg Oral Daily  . gabapentin  300 mg Oral TID  . ipratropium-albuterol  3 mL Nebulization TID  . metoprolol tartrate  12.5 mg Oral BID  . mometasone-formoterol  2 puff Inhalation BID  . polyethylene glycol  17 g Oral Daily  . pravastatin  10 mg Oral q1800  . rivaroxaban  20 mg Oral  Q supper   Continuous Infusions:    LOS: 11 days    Time spent: 25 minutes   Barton Dubois, MD [facility] Pager [phone]  If 7PM-7AM, please contact night-coverage [email or web address] Password [facility] 06/05/2018, 4:52 PM

## 2018-06-06 ENCOUNTER — Other Ambulatory Visit (HOSPITAL_COMMUNITY): Payer: Self-pay | Admitting: Nurse Practitioner

## 2018-06-06 DIAGNOSIS — R531 Weakness: Secondary | ICD-10-CM

## 2018-06-06 DIAGNOSIS — Z515 Encounter for palliative care: Secondary | ICD-10-CM

## 2018-06-06 DIAGNOSIS — Z7189 Other specified counseling: Secondary | ICD-10-CM

## 2018-06-06 LAB — BASIC METABOLIC PANEL
Anion gap: 10 (ref 5–15)
BUN: 42 mg/dL — ABNORMAL HIGH (ref 6–20)
CHLORIDE: 110 mmol/L (ref 98–111)
CO2: 15 mmol/L — ABNORMAL LOW (ref 22–32)
Calcium: 7.9 mg/dL — ABNORMAL LOW (ref 8.9–10.3)
Creatinine, Ser: 2.05 mg/dL — ABNORMAL HIGH (ref 0.61–1.24)
GFR calc Af Amer: 40 mL/min — ABNORMAL LOW (ref >60)
GFR calc non Af Amer: 34 mL/min — ABNORMAL LOW (ref >60)
GLUCOSE: 92 mg/dL (ref 70–99)
Potassium: 4.6 mmol/L (ref 3.5–5.1)
Sodium: 135 mmol/L (ref 135–145)

## 2018-06-06 MED ORDER — SALINE SPRAY 0.65 % NA SOLN
2.0000 | NASAL | Status: DC | PRN
  Administered 2018-06-07 (×2): 2 via NASAL
  Filled 2018-06-06: qty 44

## 2018-06-06 MED ORDER — FENTANYL CITRATE (PF) 100 MCG/2ML IJ SOLN
25.0000 ug | Freq: Four times a day (QID) | INTRAMUSCULAR | Status: DC | PRN
  Administered 2018-06-07 – 2018-06-08 (×5): 50 ug via INTRAVENOUS
  Filled 2018-06-06 (×5): qty 2

## 2018-06-06 MED ORDER — SODIUM BICARBONATE 650 MG PO TABS
650.0000 mg | ORAL_TABLET | Freq: Two times a day (BID) | ORAL | Status: DC
  Administered 2018-06-06 – 2018-06-08 (×5): 650 mg via ORAL
  Filled 2018-06-06 (×5): qty 1

## 2018-06-06 MED ORDER — SODIUM CHLORIDE 0.9 % IV BOLUS
500.0000 mL | Freq: Once | INTRAVENOUS | Status: AC
  Administered 2018-06-06: 500 mL via INTRAVENOUS

## 2018-06-06 MED ORDER — SODIUM CHLORIDE 0.9 % IV SOLN
INTRAVENOUS | Status: AC
  Administered 2018-06-07: 01:00:00 via INTRAVENOUS

## 2018-06-06 MED ORDER — ENSURE ENLIVE PO LIQD
237.0000 mL | Freq: Two times a day (BID) | ORAL | Status: DC
  Administered 2018-06-08: 237 mL via ORAL

## 2018-06-06 NOTE — Consult Note (Signed)
Consultation Note Date: 06/06/2018   Patient Name: Timothy Parks  DOB: 1960-09-17  MRN: [id number]  Age / Sex: 57 y.o., male  PCP: Timothy Parks Referring Physician: Barton Dubois, MD  Reason for Consultation: Establishing goals of care  HPI/Patient Profile: 57 y.o. male  with past medical history of laryngeal cancer s/p chemo and radiation, DVT on Xarelto, HTN, COPD, anxiety, smoker, recovering ETOH abuse admitted on 06/11/2018 with diarrhea and weakness x3 weeks. CT ab/pelvis revealed new evidence of diffuse metastatic disease involving the liver, retroperitoneal lymph nodes, peritoneum, and left 8th and 10th ribs. Oncology following. CT chest/neck with concerns for recurrent malignancy and metastasis. Pending outpatient IR liver biopsy. Also found to have pneumonia currently afebrile and WBC's trending down. Patient has completed antibiotic therapy. Palliative medicine consultation for goals of care/advanced directive.   Clinical Assessment and Goals of Care:  I have reviewed medical records, discussed with care team, and met with patient, mother Timothy Parks), and sister (Timothy Parks) at bedside to discuss diagnosis, Timothy Parks, EOL wishes, disposition and options.  Upon arrival to room, physical therapist attempting to work with patient. He is lethargic and confused. He was recently given prn morphine for rib pain. PT able to get patient into recliner, but he remains intermittently lethargic and confused throughout my visit. Does not participate in conversation.   I introduced Palliative Medicine as specialized medical care for people living with serious illness. It focuses on providing relief from the symptoms and stress of a serious illness. The goal is to improve quality of life for both the patient and the family.  We discussed a brief life review of the patient. 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 They share the "damage that has been done" to his body in the past including history of alcohol abuse (quit in 2013) and continuous tobacco abuse. He was living in a trailer full of mold until Timothy Parks forced him to move out into an [place] in May (during his chemo/radiation). They do share that in the past, he enjoyed working for the city of [place] and loves puzzle books. His mother shares that he has the ability to "make anyone laugh."   Discussed oncology journey since March when he was diagnosed. He completed chemo and radiation and was hopeful with repeat scan that cancer was cured. Discussed events leading up to hospitalization including diagnoses and interventions. Family is aware of CT results and metastatic cancer. They understand biopsy is next step before treatment options can be discussed.   Discussed concern with cancer being in many areas and if treatment is an option, likely palliative in nature. Discussed functional, nutritional, and cognitive status playing a role in 'big picture' and whether or not he would be a candidate for oncology options. Timothy Parks and Timothy Parks share that his nutritional status has been very poor since chemo/radiation. He will drink Ensure at home.  Patient's mother is very concerned about his functional status and if rehab will help. We did discuss poor functional being multifactorial (dehydration, pneumonia, and cancer). Timothy Parks and Timothy Parks are shocked with how fast the cancer has spread.   I attempted to elicit values and goals of care important to the patient and family. Advanced directives, concepts specific to code status, and artifical feeding and hydration were discussed. Family shares that feeding tube has been brought up outpatient, since his nutritional status has been declining. Timothy Parks and Timothy Parks agree that a feeding tube would not  help his "quality of life" especially if ongoing confusion. I explained being in a different place now with multiple areas of metastases and feeding tube not changing underlying cancer.   Timothy Parks was present during patient's conversation with Dr. Dyann Kief yesterday. Timothy Parks confirms her brother's decision for DO NOT RESUSCITATE code status. Timothy Parks tells me Timothy Parks spoke of wanting his mother Timothy Parks) as HCPOA if unable to make decisions for himself. Timothy Parks is overwhelmed and tells me she does not want to be the one making decisions for [name]. I explained importance of continuing conversations regarding Timothy Parks and EOL wishes with Timothy Parks during lucid moments. Timothy Parks and her brother are also available and supportive. I did explain importance of continuing conversations as a family.   Timothy Parks further explains conversation from yesterday. "He wants to live" and was agreeable with rehab and outpatient biopsy to further determine options. At other points in our conversation, Timothy Parks and Timothy Parks continue to speak of wanting "comfort" and "quality" for Timothy Parks.   Answered questions and concerns regarding palliative versus hospice. The difference between aggressive medical intervention and comfort care was discussed.   Introduced MOST form. Provided Hard Choices booklet to review. Family interested in outpatient palliative follow-up at SNF if possible. PMT contact information given. We plan to meet again tomorrow, 11/20 at 12:15pm and hopeful Timothy Parks will be able to further participate in conversation.    SUMMARY OF RECOMMENDATIONS    Good initial palliative discussion with patient's mother and sister. Patient is unable to participate in [place] discussion today.   DNR in the event of cardiac arrest. Otherwise, continue FULL scope treatment and current interventions.  Current plan is SNF for rehab. Pending outpatient liver biopsy.   Discussed at length advanced directives/MOST form and importance of discussing GOC/EOL wishes with patient. Mother and  sister seem to be leaning towards comfort/quality approach if he continues to decline. They do not believe a feeding tube will give him better "quality of life." Hard Choices book given.   PMT will continue to follow inpatient. F/u tomorrow 12:15pm with family.   May benefit from outpatient palliative referral at SNF for ongoing [place] and transition to hospice when appropriate. May not be a candidate for aggressive oncology options with declining functional status and poor nutritional status.   Code Status/Advance Care Planning:  DNR  Symptom Management:   Fentanyl 25-72mg IV q6h prn severe pain/dyspnea. (Will d/c Morphine with elevated creatinine).   Palliative Prophylaxis:   Aspiration, Delirium Protocol, Frequent Pain Assessment, Oral Care and Turn Reposition  Psycho-social/Spiritual:   Desire for further Chaplaincy support: yes  Additional Recommendations: Caregiving  Support/Resources, Compassionate Wean Education and Education on Hospice  Prognosis:   Unable to determine: Poor prognosis with hx of laryngeal cancer with new metastases, acute respiratory failure secondary to pneumonia, and declining functional and nutritional status.   Discharge Planning: To Be Determined: Likely SNF for rehab.     Primary Diagnoses: Present on Admission: . Sepsis (  [place]) . Laryngeal cancer ([place]) . Metastasis of unknown origin ([place]) . Hyponatremia . Diarrhea   I have reviewed the medical record, interviewed the patient and family, and examined the patient. The following aspects are pertinent.  Past Medical History:  Diagnosis Date  . Anxiety   . Cancer ([place])    laryngeal  . COPD (chronic obstructive pulmonary disease) ([place])    pt states he was told he has COPD by health department  . Hypertension   . Recovering alcoholic ([place])    Social History   Socioeconomic History  . Marital status: Single    Spouse name: Not on file  . Number of children: Not on file  . Years of  education: Not on file  . Highest education level: Not on file  Occupational History  . Not on file  Social Needs  . Financial resource strain: Not on file  . Food insecurity:    Worry: Not on file    Inability: Not on file  . Transportation needs:    Medical: Not on file    Non-medical: Not on file  Tobacco Use  . Smoking status: Current Some Day Smoker    Packs/day: 0.00    Types: Cigarettes  . Smokeless tobacco: Current User    Types: Snuff  . Tobacco comment: 2 cigarettes with coffee in the morning  Substance and Sexual Activity  . Alcohol use: No    Frequency: Never    Comment: in recovery x since 2013  . Drug use: No  . Sexual activity: Not Currently  Lifestyle  . Physical activity:    Days per week: Not on file    Minutes per session: Not on file  . Stress: Not on file  Relationships  . Social connections:    Talks on phone: Not on file    Gets together: Not on file    Attends religious service: Not on file    Active member of club or organization: Not on file    Attends meetings of clubs or organizations: Not on file    Relationship status: Not on file  Other Topics Concern  . Not on file  Social History Narrative  . Not on file   Family History  Problem Relation Age of Onset  . Bladder Cancer Mother        32  . Colon cancer Father   . Thyroid disease Sister   . Heart disease Brother        stent placement  . Heart disease Maternal Grandmother   . Diabetes Maternal Grandfather   . Cancer Paternal Grandmother   . AAA (abdominal aortic aneurysm) Paternal Grandfather    Scheduled Meds: . sodium chloride   Intravenous Once  . docusate sodium  100 mg Oral BID  . ferrous sulfate  325 mg Oral Q breakfast  . FLUoxetine  60 mg Oral Daily  . gabapentin  300 mg Oral TID  . ipratropium-albuterol  3 mL Nebulization TID  . metoprolol tartrate  12.5 mg Oral BID  . mometasone-formoterol  2 puff Inhalation BID  . polyethylene glycol  17 g Oral Daily  .  pravastatin  10 mg Oral q1800  . rivaroxaban  20 mg Oral Q supper  . sodium bicarbonate  650 mg Oral BID   Continuous Infusions: PRN Meds:.guaiFENesin-dextromethorphan, morphine injection, ondansetron **OR** ondansetron (ZOFRAN) IV, sodium chloride, traZODone Medications Prior to Admission:  Prior to Admission medications   Medication Sig Start Date End Date Taking? Authorizing Provider  acetaminophen (TYLENOL) 500 MG tablet Take 1,000 mg by mouth every 6 (six) hours as needed.   Yes [provider]  alum & mag hydroxide-simeth (MAALOX/MYLANTA) 200-200-20 MG/5ML suspension Take 30 mLs by mouth as needed for indigestion or heartburn.   Yes [provider]  amLODipine (NORVASC) 10 MG tablet Take 10 mg by mouth at bedtime.    Yes [provider]  doxylamine, Sleep, (UNISOM) 25 MG tablet Take 25 mg by mouth at bedtime as needed for sleep.   Yes [provider]  FLUoxetine (PROZAC) 20 MG tablet Take 60 mg by mouth daily.    Yes [provider]  gabapentin (NEURONTIN) 300 MG capsule Take 1 capsule (300 mg total) by mouth 3 (three) times daily. 11/29/17  Yes Holley Bouche, NP  lidocaine-prilocaine (EMLA) cream Apply 1 application topically as directed. 10/17/17  Yes [provider]  lovastatin (MEVACOR) 20 MG tablet Take 20 mg by mouth at bedtime.   Yes [provider]  ondansetron (ZOFRAN) 8 MG tablet Take 1 tablet by mouth 2 (two) times daily as needed. 12/19/17 12/19/18 Yes [provider]  rivaroxaban (XARELTO) 20 MG TABS tablet Take 1 tablet (20 mg total) by mouth daily with supper. 01/06/18 05/22/2018 Yes Lorin Glass, PA-C  prochlorperazine (COMPAZINE) 10 MG tablet Take 1 tablet (10 mg total) by mouth every 6 (six) hours as needed (Nausea or vomiting). 10/17/17 12/06/17  Derek Jack, MD   Allergies  Allergen Reactions  . Tetracyclines & Related Anxiety   Review of Systems  Unable to perform ROS: Mental status  change   Physical Exam  Constitutional: He is easily aroused. He appears ill.  Intermittently lethargic-given morphine prior to my visit.  HENT:  Head: Normocephalic and atraumatic.  Pulmonary/Chest: Accessory muscle usage present. No tachypnea. No respiratory distress.  Neurological: He is easily aroused.  Wakes to voice, drowsy, confused  Skin: Skin is warm and dry.  Psychiatric: His speech is delayed. Cognition and memory are impaired. He is inattentive.  Nursing note and vitals reviewed.  Vital Signs: BP (!) 88/53 (BP Location: Right Arm)   Pulse (!) 107   Temp 98.9 F (37.2 C) (Oral)   Resp 18   Ht _0  (1.702 m)   Wt 70.5 kg   SpO2 95%   BMI 24.34 kg/m  Pain Scale: 0-10 POSS *See Group Information*: 1-Acceptable,Awake and alert Pain Score: 8    SpO2: SpO2: 95 % O2 Device:SpO2: 95 % O2 Flow Rate: .O2 Flow Rate (L/min): 4 L/min  IO: Intake/output summary:   Intake/Output Summary (Last 24 hours) at 06/06/2018 1424 Last data filed at 06/06/2018 2353 Gross per 24 hour  Intake 981.86 ml  Output 700 ml  Net 281.86 ml    LBM: Last BM Date: 06/05/18 Baseline Weight: Weight: 68.9 kg Most recent weight: Weight: 70.5 kg     Palliative Assessment/Data: PPS 50%   Flowsheet Rows     Most Recent Value  Intake Tab  Referral Department  Hospitalist  Unit at Time of Referral  Med/Surg Unit  Palliative Care Primary Diagnosis  Cancer  Palliative Care Type  New Palliative care  Reason for referral  Clarify Goals of Care  Date first seen by Palliative Care  06/06/18  Clinical Assessment  Palliative Performance Scale Score  50%  Psychosocial & Spiritual Assessment  Palliative Care Outcomes  Patient/Family meeting held?  Yes  Who was at the meeting?  mother, sister  Palliative Care Outcomes  Clarified goals of  care, Counseled regarding hospice, Improved pain interventions, Improved non-pain symptom therapy, Provided end of life care assistance, Provided psychosocial or  spiritual support, ACP counseling assistance, Linked to palliative care logitudinal support      Time In: 1100 Time Out: 1320 Time Total: 147mn Greater than 50%  of this time was spent counseling and coordinating care related to the above assessment and plan.  Signed by:  MIhor Dow FNP-C Palliative Medicine Team  Phone: [phone]: [phone] Please contact Palliative Medicine Team phone at [phone] questions and concerns.  For individual provider: See AShea Evans

## 2018-06-06 NOTE — Progress Notes (Addendum)
Initial Nutrition Assessment  DOCUMENTATION CODES:  Severe malnutrition in context of chronic illness  INTERVENTION:  Would consider repeat speech consult  -Pt eating extremely poorly, reportedly d/t having significant difficulty with certain textures & consistencies. Would appreciate ST guidance on ideal foods and diet order  Mighty Shake II TID at Breakfast, each supplement provides 480-500 kcals and 20-23 grams of protein  Magic cup at lunch/dinner, each supplement provides 290 kcal and 9 grams of protein  Ensure Enlive po BID in between meals, each supplement provides 350 kcal and 20 grams of protein  NUTRITION DIAGNOSIS:  Severe Malnutrition related to cancer and cancer related treatments, dysphagia, superimposed acute illness as evidenced by severe fat wasting and loss of >20% BW in < 6 months  GOAL:  Patient will meet greater than or equal to 90% of their needs  MONITOR:  PO intake, Supplement acceptance, Labs, Weight trends, Skin, I & O's, Goals/direction of care  REASON FOR ASSESSMENT:  LOS    ASSESSMENT:  57 y/o male PMHx tobacco/etoh abuse,  COPD, HTN, Anxiety. 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 He was lost to follow up immediately upon completion of his tx. Presented to hospital 11/7 with 3 weeks diarrhea & weakness. Met sepsis criteria. Imaging also showed widespread metastases to ribs, liver and peritoneum. Admitted for management and further work up.   RD familiar w/ pt as he followed him during his cancer tx. Pt was difficult to work with and never made any of the diet adjustments RD purposed to help with wt loss. As such, he went from 212 lbs in Feb to 191.5 lbs in May, which is when he finished tx and quickly was lost to follow up. This admission, he presented at 152 lbs, indicating a loss of 40 lbs (21% bw) since he was last seen 6 months ago  Reviewing intake records, pt ate poorly first few days of admission, eating <50%  meals, but intake improved there after and he actually ate 50-100% of meals from 11/10-11/15. However, intake has regressed and since 11/16 he has eaten <25% of nearly all his meals, sometimes eating only 5%.   Today, on RD arrival, several family/friends are present. One is attempting to feed pt. There are several opened food items on his tray table. Pt himself is confused and his speech is extremely difficult to interpret. Majority of information obtained from those at bedside.   For privacy purposes, deferred speaking of pts history PTA. Focused on what interventions we could offer pt now. Family member feeding pt reports he is having significant dysphagia and problems w/ consistencies. She says he "cannot do any solids". However, she thought ground consistencies may be suitable. Apparently pt requires extremely specific consistency types. For example, she notes he cannot tolerate the hospital's mashed potatoes because "they are not creamy enough". He also cannot eat our brand of applesauce because "it is too mealy". Further complicating matters, she reports all pts foods need to be very cold. He will accept nothing hot, warm or even room temperature. From talking with her, only items that were agreed upon were milk, pudding, frozen yogurt and jello. RD advocated for them to bring in foods from outside hospital, as the facility will be unable to accomodate many of his purported texture, consistency and temperature requirements  Patient also notes significant pain. And says this is "absolutely" having a negative impact on his intake. However, family members state the palliative NP is already in process of adjusting  pt pain regimen.  At this time, supplements seem to be best option. Will order pt myriad of supplements in attempt to find one he will readily accept. Also highly recommend ST consultation. Feel this would be appropriate, even if aggressive care is not to be pursued, as it would improve his QOL.    Labs: BUN/Creat: 42/2.05 (worsening), Albumin: 1.6, WBC:14.1 Meds: Colace, Iron, Miralax, Bicarb, IVF, Morphine  Recent Labs  Lab 06/02/18 0747 06/03/18 0628 06/05/18 0605 06/06/18 0451  NA 133*  --  134* 135  K 3.8  --  4.1 4.6  CL 107  --  109 110  CO2 19*  --  18* 15*  BUN 27*  --  39* 42*  CREATININE 1.55* 1.85* 2.07* 2.05*  CALCIUM 7.8*  --  7.9* 7.9*  GLUCOSE 94  --  103* 92   NUTRITION - FOCUSED PHYSICAL EXAM:   Most Recent Value  Orbital Region  Severe depletion  Upper Arm Region  Severe depletion  Thoracic and Lumbar Region  Unable to assess  Buccal Region  Moderate depletion  Temple Region  Severe depletion  Clavicle Bone Region  Moderate depletion  Clavicle and Acromion Bone Region  Moderate depletion  Scapular Bone Region  Unable to assess  Dorsal Hand  Mild depletion  Patellar Region  Unable to assess  Anterior Thigh Region  Unable to assess  Posterior Calf Region  Unable to assess     Diet Order:   Diet Order            DIET SOFT Room service appropriate? Yes; Fluid consistency: Thin  Diet effective now             EDUCATION NEEDS:  No education needs have been identified at this time  Skin:  Stage II PU to Bilateral buttocks, MASD to buttocks, groin, sacrum and umbilicus  Last BM:  68/25- diarrhea  Height:  Ht Readings from Last 1 Encounters:  05/21/2018 5' 7"  (1.702 m)   Weight:  Wt Readings from Last 1 Encounters:  06/06/2018 70.5 kg   Wt Readings from Last 10 Encounters:  06/03/2018 70.5 kg  02/01/18 81.6 kg  01/06/18 81.6 kg  12/06/17 86.9 kg  12/05/17 87.5 kg  11/29/17 88 kg  11/22/17 89.6 kg  11/15/17 90.6 kg  11/08/17 93.6 kg  11/01/17 94.3 kg   Ideal Body Weight:  67.3 kg  BMI:  Body mass index is 24.34 kg/m.  Estimated Nutritional Needs:  Kcal:  2100-2350 (30-33 kcal/kg bw) Protein:  98-113 (1.4-1.6g/kg bw) Fluid:  >2.1 L fluid  Burtis Junes RD, LDN, CNSC Clinical Nutrition Available Tues-Sat via Pager:  [phone] 06/06/2018 6:19 PM

## 2018-06-06 NOTE — Progress Notes (Signed)
Physical Therapy Treatment Patient Details Name: Timothy Parks MRN: [id number] DOB: 10/05/1960 Today's Date: 06/06/2018    History of Present Illness Timothy Parks is a 57 y.o. 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    PT Comments    Patient presents slightly lethargic and confused possibly due to medication and having oxygen off for an extended period of time.  Patient requires more assistance for sitting up, sit to stands, transfers and limited to a few steps to transfer to chair due to poor standing balance and fatigue.  Patient tolerated sitting up in chair with family members in room after therapy.  Patient will benefit from continued physical therapy in hospital and recommended venue below to increase strength, balance, endurance for safe ADLs and gait.    Follow Up Recommendations  SNF     Equipment Recommendations  None recommended by PT    Recommendations for Other Services       Precautions / Restrictions Precautions Precautions: Fall Restrictions Weight Bearing Restrictions: No    Mobility  Bed Mobility Overal bed mobility: Needs Assistance Bed Mobility: Supine to Sit     Supine to sit: Min assist;Mod assist     General bed mobility comments: slow labored movement requiring frequent verbal/tactile cueing  Transfers Overall transfer level: Needs assistance Equipment used: Rolling walker (2 wheeled) Transfers: Sit to/from [facility] Sit to Stand: Min assist;Mod assist Stand pivot transfers: Min assist;Mod assist       General transfer comment: slow unsteady movement  Ambulation/Gait Ambulation/Gait assistance: Mod assist Gait Distance (Feet): 3 Feet Assistive device: Rolling walker (2 wheeled) Gait Pattern/deviations: Decreased step  length - right;Decreased step length - left;Decreased stride length Gait velocity: slow   General Gait Details: limited to 4-5 slow unsteady steps due mosty to diffiuclty breathing, generalized weakness   Stairs             Wheelchair Mobility    Modified Rankin (Stroke Patients Only)       Balance Overall balance assessment: Needs assistance Sitting-balance support: Feet supported;No upper extremity supported Sitting balance-Leahy Scale: Fair     Standing balance support: Bilateral upper extremity supported;During functional activity Standing balance-Leahy Scale: Fair Standing balance comment: using RW                            Cognition Arousal/Alertness: Awake/alert Behavior During Therapy: WFL for tasks assessed/performed Overall Cognitive Status: Impaired/Different from baseline Area of Impairment: Awareness;Safety/judgement;Attention                   Current Attention Level: Selective           General Comments: Patient presents slightly confused, able to follow directions with prompts, verbal/tactile cueing      Exercises      General Comments        Pertinent Vitals/Pain Pain Assessment: No/denies pain    Home Living                      Prior Function            PT Goals (current goals can now be found in the care plan section) Acute Rehab PT Goals Patient Stated Goal: return home after rehab PT Goal Formulation: With patient/family Time For Goal Achievement:  06/16/18 Potential to Achieve Goals: Good Progress towards PT goals: Not progressing toward goals - comment(slightly confused possibly due to medication)    Frequency    Min 3X/week      PT Plan Current plan remains appropriate    Co-evaluation              AM-PAC PT "6 Clicks" Daily Activity  Outcome Measure  Difficulty turning over in bed (including adjusting bedclothes, sheets and blankets)?: Unable Difficulty moving from lying  on back to sitting on the side of the bed? : Unable Difficulty sitting down on and standing up from a chair with arms (e.g., wheelchair, bedside commode, etc,.)?: Unable Help needed moving to and from a bed to chair (including a wheelchair)?: A Lot Help needed walking in hospital room?: A Lot Help needed climbing 3-5 steps with a railing? : A Lot 6 Click Score: 9    End of Session   Activity Tolerance: Patient limited by fatigue;Patient limited by lethargy Patient left: in chair;with call bell/phone within reach;with chair alarm set;with family/visitor present Nurse Communication: Mobility status PT Visit Diagnosis: Unsteadiness on feet (R26.81);Other abnormalities of gait and mobility (R26.89);Muscle weakness (generalized) (M62.81)     Time: 1683-7290 PT Time Calculation (min) (ACUTE ONLY): 18 min  Charges:  $Therapeutic Activity: 8-22 mins                     12:33 PM, 06/06/18 Lonell Grandchild, MPT Physical Therapist with [facility] [phone] office [phone] mobile phone

## 2018-06-06 NOTE — Progress Notes (Signed)
PROGRESS NOTE    Timothy Parks  XTK:[id number] DOB: [birth year] DOA: 05/30/2018 PCP: Ginger Organ   Brief Narrative:  57 year old with past medical history relevant for laryngeal cancer status post platinum based chemotherapy and radiation, DVT on rivaroxaban, COPD, hypertension, hyperlipidemia, chronic pain, anxiety/depression who presents with fever, abdominal pain, diarrhea found to have sepsis of unclear etiology and new metastases of unclear etiology.  Assessment & Plan:   Principal Problem:   Sepsis ([place]) Active Problems:   Laryngeal cancer ([place])   Metastasis of unknown origin ([place])   Hyponatremia   Diarrhea   Lactic acidosis   COPD with chronic bronchitis ([facility])   CKD (chronic kidney disease) stage 3, GFR 30-59 ml/min ([facility])   Elevated troponin   Normocytic anemia   Pressure injury of skin   Sepsis due to PNA -Fever has resolved.   -WBC's continue trending down -Continue gentle fluid resuscitation. -Chest x-ray w/o acute infiltrates -CT abdomen and pelvis with no occult infection. -Patient has completed antibiotic therapy and has remained at this moment afebrile. -blood cultures has remained without growth  -staging CT of chest capturing airspace disease that suggested PNA.  Laryngeal cancer with new metastatic disease: Patient status post platinum-based chemotherapy and radiation. -His CT abdomen pelvis on admission showed evidence of diffuse metastatic disease in the liver, lymph nodes of the stomach, rib.  Suspect most likely metastatic laryngeal cancer. -Oncology consult appreciated.   -images of his chest and soft tissue neck with concerns for recurrence malignancy and metastasis. -biopsy for tissue identification and confirmation needed -IR consulted to assist with US liver biopsy as an outpatient. -Appreciate assistance from palliative care; will follow recommendations.  Iron deficiency anemia: -Continue oral iron daily - B12 normal -  folate normal -Status post 2 units packed red blood cells on 05/27/2018 -no overt bleeding appreciated -Continue to follow hemoglobin trend intermittently.  Hypertension/hyperlipidemia: -Continue statins  Right lower extremity DVT: This was diagnosed several months ago. -Continue rivaroxaban -no signs of acute bleeding   COPD:  -Currently not wheezing, not on any treatments at home. -Continue DuoNeb  Pain/psych: -Continue gabapentin 300 mg 3 times daily -Continue fluoxetine 60 mg daily  Left upper extremity swelling -Ultrasound negative for DVT -Will keep limb elevated.  Acute on chronic renal failure: stage 3 at baseline -Appears to be associated with prerenal azotemia from dehydration and also the use of vancomycin -Nephrotoxic agents has been discontinued -Continue to encourage adequate hydration -Gentle IV fluids will be given overnight -Repeat basic metabolic panel in a.m.   DVT prophylaxis: Xarelto Code Status: DNR Family Communication: mother at bedside fully updated. Disposition Plan: Oncology evaluation appreciated; CT scan of the chest demonstrated some lymph node involvement and once again liver masses and metastatic lesions on his ribs.  Soft tissue neck CT scan demonstrating post radiation changes and some left lymph nodes.  Physical therapy evaluation completed and recommending a skilled nursing facility for further care and rehab.  Social worker has been made aware and will assist with discharge.  Palliative care consulted and is helping with goals of cares and advance directive.   Consultants:   Oncology  Palliative care  Procedures:   None  Antimicrobials:   Cefepime and Flagyl on 11/7->11/14  Vancomycin 11/11->11/14  augmentin 11/14 (with intentions to treat for 3 more days).   Subjective: No fever, continued to have intermittent episode of productive coughing spells; some generalized pain reported for he received morphine with good  control.  Currently is somnolent and  resting.  Still using 3 L nasal cannula supplementation.  Objective: Vitals:   06/06/18 0556 06/06/18 0827 06/06/18 1409 06/06/18 1451  BP: 130/72  (!) 88/53   Pulse: (!) 117  (!) 107   Resp: (!) 21  18   Temp: 98.9 F (37.2 C)     TempSrc: Oral     SpO2: 97% 92% 95% 95%  Weight:      Height:        Intake/Output Summary (Last 24 hours) at 06/06/2018 1655 Last data filed at 06/06/2018 0658 Gross per 24 hour  Intake 801.86 ml  Output 300 ml  Net 501.86 ml   Filed Weights   05/22/2018 1401 06/16/2018 2216  Weight: 68.9 kg 70.5 kg    Examination: General exam: Somnolent once again after receiving pain medication; wearing 3 L oxygen supplementation, there was no reports of any chest pain, nausea, vomiting, or abdominal pain.  Continued to have intermittent episode of productive coughing spells. Respiratory system: Positive scattered rhonchi, no wheezing; respiratory effort normal; no using accessory muscles. Cardiovascular system:RRR. No murmurs, rubs, gallops. Gastrointestinal system: Abdomen is nondistended, soft and nontender. No organomegaly or masses felt. Normal bowel sounds heard. Central nervous system: No focal neurological deficits. Extremities: No cyanosis or clubbing, trace edema bilaterally; left upper extremity swelling appreciated (continue slowly improving) Skin: No open wounds, positive bruises on the posterior aspect of his left upper extremity and also having mild erythematous discoloration at the base of his neck from previous radiation therapy (has remained unchanged). Psychiatry: Currently somnolent/lethargic and not participating much during visit.  Both last assessment done in 06/05/2017 demonstrated that he is judgement and insight appear to be normal.   Data Reviewed: I have personally reviewed following labs and imaging studies  CBC: Recent Labs  Lab 06/01/18 0607 06/02/18 0747 06/05/18 0605  WBC 17.2* 17.9* 14.1*    HGB 9.0* 8.3* 8.6*  HCT 30.2* 27.1* 27.5*  MCV 95.9 91.2 92.3  PLT 375 399 130*   Basic Metabolic Panel: Recent Labs  Lab 06/01/18 0607 06/02/18 0747 06/03/18 0628 06/05/18 0605 06/06/18 0451  NA 130* 133*  --  134* 135  K 3.6 3.8  --  4.1 4.6  CL 105 107  --  109 110  CO2 19* 19*  --  18* 15*  GLUCOSE 99 94  --  103* 92  BUN 23* 27*  --  39* 42*  CREATININE 1.24 1.55* 1.85* 2.07* 2.05*  CALCIUM 7.9* 7.8*  --  7.9* 7.9*   GFR: Estimated Creatinine Clearance: 37.2 mL/min (A) (by C-G formula based on SCr of 2.05 mg/dL (H)).   Sepsis Labs: Recent Labs  Lab 05/31/18 0848 06/01/18 0607  PROCALCITON 1.48 1.52    Recent Results (from the past 240 hour(s))  Culture, blood (routine x 2)     Status: None   Collection Time: 05/29/18  9:12 AM  Result Value Ref Range Status   Specimen Description LEFT ANTECUBITAL  Final   Special Requests   Final    BOTTLES DRAWN AEROBIC AND ANAEROBIC Blood Culture adequate volume   Culture   Final    NO GROWTH 5 DAYS Performed at [facility], [address] [place], [place] [address]    Report Status 06/03/2018 FINAL  Final  Culture, blood (routine x 2)     Status: None   Collection Time: 05/29/18  9:12 AM  Result Value Ref Range Status   Specimen Description BLOOD LEFT HAND  Final   Special Requests  Final    BOTTLES DRAWN AEROBIC AND ANAEROBIC Blood Culture adequate volume   Culture   Final    NO GROWTH 5 DAYS Performed at [facility], [address] [place], [place] [address]    Report Status 06/03/2018 FINAL  Final     Scheduled Meds: . sodium chloride   Intravenous Once  . docusate sodium  100 mg Oral BID  . ferrous sulfate  325 mg Oral Q breakfast  . FLUoxetine  60 mg Oral Daily  . gabapentin  300 mg Oral TID  . ipratropium-albuterol  3 mL Nebulization TID  . metoprolol tartrate  12.5 mg Oral BID  . mometasone-formoterol  2 puff Inhalation BID  . polyethylene glycol  17 g Oral Daily  . pravastatin  10 mg Oral q1800   . rivaroxaban  20 mg Oral Q supper  . sodium bicarbonate  650 mg Oral BID   Continuous Infusions:    LOS: 12 days    Time spent: 25 minutes   Barton Dubois, MD [facility] Pager [phone]  If 7PM-7AM, please contact night-coverage [email or web address] Password [email or web address] 06/06/2018, 4:55 PM

## 2018-06-06 NOTE — Care Management Note (Signed)
Case Management Note  Patient Details  Name: Timothy Parks MRN: [id number] Date of Birth: 1961-01-26   Status of Service:  In process, will continue to follow  If discussed at Long Length of Stay Meetings, dates discussed:  06/07/19  Additional Comments:  Sherald Barge, RN 06/06/2018, 1:33 PM

## 2018-06-07 ENCOUNTER — Inpatient Hospital Stay (HOSPITAL_COMMUNITY): Payer: Medicaid Other

## 2018-06-07 DIAGNOSIS — R1310 Dysphagia, unspecified: Secondary | ICD-10-CM

## 2018-06-07 LAB — BASIC METABOLIC PANEL
Anion gap: 6 (ref 5–15)
BUN: 47 mg/dL — ABNORMAL HIGH (ref 6–20)
CALCIUM: 7.5 mg/dL — AB (ref 8.9–10.3)
CHLORIDE: 112 mmol/L — AB (ref 98–111)
CO2: 19 mmol/L — AB (ref 22–32)
Creatinine, Ser: 2.13 mg/dL — ABNORMAL HIGH (ref 0.61–1.24)
GFR calc Af Amer: 38 mL/min — ABNORMAL LOW (ref >60)
GFR calc non Af Amer: 33 mL/min — ABNORMAL LOW (ref >60)
GLUCOSE: 116 mg/dL — AB (ref 70–99)
Potassium: 4.3 mmol/L (ref 3.5–5.1)
Sodium: 137 mmol/L (ref 135–145)

## 2018-06-07 MED ORDER — SUCRALFATE 1 GM/10ML PO SUSP
1.0000 g | Freq: Three times a day (TID) | ORAL | Status: DC
  Administered 2018-06-07 – 2018-06-08 (×6): 1 g via ORAL
  Filled 2018-06-07 (×6): qty 10

## 2018-06-07 MED ORDER — SODIUM CHLORIDE 0.9 % IV SOLN
INTRAVENOUS | Status: AC
  Administered 2018-06-07 – 2018-06-08 (×2): via INTRAVENOUS

## 2018-06-07 MED ORDER — SODIUM CHLORIDE 0.9 % IV BOLUS
250.0000 mL | Freq: Once | INTRAVENOUS | Status: AC
  Administered 2018-06-07: 250 mL via INTRAVENOUS

## 2018-06-07 MED ORDER — HYDROCODONE-ACETAMINOPHEN 5-325 MG PO TABS
1.0000 | ORAL_TABLET | Freq: Four times a day (QID) | ORAL | Status: DC | PRN
  Administered 2018-06-08 – 2018-06-09 (×2): 1 via ORAL
  Filled 2018-06-07 (×2): qty 1

## 2018-06-07 NOTE — Progress Notes (Signed)
Daily Progress Note   Patient Name: Timothy Parks       Date: 06/07/2018 DOB: August 15, 1960  Age: 57 y.o. MRN#: [id number] Attending Physician: Rodena Goldmann, DO Primary Care Physician: Ginger Organ Admit Date: 06/14/2018  Reason for Consultation/Follow-up: Establishing goals of care  Subjective: Patient more awake and alert than yesterday but remains with intermittent confusion and hallucinations. No s/s of distress or discomfort.   GOC: Mother and sister [name] and [name] at bedside this afternoon. Follow-up from our conversation yesterday. Since patient more awake today, attempted to discuss course of hospitalization including diagnoses, CT scan results, interventions, and plan of care moving forward. After conversation with Dr. Dyann Kief, patient understands concern with metastases to multiple areas. He is agreeable with biopsy. Attempted to further discuss GOC/EOL wishes including MOST form and patient becomes confused and changes the subject. Mother, [name], shares that in the past, this is way of "avoiding" the topic by changing the subject. She believes he understands but does not want to talk about these decisions. Encouraged mother and sister to continue to be supportive and present in order to help guide care team with decision making. We discussed further attempting to complete MOST form if patient able and willing to participate in conversation.   Discussed symptom management including initiation of PO pain medication (since he will not receive IV at SNF) and also trying Carafate to see if this helps with burning with swallowing. We discussed possibly adding an appetite stimulant. Patient only accepting bites intermittently. Again encouraged high calorie, high protein  foods/supplements.  Answered questions and concerns. Educational information given to mother on palliative versus hospice differences. Emotional support provided.   Length of Stay: 13  Current Medications: Scheduled Meds:  . sodium chloride   Intravenous Once  . docusate sodium  100 mg Oral BID  . feeding supplement (ENSURE ENLIVE)  237 mL Oral BID BM  . ferrous sulfate  325 mg Oral Q breakfast  . FLUoxetine  60 mg Oral Daily  . gabapentin  300 mg Oral TID  . ipratropium-albuterol  3 mL Nebulization TID  . mometasone-formoterol  2 puff Inhalation BID  . polyethylene glycol  17 g Oral Daily  . pravastatin  10 mg Oral q1800  . rivaroxaban  20 mg Oral Q supper  . sodium bicarbonate  650 mg Oral BID  . sucralfate  1 g Oral TID WC & HS    Continuous Infusions:   PRN Meds: fentaNYL (SUBLIMAZE) injection, guaiFENesin-dextromethorphan, HYDROcodone-acetaminophen, ondansetron **OR** ondansetron (ZOFRAN) IV, sodium chloride, traZODone  Physical Exam  Constitutional: He is cooperative. He appears ill.  HENT:  Head: Normocephalic and atraumatic.  Pulmonary/Chest: No accessory muscle usage. No tachypnea. No respiratory distress.  Abdominal: There is no tenderness.  Neurological: He is alert.  Periods of pleasant confusion. More awake, alert, and oriented compared to yesterday.  Skin: Skin is warm and dry.  Nursing note and vitals reviewed.          Vital Signs: BP (!) 88/63 Comment: Manual  Pulse (!) 117   Temp 98.1 F (36.7 C) (Oral)   Resp 18   Ht 5\' 7"  (1.702 m)   Wt 70.5 kg   SpO2 93%   BMI 24.34 kg/m  SpO2: SpO2: 93 % O2 Device: O2 Device: Nasal Cannula O2 Flow Rate: O2 Flow Rate (L/min): 5 L/min  Intake/output summary:   Intake/Output Summary (Last 24 hours) at 06/07/2018 1251 Last data filed at 06/07/2018 3244 Gross per 24 hour  Intake 621.7 ml  Output 550 ml  Net 71.7 ml   LBM: Last BM Date: 06/05/18 Baseline Weight: Weight: 68.9 kg Most recent weight:  Weight: 70.5 kg       Palliative Assessment/Data: PPS 50%   Flowsheet Rows     Most Recent Value  Intake Tab  Referral Department  Hospitalist  Unit at Time of Referral  Med/Surg Unit  Palliative Care Primary Diagnosis  Cancer  Date Notified  06/06/18  Palliative Care Type  New Palliative care  Reason for referral  Clarify Goals of Care  Date of Admission  05/24/2018  Date first seen by Palliative Care  06/06/18  # of days Palliative referral response time  0 Day(s)  # of days IP prior to Palliative referral  12  Clinical Assessment  Palliative Performance Scale Score  50%  Psychosocial & Spiritual Assessment  Palliative Care Outcomes  Patient/Family meeting held?  Yes  Who was at the meeting?  mother, sister  Palliative Care Outcomes  Clarified goals of care, Counseled regarding hospice, Improved pain interventions, Improved non-pain symptom therapy, Provided end of life care assistance, Provided psychosocial or spiritual support, ACP counseling assistance, Linked to palliative care logitudinal support      Patient Active Problem List   Diagnosis Date Noted  . Palliative care by specialist   . Weakness   . Pressure injury of skin 05/27/2018  . Sepsis ([place]) 05/23/2018  . Metastasis of unknown origin ([place]) 06/07/2018  . Hyponatremia 05/31/2018  . Diarrhea 06/13/2018  . Lactic acidosis 05/20/2018  . 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 CKD (chronic kidney disease) stage 3, GFR 30-59 ml/min (H[facility]) 05/20/2018  . Elevated troponin 05/27/2018  . Normocytic anemia 06/14/2018  . 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 Goals of care, counseling/discussion 09/29/2017    Palliative Care Assessment & Plan   Patient Profile: 57 y.o. male  with past medical history of laryngeal cancer s/p chemo and radiation, DVT on Xarelto, HTN, COPD, anxiety, smoker, recovering ETOH abuse admitted on 06/01/2018 with diarrhea and weakness x3 weeks. CT ab/pelvis revealed new evidence of  diffuse metastatic disease involving the liver, retroperitoneal lymph nodes, peritoneum, and left 8th and 10th ribs. Oncology following. CT chest/neck with concerns for recurrent malignancy and metastasis. Pending outpatient IR liver biopsy. Also found to have pneumonia currently  afebrile and WBC's trending down. Patient has completed antibiotic therapy. Palliative medicine consultation for goals of care/advanced directive.   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 Otherwise, continue full scope treatment and current interventions.   Attempted to further discuss [place] and EOL wishes with patient. Patient understands he is has mets but I believe he lacks the capacity to make complex medical decisions. Unable to complete MOST form today.   Added Norco 1 tab q6h prn moderate pain. Added Carafate PO QID. Patient c/o pain with swallowing s/p radiation.   Pending SNF to attempt rehab. Declining functional and nutritional status.   May benefit from outpatient palliative referral at SNF.    Code Status: DNR   Code Status Orders  (From admission, onward)         Start     Ordered   06/05/18 1652  Do not attempt resuscitation (DNR)  Continuous    Question Answer Comment  In the event of cardiac or respiratory ARREST Do not call a "code blue"   In the event of cardiac or respiratory ARREST Do not perform Intubation, CPR, defibrillation or ACLS   In the event of cardiac or respiratory ARREST Use medication by any route, position, wound care, and other measures to relive pain and suffering. May use oxygen, suction and manual treatment of airway obstruction as needed for comfort.      06/05/18 1652        Code Status History    Date Active Date Inactive Code Status Order ID Comments User Context   05/27/2018 2215 06/05/2018 1652 Full  Code [id number]  Rise Patience, MD Inpatient   05/26/2018 2221 05/27/2018 2215 DNR [id number]  Mercy Riding, MD Inpatient       Prognosis:   Unable to determine  Discharge Planning:  [facility] for rehab with Palliative care service follow-up  Care plan was discussed with patient, Dr. Manuella Ghazi, RN, mother, sister  Thank you for allowing the Palliative Medicine Team to assist in the care of this patient.   Time In: 1320 Time Out: 1420 Total Time 60 Prolonged Time Billed no      Greater than 50%  of this time was spent counseling and coordinating care related to the above assessment and plan.  Ihor Dow, FNP-C Palliative Medicine Team  Phone: [phone] Fax: [phone]  Please contact Palliative Medicine Team phone at [phone] for questions and concerns.

## 2018-06-07 NOTE — Progress Notes (Signed)
PROGRESS NOTE    Timothy Parks  ZSW:[id number] DOB: 1960/12/27 DOA: 06/07/2018 PCP: Ginger Organ   Brief Narrative:  57 year old with past medical history relevant for laryngeal cancer status post platinum based chemotherapy and radiation, DVT on rivaroxaban, COPD, hypertension, hyperlipidemia, chronic pain, anxiety/depression who presents with fever, abdominal pain, diarrhea found to have sepsis of unclear etiology and new metastases of unclear etiology. He is now off antibiotics with no further fever and improvement in leukocytosis noted.  Assessment & Plan:   Principal Problem:   Sepsis ([place]) Active Problems:   Laryngeal cancer ([place])   Metastasis of unknown origin ([place])   Hyponatremia   Diarrhea   Lactic acidosis   COPD with chronic bronchitis (H[facility])   CKD (chronic kidney disease) stage 3, GFR 30-59 ml/min ([facility]CC)   Elevated troponin   Normocytic anemia   Pressure injury of skin   Palliative care by specialist   Weakness   Sepsis due to PNA -Fever has resolved. -WBC's continue trending down -Continue gentle fluid resuscitation. -Chest x-ray w/o acute infiltrates -CT abdomen and pelvis with no occult infection. -Patient has completed antibiotic therapy and has remained at this moment afebrile. -blood cultures has remained without growth  -staging CT of chest capturing airspace disease that suggested PNA and pt is s/p treatment  Worsening hypoxemic respiratory failure -From 3L to 5L this am with some atelectasis noted on 11/11 -Repeat CXR today with possible need for IS  Laryngeal cancer with new metastatic disease: Patient status post platinum-based chemotherapy and radiation. -His CT abdomen pelvis on admission showed evidence of diffuse metastatic disease in the liver, lymph nodes of the stomach, rib. Suspect most likely metastatic laryngeal cancer. -Oncology consult appreciated.   -images of his chest and soft tissue neck with concerns for  recurrence malignancy and metastasis. -biopsy for tissue identification and confirmation needed -IR consulted to assist with US liver biopsy as an outpatient. -Appreciate assistance from palliative care; will follow recommendations.  Iron deficiency anemia: -Continueoral iron daily - B12 normal - folate normal -Status post 2 units packed red blood cells on 05/27/2018 -no overt bleeding appreciated -Continue to follow hemoglobin trend intermittently with repeat CBC in am  Hypertension/hyperlipidemia: -Continue statins  Right lower extremity DVT: This was diagnosed several months ago. -Continuerivaroxaban -no signs of acute bleeding   COPD:  -Currently not wheezing, not on any treatments at home. -Continue DuoNeb  Pain/psych: -Continue gabapentin 300 mg 3 times daily -Continue fluoxetine 60 mg daily -Appreciate palliative recommendations with use of Fentanyl  Left upper extremity swelling -Ultrasound negative for DVT -Will keep limb elevated.  Acute on chronic renal failure: stage 3 at baseline -Appears to be associated with prerenal azotemia from dehydration and also the use of vancomycin -Nephrotoxic agents has been discontinued -Continue to encourage adequate hydration -Gentle IV fluids to continue at higher rate -Repeat basic metabolic panel in a.m.   DVT prophylaxis: Xarelto Code Status: DNR Family Communication: mother at bedside fully updated. Disposition Plan: Oncology evaluation appreciated; CT scan of the chest demonstrated some lymph node involvement and once again liver masses and metastatic lesions on his ribs.  Soft tissue neck CT scan demonstrating post radiation changes and some left lymph nodes.  Physical therapy evaluation completed and recommending a skilled nursing facility for further care and rehab.  Social worker has been made aware and will assist with discharge.  Palliative care consulted and is helping with goals of cares and advance  directive.   Consultants:   Oncology  Palliative care  Procedures:   None  Antimicrobials:   Cefepime and Flagyl on 11/7->11/14  Vancomycin 11/11->11/14  augmentin 11/14->11/17   Subjective: Patient seen and evaluated today with no new acute complaints or concerns. No acute concerns or events noted overnight. Currently on 5L [place].  Objective: Vitals:   06/07/18 0555 06/07/18 0600 06/07/18 0755 06/07/18 1420  BP: (!) 80/57 (!) 88/63    Pulse: (!) 117     Resp:      Temp:      TempSrc:      SpO2: 92%  93% 93%  Weight:      Height:        Intake/Output Summary (Last 24 hours) at 06/07/2018 1449 Last data filed at 06/07/2018 0352 Gross per 24 hour  Intake 621.7 ml  Output 550 ml  Net 71.7 ml   Filed Weights   05/20/2018 1401 05/22/2018 2216  Weight: 68.9 kg 70.5 kg    Examination:  General exam: Appears calm and comfortable  Respiratory system: Clear to auscultation. Respiratory effort normal. On 5L [place]. Cardiovascular system: S1 & S2 heard, RRR. No JVD, murmurs, rubs, gallops or clicks. No pedal edema. Gastrointestinal system: Abdomen is nondistended, soft and nontender. No organomegaly or masses felt. Normal bowel sounds heard. Central nervous system: Alert and oriented. No focal neurological deficits. Extremities: Symmetric 5 x 5 power. Skin: No rashes, lesions or ulcers Psychiatry: Cannot be assessed.    Data Reviewed: I have personally reviewed following labs and imaging studies  CBC: Recent Labs  Lab 06/01/18 0607 06/02/18 0747 06/05/18 0605  WBC 17.2* 17.9* 14.1*  HGB 9.0* 8.3* 8.6*  HCT 30.2* 27.1* 27.5*  MCV 95.9 91.2 92.3  PLT 375 399 536*   Basic Metabolic Panel: Recent Labs  Lab 06/01/18 0607 06/02/18 0747 06/03/18 0628 06/05/18 0605 06/06/18 0451 06/07/18 0534  NA 130* 133*  --  134* 135 137  K 3.6 3.8  --  4.1 4.6 4.3  CL 105 107  --  109 110 112*  CO2 19* 19*  --  18* 15* 19*  GLUCOSE 99 94  --  103* 92 116*  BUN 23*  27*  --  39* 42* 47*  CREATININE 1.24 1.55* 1.85* 2.07* 2.05* 2.13*  CALCIUM 7.9* 7.8*  --  7.9* 7.9* 7.5*   GFR: Estimated Creatinine Clearance: 35.8 mL/min (A) (by C-G formula based on SCr of 2.13 mg/dL (H)). Liver Function Tests: No results for input(s): AST, ALT, ALKPHOS, BILITOT, PROT, ALBUMIN in the last 168 hours. No results for input(s): LIPASE, AMYLASE in the last 168 hours. No results for input(s): AMMONIA in the last 168 hours. Coagulation Profile: No results for input(s): INR, PROTIME in the last 168 hours. Cardiac Enzymes: No results for input(s): CKTOTAL, CKMB, CKMBINDEX, TROPONINI in the last 168 hours. BNP (last 3 results) No results for input(s): PROBNP in the last 8760 hours. HbA1C: No results for input(s): HGBA1C in the last 72 hours. CBG: No results for input(s): GLUCAP in the last 168 hours. Lipid Profile: No results for input(s): CHOL, HDL, LDLCALC, TRIG, CHOLHDL, LDLDIRECT in the last 72 hours. Thyroid Function Tests: No results for input(s): TSH, T4TOTAL, FREET4, T3FREE, THYROIDAB in the last 72 hours. Anemia Panel: No results for input(s): VITAMINB12, FOLATE, FERRITIN, TIBC, IRON, RETICCTPCT in the last 72 hours. Sepsis Labs: Recent Labs  Lab 06/01/18 0607  PROCALCITON 1.52    Recent Results (from the past 240 hour(s))  Culture, blood (routine x 2)     Status:  None   Collection Time: 05/29/18  9:12 AM  Result Value Ref Range Status   Specimen Description LEFT ANTECUBITAL  Final   Special Requests   Final    BOTTLES DRAWN AEROBIC AND ANAEROBIC Blood Culture adequate volume   Culture   Final    NO GROWTH 5 DAYS Performed at [facility], [address] [place], [place] [address]    Report Status 06/03/2018 FINAL  Final  Culture, blood (routine x 2)     Status: None   Collection Time: 05/29/18  9:12 AM  Result Value Ref Range Status   Specimen Description BLOOD LEFT HAND  Final   Special Requests   Final    BOTTLES DRAWN AEROBIC AND ANAEROBIC  Blood Culture adequate volume   Culture   Final    NO GROWTH 5 DAYS Performed at [facility], [address], [place], [place] [address]    Report Status 06/03/2018 FINAL  Final         Radiology Studies: No results found.      Scheduled Meds: . sodium chloride   Intravenous Once  . docusate sodium  100 mg Oral BID  . feeding supplement (ENSURE ENLIVE)  237 mL Oral BID BM  . ferrous sulfate  325 mg Oral Q breakfast  . FLUoxetine  60 mg Oral Daily  . gabapentin  300 mg Oral TID  . ipratropium-albuterol  3 mL Nebulization TID  . mometasone-formoterol  2 puff Inhalation BID  . polyethylene glycol  17 g Oral Daily  . pravastatin  10 mg Oral q1800  . rivaroxaban  20 mg Oral Q supper  . sodium bicarbonate  650 mg Oral BID  . sucralfate  1 g Oral TID WC & HS   Continuous Infusions:   LOS: 13 days    Time spent: 30 minutes    Rebbie Lauricella Darleen Crocker, DO [facility] Pager [phone]  If 7PM-7AM, please contact night-coverage [email or web address] Password [facility] 06/07/2018, 2:49 PM

## 2018-06-08 ENCOUNTER — Inpatient Hospital Stay (HOSPITAL_COMMUNITY): Payer: Medicaid Other

## 2018-06-08 DIAGNOSIS — R0902 Hypoxemia: Secondary | ICD-10-CM

## 2018-06-08 DIAGNOSIS — B3781 Candidal esophagitis: Secondary | ICD-10-CM

## 2018-06-08 DIAGNOSIS — Z9189 Other specified personal risk factors, not elsewhere classified: Secondary | ICD-10-CM

## 2018-06-08 LAB — CBC
HEMATOCRIT: 26.2 % — AB (ref 39.0–52.0)
Hemoglobin: 7.9 g/dL — ABNORMAL LOW (ref 13.0–17.0)
MCH: 27.6 pg (ref 26.0–34.0)
MCHC: 30.2 g/dL (ref 30.0–36.0)
MCV: 91.6 fL (ref 80.0–100.0)
Platelets: 443 10*3/uL — ABNORMAL HIGH (ref 150–400)
RBC: 2.86 MIL/uL — ABNORMAL LOW (ref 4.22–5.81)
RDW: 16.8 % — ABNORMAL HIGH (ref 11.5–15.5)
WBC: 14.4 10*3/uL — AB (ref 4.0–10.5)
nRBC: 0 % (ref 0.0–0.2)

## 2018-06-08 LAB — BASIC METABOLIC PANEL
ANION GAP: 7 (ref 5–15)
BUN: 49 mg/dL — ABNORMAL HIGH (ref 6–20)
CHLORIDE: 111 mmol/L (ref 98–111)
CO2: 19 mmol/L — ABNORMAL LOW (ref 22–32)
Calcium: 7.5 mg/dL — ABNORMAL LOW (ref 8.9–10.3)
Creatinine, Ser: 2.19 mg/dL — ABNORMAL HIGH (ref 0.61–1.24)
GFR calc non Af Amer: 32 mL/min — ABNORMAL LOW (ref >60)
GFR, EST AFRICAN AMERICAN: 37 mL/min — AB (ref >60)
Glucose, Bld: 105 mg/dL — ABNORMAL HIGH (ref 70–99)
POTASSIUM: 4.1 mmol/L (ref 3.5–5.1)
SODIUM: 137 mmol/L (ref 135–145)

## 2018-06-08 LAB — PROCALCITONIN: PROCALCITONIN: 2.3 ng/mL

## 2018-06-08 MED ORDER — PIPERACILLIN-TAZOBACTAM 3.375 G IVPB: 3.3750 g | Freq: Three times a day (TID) | INTRAVENOUS | Status: DC

## 2018-06-08 MED ORDER — VANCOMYCIN HCL 10 G IV SOLR
1500.0000 mg | Freq: Once | INTRAVENOUS | Status: AC
  Administered 2018-06-08: 1500 mg via INTRAVENOUS
  Filled 2018-06-08: qty 1500

## 2018-06-08 MED ORDER — SODIUM CHLORIDE 0.9 % IV SOLN
INTRAVENOUS | Status: AC
  Administered 2018-06-08: 16:00:00 via INTRAVENOUS

## 2018-06-08 MED ORDER — VANCOMYCIN HCL IN DEXTROSE 1-5 GM/200ML-% IV SOLN: 1000.0000 mg | INTRAVENOUS | Status: DC

## 2018-06-08 MED ORDER — FLUCONAZOLE IN SODIUM CHLORIDE 200-0.9 MG/100ML-% IV SOLN
200.0000 mg | INTRAVENOUS | Status: DC
  Administered 2018-06-09 (×2): 200 mg via INTRAVENOUS
  Filled 2018-06-08 (×6): qty 100

## 2018-06-08 MED ORDER — OLANZAPINE 5 MG PO TABS
5.0000 mg | ORAL_TABLET | Freq: Every day | ORAL | Status: DC
  Administered 2018-06-08: 5 mg via ORAL
  Filled 2018-06-08: qty 1

## 2018-06-08 MED ORDER — PIPERACILLIN-TAZOBACTAM 3.375 G IVPB
3.3750 g | Freq: Three times a day (TID) | INTRAVENOUS | Status: DC
  Administered 2018-06-08 – 2018-06-09 (×3): 3.375 g via INTRAVENOUS
  Filled 2018-06-08 (×3): qty 50

## 2018-06-08 MED ORDER — BISACODYL 5 MG PO TBEC: 5.0000 mg | DELAYED_RELEASE_TABLET | Freq: Every day | ORAL | Status: DC | PRN

## 2018-06-08 NOTE — Progress Notes (Signed)
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 He has remained off antibiotics since 11/17, but has chest x-ray demonstrating worsening development of multifocal pneumonia as well as speech/swallow issues.  Procalcitonin continues to remain elevated and suggests the need for further IV antibiotic use.   Assessment & Plan:   Principal Problem:   Sepsis ([place]) Active Problems:   Laryngeal cancer ([place])   Metastasis of unknown origin ([place])   Hyponatremia   Diarrhea   Lactic acidosis   COPD with chronic bronchitis (HCC)   CKD (chronic kidney disease) stage 3, GFR 30-59 ml/min (HCC)   Elevated troponin   Normocytic anemia   Pressure injury of skin   Palliative care by specialist   Weakness   Dysphagia   Odynophagia   Persistent hypoxemic respiratory failure likely related to worsening multifocal pneumonia -Patient had discontinued antibiotics on 11/17 for pneumonia but continues to have ongoing symptoms and elevated pro calcitonin levels.  Will restart IV vancomycin and Zosyn for aspiration coverage -Continue to monitor repeat CBC lactic acid in a.m. -Overall poor prognosis; appreciate palliative care involvement -We will discuss case with pulmonology  Laryngeal cancer with new metastatic disease: Patient status post platinum-based chemotherapy and radiation. -His CT abdomen pelvis on admission showed evidence of diffuse metastatic disease in the liver, lymph nodes of the stomach, rib. Suspect most likely metastatic laryngeal cancer. -Oncology consult  appreciated.  -images of his chest and soft tissue neck with concerns for recurrence malignancy and metastasis. -biopsy for tissue identification and confirmation needed -IR consulted to assist with US liver biopsy as an outpatient. -Appreciate assistance from palliative care;will follow recommendations.  Iron deficiency anemia-downtrending: -Continueoral iron daily -B12 normal -folate normal -Status post 2 units packed red blood cells on 05/27/2018 -no overt bleeding appreciated -Continue to follow hemoglobin trend intermittently with repeat CBC in am  Hypertension/hyperlipidemia: -Continue statins  Right lower extremity DVT: This was diagnosed several months ago. -Continuerivaroxaban -no signs of acute bleeding   COPD:  -Currently not wheezing, not on any treatments at home. -Continue DuoNeb  Pain/psych: -Continue gabapentin 300 mg 3 times daily -Continue fluoxetine 60 mg daily -Appreciate palliative recommendations with use of Fentanyl  Left upper extremity swelling -Ultrasound negative for DVT -Will keep limb elevated.  Acute on chronic renal failure: stage 3 at baseline -Appears to be associated with prerenal azotemia from dehydration and also the use of vancomycin -Nephrotoxic agents has been discontinued -Continue to encourage adequate hydration -Gentle IV fluids to continue at higher rate -Repeat basic metabolic panel in a.m.   DVT prophylaxis: Xarelto Code Status: DNR Family Communication: None at bedside Disposition Plan: Chest x-ray demonstrates multifocal pneumonia and patient continues to have high O2 requirements.  Palliative care following and recommendations for SNF/rehab noted with further need for hospice care likely.  Restart IV antibiotics today.   Consultants:   Oncology  Palliative care  Procedures:   None  Antimicrobials:   Cefepime and Flagyl on 11/7->11/14  Vancomycin 11/11->11/14  augmentin 11/14->11/17  Zosyn and  Vancomycin 11/21->   Subjective: Patient seen and evaluated today and appears to be more alert, but overall continues to have similar symptoms  with ongoing cough and congestion and appears to be in mild respiratory distress.  Continues to remain on 5 L nasal cannula with no acute overnight events noted.  Objective: Vitals:   06/07/18 2210 06/08/18 0526 06/08/18 0759 06/08/18 0800  BP: 127/86 121/75  103/66  Pulse: (!) 123 98  100  Resp: 20     Temp: 98.1 F (36.7 C) 98.2 F (36.8 C)  98.5 F (36.9 C)  TempSrc: Oral Oral  Axillary  SpO2: 97% 98% 92% 93%  Weight:      Height:        Intake/Output Summary (Last 24 hours) at 06/08/2018 1204 Last data filed at 06/08/2018 0343 Gross per 24 hour  Intake 907.55 ml  Output -  Net 907.55 ml   Filed Weights   06/11/2018 1401 06/03/2018 2216  Weight: 68.9 kg 70.5 kg    Examination:  General exam: Appears calm and comfortable  Respiratory system: Clear to auscultation. Respiratory effort normal.  Currently on 5 L nasal cannula. Cardiovascular system: S1 & S2 heard, RRR. No JVD, murmurs, rubs, gallops or clicks. No pedal edema. Gastrointestinal system: Abdomen is nondistended, soft and nontender. No organomegaly or masses felt. Normal bowel sounds heard. Central nervous system: Cannot adequately be assessed. Extremities: Symmetric 5 x 5 power. Skin: No rashes, lesions or ulcers Psychiatry: Cannot be adequately assessed.    Data Reviewed: I have personally reviewed following labs and imaging studies  CBC: Recent Labs  Lab 06/02/18 0747 06/05/18 0605 06/08/18 0510  WBC 17.9* 14.1* 14.4*  HGB 8.3* 8.6* 7.9*  HCT 27.1* 27.5* 26.2*  MCV 91.2 92.3 91.6  PLT 399 429* 347*   Basic Metabolic Panel: Recent Labs  Lab 06/02/18 0747 06/03/18 0628 06/05/18 0605 06/06/18 0451 06/07/18 0534 06/08/18 0510  NA 133*  --  134* 135 137 137  K 3.8  --  4.1 4.6 4.3 4.1  CL 107  --  109 110 112* 111  CO2 19*  --  18* 15* 19* 19*    GLUCOSE 94  --  103* 92 116* 105*  BUN 27*  --  39* 42* 47* 49*  CREATININE 1.55* 1.85* 2.07* 2.05* 2.13* 2.19*  CALCIUM 7.8*  --  7.9* 7.9* 7.5* 7.5*   GFR: Estimated Creatinine Clearance: 34.8 mL/min (A) (by C-G formula based on SCr of 2.19 mg/dL (H)). Liver Function Tests: No results for input(s): AST, ALT, ALKPHOS, BILITOT, PROT, ALBUMIN in the last 168 hours. No results for input(s): LIPASE, AMYLASE in the last 168 hours. No results for input(s): AMMONIA in the last 168 hours. Coagulation Profile: No results for input(s): INR, PROTIME in the last 168 hours. Cardiac Enzymes: No results for input(s): CKTOTAL, CKMB, CKMBINDEX, TROPONINI in the last 168 hours. BNP (last 3 results) No results for input(s): PROBNP in the last 8760 hours. HbA1C: No results for input(s): HGBA1C in the last 72 hours. CBG: No results for input(s): GLUCAP in the last 168 hours. Lipid Profile: No results for input(s): CHOL, HDL, LDLCALC, TRIG, CHOLHDL, LDLDIRECT in the last 72 hours. Thyroid Function Tests: No results for input(s): TSH, T4TOTAL, FREET4, T3FREE, THYROIDAB in the last 72 hours. Anemia Panel: No results for input(s): VITAMINB12, FOLATE, FERRITIN, TIBC, IRON, RETICCTPCT in the last 72 hours. Sepsis Labs: Recent Labs  Lab 06/08/18 1040  PROCALCITON 2.30    No results found for this or any previous visit (from the past 240 hour(s)).       Radiology Studies: Dg Chest [facility] 1 View  Result  Date: 06/07/2018 CLINICAL DATA:  Hypoxemia EXAM: PORTABLE CHEST 1 VIEW COMPARISON:  CT chest dated 06/01/2018 FINDINGS: Multifocal patchy opacities, suspicious for multifocal pneumonia, less likely moderate interstitial edema. Suspected small bilateral pleural effusions. No pneumothorax. Left chest port terminates in the lower SVC. Cardiomegaly. IMPRESSION: Multifocal patchy opacities, suspicious for multifocal pneumonia, less likely moderate interstitial edema. Suspected small bilateral pleural  effusions. Electronically Signed   By: Julian Hy M.D.   On: 06/07/2018 16:10        Scheduled Meds: . sodium chloride   Intravenous Once  . docusate sodium  100 mg Oral BID  . feeding supplement (ENSURE ENLIVE)  237 mL Oral BID BM  . ferrous sulfate  325 mg Oral Q breakfast  . FLUoxetine  60 mg Oral Daily  . gabapentin  300 mg Oral TID  . ipratropium-albuterol  3 mL Nebulization TID  . mometasone-formoterol  2 puff Inhalation BID  . OLANZapine  5 mg Oral QHS  . polyethylene glycol  17 g Oral Daily  . pravastatin  10 mg Oral q1800  . rivaroxaban  20 mg Oral Q supper  . sodium bicarbonate  650 mg Oral BID  . sucralfate  1 g Oral TID WC & HS   Continuous Infusions:   LOS: 14 days    Time spent: 30 minutes    Amoree Newlon Darleen Crocker, DO [facility] Pager [phone]  If 7PM-7AM, please contact night-coverage [email or web address] Password [facility] 06/08/2018, 12:04 PM

## 2018-06-08 NOTE — Progress Notes (Signed)
Daily Progress Note   Patient Name: Timothy Parks       Date: 06/08/2018 DOB: Dec 12, 1960  Age: 57 y.o. MRN#: [id number] Attending Physician: Rodena Goldmann, DO Primary Care Physician: Ginger Organ Admit Date: 06/03/2018  Reason for Consultation/Follow-up: Establishing goals of care  Subjective: Patient awake, alert but remains with pleasant confusion at times and hallucinations. Intermittently participates in [place] conversation but lacks capacity to make complex medical decisions.   GOC: Spent extensive time with patient and family this afternoon including mother [name]) and sister ([name]. I was with family from 1330-1500 and again from [phone] discussing goals of care, diagnoses (including worsening PNA and need for re-initiation of ABX), interventions, prognosis, and disposition options.   Speech therapist, Clyde Canterbury, and I discussed results from [place] in detail with family, explaining concern with high risk for aspiration.   [name] and Amy struggle with making medical decisions. They feel decisions are still up to [facility] but also seem to understand that he does not grasp complexity of medical decisions with ongoing confusion and hallucinations. After in depth discussion regarding MBS, aspiration risk, diet recommendations, and feeding tubes, the decision was made to continue comfort feeds understanding risk for aspiration and NO feeding tube. Amy shares she believes a feeding tube would be a "death sentence" for [name] and not help his quality of life. We also discussed this intervention a) not changing cancer, b) still risk for aspiration and c) concern with removal with ongoing confusion and agitation.   Patient's mother shares that she just wishes for "comfort" and "pain relief."  We further discussed transition to comfort measures for which [name] and Amy are resistant to the thought of antibiotics and IVF being discontinued. Discussed hospice philosophy and goal of allowing nature to take course and comfort, peace, dignity at EOL. [name] shares the misconception that if people are "killed" at hospice and food/drinks restricted. I educated on hospice misconceptions and explained disease trajectory of cancer and EOL expectations. Discussed poor prognosis with ongoing aspiration risk and high risk for respiratory decompensation. Explained role of treating symptoms with medications to ensure comfort and dignity at EOL.   Discussed hospice options. Patient tells [place] he does NOT want to go to hospice facility. Amy feels hospice facility would provided the best care. We discussed hospice at SNF also.  Family to consider options and transition to comfort measures only.   Lab stuck patient multiple times for blood cultures and unable to obtain labs. Family instructed lab to not stick [name] any more this evening.   Answered all questions and concerns for family. Discussed symptom management medications. Emotional support provided.   Length of Stay: 14  Current Medications: Scheduled Meds:  . sodium chloride   Intravenous Once  . docusate sodium  100 mg Oral BID  . feeding supplement (ENSURE ENLIVE)  237 mL Oral BID BM  . ferrous sulfate  325 mg Oral Q breakfast  . FLUoxetine  60 mg Oral Daily  . gabapentin  300 mg Oral TID  . ipratropium-albuterol  3 mL Nebulization TID  . mometasone-formoterol  2 puff Inhalation BID  . OLANZapine  5 mg Oral QHS  . polyethylene glycol  17 g Oral Daily  . pravastatin  10 mg Oral q1800  . rivaroxaban  20 mg Oral Q supper  . sodium bicarbonate  650 mg Oral BID  . sucralfate  1 g Oral TID WC & HS    Continuous Infusions: . sodium chloride 75 mL/hr at 06/08/18 1558  . fluconazole (DIFLUCAN) IV    . piperacillin-tazobactam (ZOSYN)  IV 3.375 g  (06/08/18 1557)  . [START ON 06/09/2018] vancomycin      PRN Meds: fentaNYL (SUBLIMAZE) injection, guaiFENesin-dextromethorphan, HYDROcodone-acetaminophen, ondansetron **OR** ondansetron (ZOFRAN) IV, sodium chloride  Physical Exam  Constitutional: He is cooperative. He appears ill.  HENT:  Head: Normocephalic and atraumatic.  Pulmonary/Chest: No accessory muscle usage. No tachypnea. No respiratory distress.  Abdominal: There is no tenderness.  Neurological: He is alert.  Periods of pleasant confusion. More awake, alert, and oriented compared to yesterday.  Skin: Skin is warm and dry.  Nursing note and vitals reviewed.          Vital Signs: BP 103/66 (BP Location: Left Arm)   Pulse 100   Temp 98.5 F (36.9 C) (Axillary)   Resp 20   Ht 5\' 7"  (1.702 m)   Wt 70.5 kg   SpO2 94%   BMI 24.34 kg/m  SpO2: SpO2: 94 % O2 Device: O2 Device: Nasal Cannula O2 Flow Rate: O2 Flow Rate (L/min): 5 L/min  Intake/output summary:   Intake/Output Summary (Last 24 hours) at 06/08/2018 2048 Last data filed at 06/08/2018 1700 Gross per 24 hour  Intake 1765.48 ml  Output -  Net 1765.48 ml   LBM: Last BM Date: 06/05/18 Baseline Weight: Weight: 68.9 kg Most recent weight: Weight: 70.5 kg       Palliative Assessment/Data: PPS 20%   Flowsheet Rows     Most Recent Value  Intake Tab  Referral Department  Hospitalist  Unit at Time of Referral  Med/Surg Unit  Palliative Care Primary Diagnosis  Cancer  Date Notified  06/06/18  Palliative Care Type  New Palliative care  Reason for referral  Clarify Goals of Care  Date of Admission  06/16/2018  Date first seen by Palliative Care  06/06/18  # of days Palliative referral response time  0 Day(s)  # of days IP prior to Palliative referral  12  Clinical Assessment  Palliative Performance Scale Score  20%  Psychosocial & Spiritual Assessment  Palliative Care Outcomes  Patient/Family meeting held?  Yes  Who was at the meeting?  patient, mother,  sister  Palliative Care Outcomes  Improved non-pain symptom therapy, Counseled regarding hospice, ACP counseling assistance, Provided psychosocial or spiritual support, Provided end of life  care assistance, Clarified goals of care      Patient Active Problem List   Diagnosis Date Noted  . Dysphagia   . Odynophagia   . Palliative care by specialist   . Weakness   . Pressure injury of skin 05/27/2018  . Sepsis ([place]) 06/14/2018  . Metastasis of unknown origin ([place]) 05/21/2018  . Hyponatremia 05/19/2018  . Diarrhea 06/06/2018  . Lactic acidosis 05/30/2018  . COPD with chronic bronchitis ([place]) 05/21/2018  . CKD (chronic kidney disease) stage 3, GFR 30-59 ml/min (HCC) 05/24/2018  . Elevated troponin 06/02/2018  . Normocytic anemia 06/12/2018  . Laryngeal cancer ([place]) 09/29/2017  . Goals of care, counseling/discussion 09/29/2017    Palliative Care Assessment & Plan   Patient Profile: 57 y.o. male  with past medical history of laryngeal cancer s/p chemo and radiation, DVT on Xarelto, HTN, COPD, anxiety, smoker, recovering ETOH abuse admitted on 05/30/2018 with diarrhea and weakness x3 weeks. CT ab/pelvis revealed new evidence of diffuse metastatic disease involving the liver, retroperitoneal lymph nodes, peritoneum, and left 8th and 10th ribs. Oncology following. CT chest/neck with concerns for recurrent malignancy and metastasis. Pending outpatient IR liver biopsy. Also found to have pneumonia currently afebrile and WBC's trending down. Patient has completed antibiotic therapy. Palliative medicine consultation for goals of care/advanced directive.   Assessment: Sepsis Pneumonia Laryngeal cancer Metastasis of unknown origin COPD with chronic bronchitis CKD stage 3 Dysphagia Weakness High risk for aspiration  Recommendations/Plan:  DNR/DNI  Patient continues to lack capacity to make complex medical decisions. Remains intermittently confused with hallucinations.   After extensive  discussion with patient's family after MBS results, decision has been made to focus on comfort feeds and NO feeding tube. Family understands high risk for continued aspiration and respiratory decompensation.   Family inconsistent with goals. They wish for "comfort" but resistant to hospice options and philosophy, understanding IVF and antibiotics are discontinued when comfort is the goal. Discussed at length hospice philosophy and options. Family considering hospice options, possibly SNF with hospice since patient/mother hesitant with thought of hospice facility. Will need further discussions. PMT provider not at [facility] over the weekend.   Start Zyprexa 5mg  PO HS.   Start Fluconazole 200mg  IV daily, ? Esophageal candidiasis  Continue prn pain regimen.  Code Status: DNR   Code Status Orders  (From admission, onward)         Start     Ordered   06/05/18 1652  Do not attempt resuscitation (DNR)  Continuous    Question Answer Comment  In the event of cardiac or respiratory ARREST Do not call a "code blue"   In the event of cardiac or respiratory ARREST Do not perform Intubation, CPR, defibrillation or ACLS   In the event of cardiac or respiratory ARREST Use medication by any route, position, wound care, and other measures to relive pain and suffering. May use oxygen, suction and manual treatment of airway obstruction as needed for comfort.      06/05/18 1652        Code Status History    Date Active Date Inactive Code Status Order ID Comments User Context   05/27/2018 2215 06/05/2018 1652 Full Code [id number]  Rise Patience, MD Inpatient   05/26/2018 2221 05/27/2018 2215 DNR [id number]  Mercy Riding, MD Inpatient       Prognosis:   Poor prognosis  Discharge Planning:  To Be Determined  Care plan was discussed with patient, Dr. Manuella Ghazi, RN, SLP mother, sister  Thank you  for allowing the Palliative Medicine Team to assist in the care of this patient.   Time In: 1330- 1530-  Time Out: 1610 9604 Total Time 135 Prolonged Time Billed yes      Greater than 50%  of this time was spent counseling and coordinating care related to the above assessment and plan.  Ihor Dow, FNP-C Palliative Medicine Team  Phone: [phone] Fax: [phone]  Please contact Palliative Medicine Team phone at [phone] for questions and concerns.

## 2018-06-08 NOTE — Evaluation (Addendum)
Modified Barium Swallow Progress Note  Patient Details  Name: Timothy Parks MRN: [id number] Date of Birth: 1961/05/03  Today's Date: 06/08/2018  Modified Barium Swallow completed.  Full report located under Chart Review in the Imaging Section.  Brief recommendations include the following:  Clinical Impression  Pt presents with severe sensorimotor oropharyngeal dysphagia characterized by penetration and eventual aspiration before, during and after the swallow of all textures presented (thin, NTL, HTL and puree). Oral phase with decreased bolus cohesion, decreased lingual propulsion and decreased base of tongue retraction. When increasing volume of thin liquids administered (tsp vs. cup sip) results in increased amount of penetration/aspiration with inconsistent reflexive cough. Nectar and Honey thick liquids with increased residue in the pharyngeal space with consistent penetration/aspiration followed by reflexive coughing and expectoration of residuals.  Decreased hyolaryngeal excursion and decreased duration and strength of pharyngeal squeeze resulting in mod/severe valleculae residue of puree textures that was not cleared despite multiple swallows but was eventually coughed up and expectorated. Visualized questionable radiation induced changes in anatomy with suspected thickening of posterior pharyngeal wall, epiglottis and arytenoids. Decreased laryngeal vestibule closure noted throughout study with penetrates entering below the epiglottis and below the arytnoids. Chin tuck was not effective in decreasing or eliminating penetration/aspiration. Pt is placed at [place] aspiration risk which was reviewed with family by this SLP and Palliative NP, Megan. Family understands the risks of aspiration and requests to continue with least restrictive diet rather than to pursue alternative means of nutrition at this time. In this case, recommend diet of thin liquids (water, beverages, ice cream, soup,  magic cup etc.) all liquids to be administered via tsp. Consider liberalizing diet to puree if Pt requests (considering comfort) however this would significantly increase aspiration risk secondary to significantly increased residuals with puree textures. Also note that diet will likely not meet nutritional needs which was also discussed with family. Pt should be sitting upright for all PO and should only eat when alert. Recommend oral care routine QID or more frequently if indicated.   Swallow Evaluation Recommendations       SLP Diet Recommendations: Thin liquid;No solids, see liquids   Liquid Administration via: Cup;Spoon   Medication Administration: Via alternative means   Supervision: Full supervision/cueing for compensatory strategies;Staff to assist with self feeding;Full assist for feeding   Compensations: Minimize environmental distractions;Slow rate;Small sips/bites;Hard cough after swallow;Clear throat after each swallow;Multiple dry swallows after each bite/sip(tsp sips/bites only)   Postural Changes: Remain semi-upright after after feeds/meals (Comment);Seated upright at 90 degrees   Oral Care Recommendations: Oral care before and after PO      Alexandr Oehler H. Roddie Mc, [place] Speech Language Pathologist   Wende Bushy 06/08/2018,6:07 PM

## 2018-06-08 NOTE — Progress Notes (Signed)
Physical Therapy Treatment Patient Details Name: Timothy Parks MRN: [id number] DOB: 22-Sep-1960 Today's Date: 06/08/2018    History of Present Illness Timothy Parks is a 57 y.o. 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    PT Comments    Patient presents more alert and responds normally to most questions/directions.  Patient demonstrates slow labored movement for sitting up at bedside requiring constant verbal/tactile cueing, requires active assistance to complete BLE exercises against gravity, very unsteady on feet and limited to taking steps at bedside, able to transfer to [facility] for a bowel movement and later took a few labored slow steps to transfer to chair.  Patient tolerated sitting up in chair after therapy.  Patient will benefit from continued physical therapy in hospital and recommended venue below to increase strength, balance, endurance for safe ADLs and gait.   Follow Up Recommendations  SNF     Equipment Recommendations  None recommended by PT    Recommendations for Other Services       Precautions / Restrictions Precautions Precautions: Fall Restrictions Weight Bearing Restrictions: No    Mobility  Bed Mobility Overal bed mobility: Needs Assistance Bed Mobility: Supine to Sit     Supine to sit: Min assist;Mod assist     General bed mobility comments: slow labored movement with frequent verbal/tactile cueing to follow directions  Transfers Overall transfer level: Needs assistance Equipment used: Rolling walker (2 wheeled);1 person hand held assist Transfers: Sit to/from [facility] Sit to Stand: Min assist;Mod assist Stand pivot transfers: Mod assist       General transfer comment: slow unsteady movement with buckling of knees due to  weakness  Ambulation/Gait Ambulation/Gait assistance: Mod assist;Max assist Gait Distance (Feet): 4 Feet Assistive device: Rolling walker (2 wheeled) Gait Pattern/deviations: Decreased step length - right;Decreased step length - left;Decreased stride length Gait velocity: slow   General Gait Details: limited to 6-7 slow labored side steps at bedside during transfers to [facility] and chair due to BLE weakness, poor standing balance   Stairs             Wheelchair Mobility    Modified Rankin (Stroke Patients Only)       Balance Overall balance assessment: Needs assistance Sitting-balance support: Feet supported;No upper extremity supported Sitting balance-Leahy Scale: Fair     Standing balance support: Bilateral upper extremity supported;During functional activity Standing balance-Leahy Scale: Poor Standing balance comment: fair/poor using RW                            Cognition Arousal/Alertness: Awake/alert Behavior During Therapy: WFL for tasks assessed/performed Overall Cognitive Status: Within Functional Limits for tasks assessed                                        Exercises General Exercises - Lower Extremity Long Arc Quad: Seated;AROM;AAROM;Strengthening;Both;10 reps Hip Flexion/Marching: Seated;AROM;Strengthening;AAROM;Both;10 reps Toe Raises: Seated;AROM;Strengthening;Both;10 reps Heel Raises: Seated;AROM;Strengthening;Both;10 reps    General Comments        Pertinent Vitals/Pain Pain Assessment: Faces Faces Pain Scale: Hurts a little bit Pain Location: right side rib cage Pain Descriptors / Indicators: Aching;Discomfort Pain Intervention(s): Limited activity within patient's tolerance;Monitored during session    Home  Living                      Prior Function            PT Goals (current goals can now be found in the care plan section) Acute Rehab PT Goals Patient Stated Goal: return home after rehab PT  Goal Formulation: With patient/family Time For Goal Achievement: 06/16/18 Potential to Achieve Goals: Good Progress towards PT goals: Progressing toward goals    Frequency    Min 3X/week      PT Plan Current plan remains appropriate    Co-evaluation              AM-PAC PT "6 Clicks" Daily Activity  Outcome Measure  Difficulty turning over in bed (including adjusting bedclothes, sheets and blankets)?: Unable Difficulty moving from lying on back to sitting on the side of the bed? : Unable Difficulty sitting down on and standing up from a chair with arms (e.g., wheelchair, bedside commode, etc,.)?: Unable Help needed moving to and from a bed to chair (including a wheelchair)?: A Lot Help needed walking in hospital room?: A Lot Help needed climbing 3-5 steps with a railing? : A Lot 6 Click Score: 9    End of Session Equipment Utilized During Treatment: Oxygen Activity Tolerance: Patient tolerated treatment well;Patient limited by fatigue Patient left: in chair;with call bell/phone within reach;with chair alarm set;with family/visitor present Nurse Communication: Mobility status PT Visit Diagnosis: Unsteadiness on feet (R26.81);Other abnormalities of gait and mobility (R26.89);Muscle weakness (generalized) (M62.81)     Time: 0177-9390 PT Time Calculation (min) (ACUTE ONLY): 34 min  Charges:  $Therapeutic Exercise: 8-22 mins $Therapeutic Activity: 8-22 mins                     3:13 PM, 06/08/18 Lonell Grandchild, MPT Physical Therapist with [facility] [phone] office [phone] mobile phone

## 2018-06-08 NOTE — Progress Notes (Signed)
Pharmacy Antibiotic Note  Timothy Parks is a 57 y.o. male admitted on 06/04/2018 with pneumonia (HCAP).  Pharmacy has been consulted for Vancomycin and zosyn dosing.  Plan: Vancomycin 1500mg  loading dose, then 1000mg  IV every 24 hours.  Goal trough 15-20 mcg/mL.  Zosyn 3.375g IV q8h EID over 4 hours F/U cxs and clinical progress Monitor V/S, labs, and levels as indicated  Height: 5\' 7"  (170.2 cm) Weight: 155 lb 6.8 oz (70.5 kg) IBW/kg (Calculated) : 66.1  Temp (24hrs), Avg:98.3 F (36.8 C), Min:98.1 F (36.7 C), Max:98.5 F (36.9 C)  Recent Labs  Lab 06/02/18 0747 06/03/18 0628 06/05/18 0605 06/06/18 0451 06/07/18 0534 06/08/18 0510  WBC 17.9*  --  14.1*  --   --  14.4*  CREATININE 1.55* 1.85* 2.07* 2.05* 2.13* 2.19*    Estimated Creatinine Clearance: 34.8 mL/min (A) (by C-G formula based on SCr of 2.19 mg/dL (H)).    Allergies  Allergen Reactions  . Tetracyclines & Related Anxiety    Antimicrobials this admission: Vancomycin 11/11>>11/14  Restarted 11/21>> Zosyn 11/21>> Cefepime 11/7 >> 11/14 Flagyl 11/7 >> 11/14 Vancomycin and Zosyn 11/7 x 1 Augmentin 11/7>> 11/18   Dose adjustments this admission:  11/9 Increased cefepime to 2gm IV q8h   Microbiology results:  11/7 BCx: ng 11/11 BCx: ng x5 days 11/12 fungus culture: pending  Thank you for allowing pharmacy to be a part of this patient's care.  Isac Sarna, BS Pharm D, [place] Clinical Pharmacist Pager [phone] 06/08/2018 11:54 AM

## 2018-06-08 NOTE — Evaluation (Signed)
Clinical/Bedside Swallow Evaluation Patient Details  Name: Timothy Parks MRN: [id number] Date of Birth: [birth year]  Today's Date: 06/08/2018 Time: SLP Start Time (ACUTE ONLY): 55 SLP Stop Time (ACUTE ONLY): 1146 SLP Time Calculation (min) (ACUTE ONLY): 41 min  Past Medical History:  Past Medical History:  Diagnosis Date  . Anxiety   . Cancer ([place])    laryngeal  . COPD (chronic obstructive pulmonary disease) ([place])    pt states he was told he has COPD by health department  . Hypertension   . 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 BLADDER SURGERY    . MICROLARYNGOSCOPY N/A 09/20/2017   Procedure: MICRO LARYNGOSCOPY WITH BIOPSY OF LARYNGEAL MASS;  Surgeon: Leta Baptist, MD;  Location: [facility];  Service: ENT;  Laterality: N/A;  . PORTACATH PLACEMENT Left 10/21/2017   Procedure: INSERTION PORT-A-CATH;  Surgeon: Virl Cagey, MD;  Location: [facility];  Service: General;  Laterality: Left;   HPI:  Timothy Parks is a 57 y.o. male with medical history significant for laryngeal cancer status post chemo and radiation, COPD, hypertension, anxiety, DVT on Xarelto and tobacco use disorder who presented to ED on 11/7 with 3 weeks of diarrhea and weakness. RD reports Pt has experienced a decline in his swallowing function since initial consult (11/8) after admission during this acute stay; CXR 11/20 reveals: Multifocal patchy opacities, suspicious for multifocal pneumonia, less likely moderate interstitial edema.   Assessment / Plan / Recommendation Clinical Impression  Pt was repositioned in bed and presents with hallucinations and confusion this date. Oral mech reveals xerostomia and black dried secretions covering the lingual surface; Lingual surface was cleansed with toothettes and thorough oral care provided. Pt's sister, Amy was present for evaluation. Pt presented today with  significantly worsening signs and symptoms of oropharyngeal dysphagia (since initial consult 05/26/2018) including multiple swallows of every presentation of thin liquids and puree textures, and overt coughing after every sip and bite. Question esophageal componant secondary to frequent coughing up and expectoration of mucus and secretions; some secretions expectorated during this eval were black in color. At this time, recommend NPO secondary to overt s/sx of aspiration and dysphagia as noted above pending determination of goals of care and possible instrumental testing to objectively assess the swallowing function. ST will f/u later this date to communicate with Palliative (after scheduled meeting with Pt and family) to schedule MBS or possibly liberalize diet in the case of hospice/comfort care direction.  SLP Visit Diagnosis: Dysphagia, oropharyngeal phase (R13.12)    Aspiration Risk  Severe aspiration risk    Diet Recommendation NPO        Other  Recommendations Recommended Consults: Consider GI evaluation   Follow up Recommendations        Frequency and Duration min 2x/week  2 weeks       Prognosis Prognosis for Safe Diet Advancement: Fair Barriers to Reach Goals: Severity of deficits      Swallow Study   General Date of Onset: 05/30/2018 HPI: Timothy Parks is a 57 y.o. male with medical history significant for laryngeal cancer status post chemo and radiation, COPD, hypertension, anxiety, DVT on Xarelto and tobacco use disorder who presented to ED on 11/7 with 3 weeks of diarrhea and weakness. RD reports Pt has experienced a decline in his swallowing function since initial consult (11/8) after admission during this acute stay; CXR 11/20 reveals: Multifocal patchy opacities, suspicious for multifocal  pneumonia, less likely moderate interstitial edema. Type of Study: Bedside Swallow Evaluation Previous Swallow Assessment: BSE 05/26/18 Diet Prior to this Study: Dysphagia 3  (soft);Thin liquids Temperature Spikes Noted: No Respiratory Status: Nasal cannula(5 L/mn) History of Recent Intubation: No Behavior/Cognition: Confused;Agitated;Distractible;Requires cueing Oral Cavity Assessment: Dried secretions;Excessive secretions;Dry Oral Care Completed by SLP: Yes Oral Cavity - Dentition: Missing dentition Vision: Functional for self-feeding Self-Feeding Abilities: Able to feed self Patient Positioning: Upright in bed Baseline Vocal Quality: Hoarse Volitional Cough: Strong Volitional Swallow: Able to elicit    Oral/Motor/Sensory Function Overall Oral Motor/Sensory Function: Within functional limits   Ice Chips Ice chips: Impaired Presentation: Spoon Pharyngeal Phase Impairments: Cough - Immediate;Cough - Delayed;Multiple swallows;Wet Vocal Quality   Thin Liquid Thin Liquid: Impaired Presentation: Cup Oral Phase Impairments: Poor awareness of bolus Pharyngeal  Phase Impairments: Cough - Immediate;Cough - Delayed;Wet Vocal Quality;Multiple swallows;Throat Clearing - Delayed    Nectar Thick Nectar Thick Liquid: Not tested   Honey Thick     Puree Puree: Impaired Presentation: Spoon Oral Phase Impairments: Poor awareness of bolus Pharyngeal Phase Impairments: Throat Clearing - Delayed;Cough - Delayed;Multiple swallows   Solid    Senaya Dicenso H. Roddie Mc, CCC-SLP Speech Language Pathologist  Solid: Not tested      Wende Bushy 06/08/2018,12:06 PM

## 2018-06-09 DIAGNOSIS — E785 Hyperlipidemia, unspecified: Secondary | ICD-10-CM | POA: Diagnosis present

## 2018-06-09 DIAGNOSIS — Y842 Radiological procedure and radiotherapy as the cause of abnormal reaction of the patient, or of later complication, without mention of misadventure at the time of the procedure: Secondary | ICD-10-CM | POA: Diagnosis present

## 2018-06-09 DIAGNOSIS — N179 Acute kidney failure, unspecified: Secondary | ICD-10-CM | POA: Diagnosis present

## 2018-06-09 DIAGNOSIS — C7972 Secondary malignant neoplasm of left adrenal gland: Secondary | ICD-10-CM | POA: Diagnosis present

## 2018-06-09 DIAGNOSIS — C779 Secondary and unspecified malignant neoplasm of lymph node, unspecified: Secondary | ICD-10-CM | POA: Diagnosis present

## 2018-06-09 DIAGNOSIS — E872 Acidosis: Secondary | ICD-10-CM | POA: Diagnosis present

## 2018-06-09 DIAGNOSIS — D509 Iron deficiency anemia, unspecified: Secondary | ICD-10-CM | POA: Diagnosis present

## 2018-06-09 DIAGNOSIS — J9601 Acute respiratory failure with hypoxia: Secondary | ICD-10-CM | POA: Diagnosis present

## 2018-06-09 DIAGNOSIS — B3781 Candidal esophagitis: Secondary | ICD-10-CM | POA: Diagnosis present

## 2018-06-09 DIAGNOSIS — E871 Hypo-osmolality and hyponatremia: Secondary | ICD-10-CM | POA: Diagnosis present

## 2018-06-09 DIAGNOSIS — C786 Secondary malignant neoplasm of retroperitoneum and peritoneum: Secondary | ICD-10-CM | POA: Diagnosis present

## 2018-06-09 DIAGNOSIS — I82401 Acute embolism and thrombosis of unspecified deep veins of right lower extremity: Secondary | ICD-10-CM | POA: Diagnosis present

## 2018-06-09 DIAGNOSIS — C787 Secondary malignant neoplasm of liver and intrahepatic bile duct: Secondary | ICD-10-CM | POA: Diagnosis present

## 2018-06-09 DIAGNOSIS — E43 Unspecified severe protein-calorie malnutrition: Secondary | ICD-10-CM | POA: Diagnosis present

## 2018-06-09 DIAGNOSIS — A419 Sepsis, unspecified organism: Secondary | ICD-10-CM | POA: Diagnosis present

## 2018-06-09 DIAGNOSIS — Z515 Encounter for palliative care: Secondary | ICD-10-CM | POA: Diagnosis present

## 2018-06-09 DIAGNOSIS — C329 Malignant neoplasm of larynx, unspecified: Secondary | ICD-10-CM | POA: Diagnosis present

## 2018-06-09 DIAGNOSIS — J690 Pneumonitis due to inhalation of food and vomit: Secondary | ICD-10-CM | POA: Diagnosis present

## 2018-06-09 DIAGNOSIS — Z66 Do not resuscitate: Secondary | ICD-10-CM | POA: Diagnosis present

## 2018-06-09 LAB — BASIC METABOLIC PANEL
Anion gap: 6 (ref 5–15)
BUN: 54 mg/dL — AB (ref 6–20)
CO2: 18 mmol/L — ABNORMAL LOW (ref 22–32)
Calcium: 7.4 mg/dL — ABNORMAL LOW (ref 8.9–10.3)
Chloride: 115 mmol/L — ABNORMAL HIGH (ref 98–111)
Creatinine, Ser: 2.29 mg/dL — ABNORMAL HIGH (ref 0.61–1.24)
GFR calc Af Amer: 35 mL/min — ABNORMAL LOW (ref >60)
GFR, EST NON AFRICAN AMERICAN: 30 mL/min — AB (ref >60)
GLUCOSE: 112 mg/dL — AB (ref 70–99)
POTASSIUM: 4 mmol/L (ref 3.5–5.1)
Sodium: 139 mmol/L (ref 135–145)

## 2018-06-09 LAB — LACTIC ACID, PLASMA: Lactic Acid, Venous: 1.2 mmol/L (ref 0.5–1.9)

## 2018-06-09 LAB — CBC
HCT: 25.1 % — ABNORMAL LOW (ref 39.0–52.0)
Hemoglobin: 7.5 g/dL — ABNORMAL LOW (ref 13.0–17.0)
MCH: 27.4 pg (ref 26.0–34.0)
MCHC: 29.9 g/dL — AB (ref 30.0–36.0)
MCV: 91.6 fL (ref 80.0–100.0)
PLATELETS: 427 10*3/uL — AB (ref 150–400)
RBC: 2.74 MIL/uL — AB (ref 4.22–5.81)
RDW: 17 % — AB (ref 11.5–15.5)
WBC: 16.9 10*3/uL — ABNORMAL HIGH (ref 4.0–10.5)
nRBC: 0 % (ref 0.0–0.2)

## 2018-06-09 MED ORDER — FENTANYL CITRATE (PF) 100 MCG/2ML IJ SOLN
25.0000 ug | INTRAMUSCULAR | Status: DC | PRN
  Administered 2018-06-09 (×2): 50 ug via INTRAVENOUS
  Administered 2018-06-09: 25 ug via INTRAVENOUS
  Administered 2018-06-10 (×3): 50 ug via INTRAVENOUS
  Filled 2018-06-09 (×6): qty 2

## 2018-06-09 MED ORDER — LORAZEPAM 2 MG/ML IJ SOLN
1.0000 mg | INTRAMUSCULAR | Status: DC | PRN
  Administered 2018-06-10 (×3): 1 mg via INTRAVENOUS
  Filled 2018-06-09 (×3): qty 1

## 2018-06-09 NOTE — Care Management (Signed)
Patient Information  Ss# [id number]  Patient Name Timothy Parks, Timothy Parks ([id number]) Sex Male DOB 01-26-1961  Room Bed  A327 A327-01  Patient Demographics   Address [address] [place] [address] Phone [phone] (Home) [phone] (Mobile)  Patient Ethnicity & Race   Ethnic Group Patient Race  Not Hispanic or Latino White or Caucasian  Emergency Contact(s)   Name Relation Home Work Mobile  [facility] Mother [phone]  Lajean Silvius   [phone]  Documents on File    Status Date Received Description  Documents for the Patient  [place] Received 02/21/11 Unable to sign due to injury  [facility] E-Signature HIPAA Notice of Privacy Signed 09/20/17   [place] E-Signature HIPAA Notice of Privacy Spanish     Driver's License Not Received    Insurance Card Received 09/12/17 MCD  Advance Directives/Living Will/HCPOA/POA Not Received    Financial Application Not Received    [place]  02/22/11   HIM ROI Authorization  08/03/13   Release of Information  08/06/13   Other Photo ID Received 09/16/17 04/2021  Release of Information Received 26/37/85 RGA  Driver's License Received 88/50/27 [place] EXP 05/09/2021  Advanced Beneficiary Notice (ABN) Not Received    E-Signature AOB Spanish Not Received    Insurance Card Received 10/11/17 Insurance card [facility]  AMB Correspondence Received 09/12/17 OFFICE VISIT TEOH MD,S  HIM ROI Authorization  03/14/18 [facility] [place] HEAD AND NECK STUDY  AMB Outside Hospital Record Received 12/19/17 RADIATION TREATMENT COMPLETION NOTE [facility] CANCER  Patient Photo   Photo of Patient  HIM Release of Information Output (Deleted) 03/23/18 Requested records  Documents for the Encounter  AOB (Assignment of Insurance Benefits) Not Received    E-signature AOB Signed 06/09/2018   MEDICARE RIGHTS Not Received    E-signature Medicare  Rights     ED Patient Billing Extract   ED PB Billing Extract  Cardiac Monitoring Strip Shift Summary Received 06/07/2018   Cardiac Monitoring Strip Received 06/03/2018   Ultrasound Received 06/01/18   Admission Information   Attending Provider Admitting Provider Admission Type Admission Date/Time  Rodena Goldmann, DO Mercy Riding, MD Emergency 05/29/2018 1404  Discharge Date Hospital Service Auth/Cert Status Service Area   Internal Medicine Incomplete [facility]  Unit Room/Bed Admission Status   [facility]-DEPT 300 A327/A327-01 Admission (Confirmed)   Admission   Complaint  Sent by Dr. Gerarda Fraction, Dehydration Hypotension  Hospital Account   Name Acct ID Class Status Primary Coverage  [place], [place] [id number] Inpatient Open MEDICAID [place]      Guarantor Account (for Hospital Account [id number])   Name Relation to [place]? Acct Type  Unknown Timothy Parks Self CHSA Yes Personal/Family  Address Phone    [address] [place], [place] [address] [phone])        Coverage Information (for Hospital Account [id number])   F/O Payor/Plan Precert #  MEDICAID [place]/MEDICAID [place] ACCESS   Subscriber Subscriber #  [place], [place] [id number]  Address Phone  [address] [place], [place] [address] [phone]       Care Everywhere ID:  [id number]

## 2018-06-09 NOTE — Progress Notes (Signed)
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  Procalcitonin continues to remain elevated and suggests the need for further IV antibiotic use.  He continues to have recurrent issues with aspiration and antibiotics do not appear to be helping.  He has been transitioned to comfort care.   Assessment & Plan:   Principal Problem:   Sepsis ([place]) Active Problems:   Laryngeal cancer ([place])   Metastasis of unknown origin ([place])   Hyponatremia   Diarrhea   Lactic acidosis   COPD with chronic bronchitis (HCC)   CKD (chronic kidney disease) stage 3, GFR 30-59 ml/min (HCC)   Elevated troponin   Normocytic anemia   Pressure injury of skin   Palliative care by specialist   Weakness   Dysphagia   Odynophagia   Hypoxemia   At high risk for aspiration   Esophageal candidiasis ([place])   1. Worsening hypoxemic respiratory failure related to worsening multifocal pneumonia with persistent aspiration.  Discontinue antibiotics at this time and placed on comfort measures as discussed with family members.  Will focus on comfort care only with use of morphine and Ativan as needed. 2. Laryngeal cancer with new metastatic disease.  No further treatment for now as he is comfort measures.  Oncology consult appreciated. 3. Worsening AKI on stage III CKD. 4. Right lower extremity DVT.  Discontinue rivaroxaban as patient cannot  tolerate p.o. and is now comfort measures. 5. Iron deficiency anemia-downtrending.  No further lab work indicated at this time.  No overt bleeding appreciated. 6. COPD.  No signs of active wheezing currently noted will administer breathing treatments as needed.   DVT prophylaxis: None Code Status: DNR, comfort measures Family Communication: Spoke with sister and mother at bedside Disposition Plan: Comfort measures only   Consultants:   Oncology  Palliative care  Procedures:   None  Antimicrobials:   Cefepime and Flagyl on 11/7->11/14  Vancomycin 11/11->11/14  augmentin 11/14->11/17  Zosyn and Vancomycin 11/21->11/22   Subjective: Patient seen and evaluated today with ongoing respiratory issues and some mild discomfort noted.  Spoke with sister and mother at bedside with plans to place and comfort measures and look for hospice facility.  Objective: Vitals:   06/08/18 2200 06/09/18 0635 06/09/18 0806 06/09/18 0819  BP: (!) 140/100 131/73    Pulse:  (!) 112    Resp:      Temp:  97.8 F (36.6 C)    TempSrc:  Oral    SpO2:  100% 100% 100%  Weight:      Height:        Intake/Output Summary (Last 24 hours) at 06/09/2018 1314 Last data filed at 06/09/2018 0600 Gross per 24 hour  Intake 1333.28 ml  Output -  Net 1333.28 ml   Filed Weights   06/12/2018 1401 05/22/2018 2216  Weight: 68.9 kg 70.5 kg    Examination:  General exam: Appears calm and mildly uncomfortable Respiratory system: Clear to auscultation. Respiratory effort normal.  On 5 L nasal cannula Cardiovascular system: S1 & S2 heard, RRR.  No JVD, murmurs, rubs, gallops or clicks. No pedal edema. Gastrointestinal system: Abdomen is nondistended, soft and nontender. No organomegaly or masses felt. Normal bowel sounds heard. Central nervous system: Somnolent Extremities: Symmetric 5 x 5 power. Skin: No rashes, lesions or ulcers Psychiatry: Cannot be assessed.    Data Reviewed: I have personally  reviewed following labs and imaging studies  CBC: Recent Labs  Lab 06/05/18 0605 06/08/18 0510 06/09/18 0459  WBC 14.1* 14.4* 16.9*  HGB 8.6* 7.9* 7.5*  HCT 27.5* 26.2* 25.1*  MCV 92.3 91.6 91.6  PLT 429* 443* 194*   Basic Metabolic Panel: Recent Labs  Lab 06/05/18 0605 06/06/18 0451 06/07/18 0534 06/08/18 0510 06/09/18 0459  NA 134* 135 137 137 139  K 4.1 4.6 4.3 4.1 4.0  CL 109 110 112* 111 115*  CO2 18* 15* 19* 19* 18*  GLUCOSE 103* 92 116* 105* 112*  BUN 39* 42* 47* 49* 54*  CREATININE 2.07* 2.05* 2.13* 2.19* 2.29*  CALCIUM 7.9* 7.9* 7.5* 7.5* 7.4*   GFR: Estimated Creatinine Clearance: 33.3 mL/min (A) (by C-G formula based on SCr of 2.29 mg/dL (H)). Liver Function Tests: No results for input(s): AST, ALT, ALKPHOS, BILITOT, PROT, ALBUMIN in the last 168 hours. No results for input(s): LIPASE, AMYLASE in the last 168 hours. No results for input(s): AMMONIA in the last 168 hours. Coagulation Profile: No results for input(s): INR, PROTIME in the last 168 hours. Cardiac Enzymes: No results for input(s): CKTOTAL, CKMB, CKMBINDEX, TROPONINI in the last 168 hours. BNP (last 3 results) No results for input(s): PROBNP in the last 8760 hours. HbA1C: No results for input(s): HGBA1C in the last 72 hours. CBG: No results for input(s): GLUCAP in the last 168 hours. Lipid Profile: No results for input(s): CHOL, HDL, LDLCALC, TRIG, CHOLHDL, LDLDIRECT in the last 72 hours. Thyroid Function Tests: No results for input(s): TSH, T4TOTAL, FREET4, T3FREE, THYROIDAB in the last 72 hours. Anemia Panel: No results for input(s): VITAMINB12, FOLATE, FERRITIN, TIBC, IRON, RETICCTPCT in the last 72 hours. Sepsis Labs: Recent Labs  Lab 06/08/18 1040 06/09/18 0459  PROCALCITON 2.30  --   LATICACIDVEN  --  1.2    No results found for this or any previous visit (from the past 240 hour(s)).       Radiology Studies: Dg Chest Port 1 View  Result Date: 06/07/2018 CLINICAL DATA:   Hypoxemia EXAM: PORTABLE CHEST 1 VIEW COMPARISON:  CT chest dated 06/01/2018 FINDINGS: Multifocal patchy opacities, suspicious for multifocal pneumonia, less likely moderate interstitial edema. Suspected small bilateral pleural effusions. No pneumothorax. Left chest port terminates in the lower SVC. Cardiomegaly. IMPRESSION: Multifocal patchy opacities, suspicious for multifocal pneumonia, less likely moderate interstitial edema. Suspected small bilateral pleural effusions. Electronically Signed   By: Julian Hy M.D.   On: 06/07/2018 16:10   Dg Swallowing Func-speech Pathology  Result Date: 06/09/2018 Objective Swallowing Evaluation: Type of Study: MBS-Modified Barium Swallow Study  Patient Details Name: Timothy Parks MRN: [id number] Date of Birth: 1961/04/01 Today's Date: 06/09/2018 Time: SLP Start Time (ACUTE ONLY): 1415 -SLP Stop Time (ACUTE ONLY): 1437 SLP Time Calculation (min) (ACUTE ONLY): 22 min Past Medical History: Past Medical History: Diagnosis Date . Anxiety  . Cancer ([place])   laryngeal . COPD (chronic obstructive pulmonary disease) ([place])   pt states he was told he has COPD by health department . Hypertension  . 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)  Past Surgical History: Past Surgical History: Procedure Laterality Date . BLADDER SURGERY   . MICROLARYNGOSCOPY N/A 09/20/2017  Procedure: MICRO LARYNGOSCOPY WITH BIOPSY OF LARYNGEAL MASS;  Surgeon: Leta Baptist, MD;  Location: [place];  Service: ENT;  Laterality: N/A; . PORTACATH PLACEMENT Left 10/21/2017  Procedure: INSERTION PORT-A-CATH;  Surgeon: Virl Cagey, MD;  Location: [facility];  Service: General;  Laterality: Left; HPI: Timothy Parks is a 57 y.o. male with medical history significant for laryngeal cancer status post chemo and radiation, COPD, hypertension, anxiety, DVT on Xarelto and tobacco use disorder who presented to ED on 11/7 with 3 weeks of diarrhea and weakness. RD reports Pt has experienced a decline  in his swallowing function since initial consult (11/8) after admission during this acute stay; CXR 11/20 reveals: Multifocal patchy opacities, suspicious for multifocal pneumonia, less likely moderate interstitial edema. BSE indicating need for MBS  No data recorded Assessment / Plan / Recommendation CHL IP CLINICAL IMPRESSIONS 06/09/2018 Clinical Impression Pt presents with severe sensorimotor oropharyngeal dysphagia characterized by penetration and eventual aspiration before, during and after the swallow of all textures presented (thin, NTL, HTL and puree). Oral phase with decreased bolus cohesion, decreased lingual propulsion and decreased base of tongue retraction. When increasing volume of thin liquids administered (tsp vs. cup sip) results in increased amount of penetration/aspiration with inconsistent reflexive cough. Nectar and Honey thick liquids with increased residue in the pharyngeal space with consistent penetration/aspiration followed by reflexive coughing and expectoration of residuals.  Decreased hyolaryngeal excursion and decreased duration and strength of pharyngeal squeeze resulting in mod/severe valleculae residue of puree textures that was not cleared despite multiple swallows but was eventually coughed up and expectorated. Visualized questionable radiation induced changes in anatomy with suspected thickening of posterior pharyngeal wall, epiglottis and arytenoids. Decreased laryngeal vestibule closure noted throughout study with penetrates entering below the epiglottis and below the arytnoids. Chin tuck was not effective in decreasing or eliminating penetration/aspiration. Pt is placed at [place] aspiration risk which was reviewed with family by this SLP and Palliative NP, Megan. Family understands the risks of aspiration and requests to continue with least restrictive diet rather than to pursue alternative means of nutrition at this time. In this case, recommend diet of thin liquids (water,  beverages, ice cream, soup, magic cup etc.) all liquids to be administered via tsp. Consider liberalizing diet to puree if Pt requests (considering comfort) however this would significantly increase aspiration risk secondary to significantly increased residuals with puree textures. Also note that diet will likely not meet nutritional needs which was also discussed with family. Pt should be sitting upright for all PO and should only eat when alert. Recommend oral care routine QID or more frequently if indicated. SLP Visit Diagnosis Dysphagia, oropharyngeal phase (R13.12) Attention and concentration deficit following -- Frontal lobe and executive function deficit following -- Impact on safety and function Severe aspiration risk   CHL IP TREATMENT RECOMMENDATION 06/09/2018 Treatment Recommendations Therapy as outlined in treatment plan below   Prognosis 06/09/2018 Prognosis for Safe Diet Advancement Guarded Barriers to Reach Goals Severity of deficits Barriers/Prognosis Comment -- CHL IP DIET RECOMMENDATION 06/09/2018 SLP Diet Recommendations Thin liquid;No solids, see liquids Liquid Administration via -- Medication Administration Via alternative means Compensations Minimize environmental distractions;Slow rate;Small sips/bites;Hard cough after swallow;Clear throat after each swallow;Multiple dry swallows after each bite/sip Postural Changes --   CHL IP OTHER RECOMMENDATIONS 06/09/2018 Recommended Consults -- Oral Care Recommendations Oral care before and after PO Other Recommendations --   CHL IP FOLLOW UP RECOMMENDATIONS 06/09/2018 Follow up Recommendations 24 hour supervision/assistance;Skilled Nursing facility   [facility] IP  FREQUENCY AND DURATION 06/09/2018 Speech Therapy Frequency (ACUTE ONLY) min 1 x/week Treatment Duration 1 week      CHL IP ORAL PHASE 06/08/2018 Oral Phase Impaired Oral - Pudding Teaspoon -- Oral - Pudding Cup -- Oral - Honey Teaspoon Decreased bolus cohesion;Piecemeal swallowing;Reduced posterior  propulsion Oral - Honey Cup -- Oral - Nectar Teaspoon Decreased bolus cohesion;Piecemeal swallowing Oral - Nectar Cup Piecemeal swallowing;Decreased bolus cohesion Oral - Nectar Straw -- Oral - Thin Teaspoon Piecemeal swallowing;Premature spillage;Delayed oral transit Oral - Thin Cup Piecemeal swallowing;Delayed oral transit;Premature spillage Oral - Thin Straw -- Oral - Puree Premature spillage;Delayed oral transit;Piecemeal swallowing;Lingual/palatal residue Oral - Mech Soft -- Oral - Regular -- Oral - Multi-Consistency -- Oral - Pill -- Oral Phase - Comment --  CHL IP PHARYNGEAL PHASE 06/08/2018 Pharyngeal Phase Impaired Pharyngeal- Pudding Teaspoon -- Pharyngeal -- Pharyngeal- Pudding Cup -- Pharyngeal -- Pharyngeal- Honey Teaspoon Delayed swallow initiation-vallecula;Reduced airway/laryngeal closure;Trace aspiration;Pharyngeal residue - valleculae;Pharyngeal residue - pyriform;Pharyngeal residue - posterior pharnyx;Inter-arytenoid space residue;Reduced epiglottic inversion;Reduced laryngeal elevation;Penetration/Aspiration during swallow;Penetration/Apiration after swallow;Pharyngeal residue - cp segment Pharyngeal -- Pharyngeal- Honey Cup -- Pharyngeal -- Pharyngeal- Nectar Teaspoon Delayed swallow initiation-vallecula;Reduced anterior laryngeal mobility;Reduced epiglottic inversion;Reduced laryngeal elevation;Reduced airway/laryngeal closure;Penetration/Aspiration during swallow;Penetration/Apiration after swallow;Trace aspiration;Pharyngeal residue - valleculae;Pharyngeal residue - pyriform;Pharyngeal residue - posterior pharnyx;Pharyngeal residue - cp segment;Inter-arytenoid space residue Pharyngeal -- Pharyngeal- Nectar Cup Delayed swallow initiation-vallecula;Reduced anterior laryngeal mobility;Reduced epiglottic inversion;Reduced laryngeal elevation;Reduced airway/laryngeal closure;Penetration/Aspiration during swallow;Penetration/Apiration after swallow;Trace aspiration;Pharyngeal residue -  valleculae;Pharyngeal residue - pyriform;Pharyngeal residue - posterior pharnyx;Pharyngeal residue - cp segment;Inter-arytenoid space residue Pharyngeal -- Pharyngeal- Nectar Straw -- Pharyngeal -- Pharyngeal- Thin Teaspoon Penetration/Aspiration during swallow;Trace aspiration;Compensatory strategies attempted (with notebox);Pharyngeal residue - cp segment Pharyngeal -- Pharyngeal- Thin Cup Penetration/Aspiration during swallow;Trace aspiration;Compensatory strategies attempted (with notebox);Pharyngeal residue - cp segment Pharyngeal -- Pharyngeal- Thin Straw -- Pharyngeal -- Pharyngeal- Puree Penetration/Aspiration during swallow;Penetration/Apiration after swallow;Delayed swallow initiation-vallecula;Reduced laryngeal elevation;Reduced airway/laryngeal closure;Reduced epiglottic inversion;Pharyngeal residue - valleculae;Pharyngeal residue - pyriform;Pharyngeal residue - posterior pharnyx;Pharyngeal residue - cp segment;Inter-arytenoid space residue Pharyngeal -- Pharyngeal- Mechanical Soft -- Pharyngeal -- Pharyngeal- Regular -- Pharyngeal -- Pharyngeal- Multi-consistency -- Pharyngeal -- Pharyngeal- Pill -- Pharyngeal -- Pharyngeal Comment --  Amelia H. Roddie Mc, CCC-SLP Speech Language Pathologist Wende Bushy 06/09/2018, 11:47 AM                   Scheduled Meds: . sodium chloride   Intravenous Once  . ipratropium-albuterol  3 mL Nebulization TID  . mometasone-formoterol  2 puff Inhalation BID   Continuous Infusions:   LOS: 15 days    Time spent: 30 minutes    Trixie Maclaren Darleen Crocker, DO [facility] Pager [phone]  If 7PM-7AM, please contact night-coverage [email or web address] Password [email or web address] 06/09/2018, 1:14 PM

## 2018-06-09 NOTE — Clinical Social Work Note (Signed)
CSW met with family who were bedside with patient.  Patient was asleep.  Pt's mother and sister confirmed that they want patient to be comfortable, and that they agree to referral to hospice facility.

## 2018-06-12 ENCOUNTER — Other Ambulatory Visit (HOSPITAL_COMMUNITY): Payer: Self-pay | Admitting: Nurse Practitioner

## 2018-06-18 NOTE — Progress Notes (Signed)
At 1148 50 mcg of Fentanyl was administered to patient, 50 mcg was wasted in stericycle witness by Vista Deck, RN

## 2018-06-18 NOTE — Discharge Summary (Signed)
Physician Discharge Summary  Timothy Parks JME:[id number] DOB: [birth year] DOA: 06-02-2018  PCP: Cory Munch, PA-C  Admit date: 06/02/18  Death date: 06/18/18 1200  Admitted From: Home  Disposition:  Expired   Discharge Diagnoses:  Principal Problem:   Sepsis ([place]) Active Problems:   Laryngeal cancer ([place])   Metastasis of unknown origin ([place])   Hyponatremia   Diarrhea   Lactic acidosis   COPD with chronic bronchitis (HCC)   CKD (chronic kidney disease) stage 3, GFR 30-59 ml/min (HCC)   Elevated troponin   Normocytic anemia   Pressure injury of skin   Palliative care by specialist   Weakness   Dysphagia   Odynophagia   Hypoxemia   At high risk for aspiration   Esophageal candidiasis ([place])   57 year old with past medical history relevant for laryngeal cancer status post platinum based chemotherapy and radiation, DVT on rivaroxaban, COPD, hypertension, hyperlipidemia, chronic pain, anxiety/depression who presented with fever, abdominal pain, diarrhea was found to have sepsis of unclear etiology and new metastases of unclear etiology.Hewas initially placed on multiple antibiotics to include cefepime and Flagyl as well as vancomycin and then he was transitioned to Augmentin and tapered off.  Unfortunately, he continued to have significant symptomatology from a respiratory standpoint and repeat chest x-ray demonstrated multifocal pneumonia with ongoing leukocytosis for which he was placed back on Zosyn and vancomycin on 11/21.  He had recurrent issues with aspiration and was seen by speech and language pathology and had remained n.p.o. due to his high aspiration risk.  It was noted that family members did not want a feeding tube in his overall nutritional status was becoming quite poor.  As a result of his ongoing aspiration he was deemed that IV antibiotic treatment for his presumed aspiration pneumonia would be futile.  Family members to include sister and mother  at bedside had confirmed that they simply wanted to keep him comfortable and therefore he was transition to comfort measures on 11/22.  His respiratory status continued to decline overnight and he subsequently expired on 06/19/23 at 1200.  Allergies  Allergen Reactions  . Tetracyclines & Related Anxiety    Consultations:  Oncology   Procedures/Studies: Dg Chest 2 View  Result Date: 06-02-18 CLINICAL DATA:  Weakness and diarrhea for 3 days, hypotension, history laryngeal cancer, hypertension, COPD, smoker EXAM: CHEST - 2 VIEW COMPARISON:  10/21/2017 FINDINGS: LEFT subclavian Port-A-Cath with tip projecting over SVC. Upper normal heart size. Mediastinal contours and pulmonary vascularity normal. Mild LEFT basilar atelectasis and questionable small subpulmonic pleural effusion. Lungs otherwise clear. No infiltrate, RIGHT pleural effusion or pneumothorax. IMPRESSION: LEFT basilar atelectasis and question small sub pulmonic pleural effusion. Electronically Signed   By: Lavonia Dana M.D.   On: 2018-06-02 16:00   Ct Soft Tissue Neck W Contrast  Result Date: 06/01/2018 CLINICAL DATA:  Locally advanced laryngeal cancer, status post chemo radiation June 2019. Follow-up evaluation. EXAM: CT NECK WITH CONTRAST TECHNIQUE: Multidetector CT imaging of the neck was performed using the standard protocol following the bolus administration of intravenous contrast. CONTRAST:  146mL ISOVUE-300 IOPAMIDOL (ISOVUE-300) INJECTION 61% COMPARISON:  CT neck April 18, 2018. FINDINGS: Mild motion degraded examination. PHARYNX AND LARYNX: Slightly increased edematous epiglottis and circumferential hypopharyngeal and to lesser extent laryngeal edema consistent with post treatment changes. Similar asymmetric fullness LEFT hypopharynx at site of prior mass. Patent airway. SALIVARY GLANDS: Nonacute. Similar mild postradiation changes of submandibular glands. THYROID: Normal. LYMPH NODES: Pretracheal lymph node was 3 mm, now 10 mm.  LEFT level IIa lymph node was 9 mm, now 11 mm. Stable 4 mm LEFT level 3 lymph node. No new cervical lymphadenopathy. VASCULAR: Mild calcific atherosclerosis carotid bifurcations. Advanced stenosis bilateral ICA, not tailored for evaluation. Hypoplastic RIGHT vertebral artery. LIMITED INTRACRANIAL: Normal. VISUALIZED ORBITS: Normal. MASTOIDS AND VISUALIZED PARANASAL SINUSES: Chronic LEFT maxillary sinusitis with bony remodeling. Mastoid air cells are well aerated. SKELETON: Nonacute. Multiple absent teeth. Severe LEFT C5-6 neural foraminal narrowing. UPPER CHEST: New RIGHT pleural effusion and patchy ground-glass opacities. Please see dedicated CT chest from same day, reported separately. LEFT chest Port-A-Cath. OTHER: Slightly increased chronic retropharyngeal effusion. Inflammatory changes bilateral carotid space similar to prior examination. No subcutaneous gas or radiopaque foreign bodies. No drainable fluid collections. IMPRESSION: 1. Mild motion degraded examination. 2. Increased postradiation changes of the neck, limiting assessment for residual tumor. Patent airway. 3. Slight increased size of LEFT level 2A lymph node and, increased size of pretracheal lymph node; worsening metastatic disease possible. Electronically Signed   By: Elon Alas M.D.   On: 06/01/2018 22:55   Ct Chest W Contrast  Result Date: 06/01/2018 CLINICAL DATA:  57 y/o M; malignant neoplasm of the larynx and sepsis. History of radiation and chemotherapy. EXAM: CT CHEST WITH CONTRAST TECHNIQUE: Multidetector CT imaging of the chest was performed during intravenous contrast administration. CONTRAST:  137mL ISOVUE-300 IOPAMIDOL (ISOVUE-300) INJECTION 61% COMPARISON:  06/02/2018 CT abdomen and pelvis. 10/03/2017 PET-CT. Concurrent CT of the neck. FINDINGS: Cardiovascular: Normal heart size. No pericardial effusion. Normal caliber thoracic aorta and main pulmonary artery. Mild aortic and moderate coronary artery calcific  atherosclerosis. Mediastinum/Nodes: Interval diffuse mild enlargement of mediastinal lymph nodes from the prior PET-CT, the largest in the AP window measuring 19 x 11 mm (AP by ML series 3, image 46). Left port catheter tip extends to the lower SVC. Normal thoracic esophagus. Patent central airways. Please refer to the concurrent CT of the neck for evaluation of the neck. Lungs/Pleura: Interval development of a moderate right and trace left pleural effusions, dependent consolidations within the lower lobes, and peripheral patchy ground-glass opacities throughout the upper lobes. Upper Abdomen: Numerous hypodense masses throughout the liver are grossly stable, for example in the liver segment 2 measuring 31 x 21 mm (AP by ML series 3, image 144) when compared with the prior CT of abdomen and pelvis. Stable 20 mm left adrenal mass. Musculoskeletal: Left lateral 8 posterolateral 10 expansile rib lesions with soft tissue components are stable from prior CT of abdomen and pelvis. IMPRESSION: 1. Interval development of bilateral pleural effusions, dependent consolidations in the lower lobes, and peripheral ground-glass opacities throughout the upper lobes suspicious for pneumonia. 2. Mild enlargement of mediastinal lymph nodes from prior PET-CT, probably reactive, but underlying metastatic disease is possible, attention at follow-up recommended. 3. Stable left adrenal mass, multiple liver masses, as well as left rib 8 and 10 expansile masses compatible with metastatic disease. 4. Mild aortic and moderate coronary artery calcific Electronically Signed   By: Kristine Garbe M.D.   On: 06/01/2018 22:56   Ct Abdomen Pelvis W Contrast  Result Date: 06/16/2018 CLINICAL DATA:  Cough, abdominal pain EXAM: CT ABDOMEN AND PELVIS WITH CONTRAST TECHNIQUE: Multidetector CT imaging of the abdomen and pelvis was performed using the standard protocol following bolus administration of intravenous contrast. CONTRAST:  153mL  ISOVUE-300 IOPAMIDOL (ISOVUE-300) INJECTION 61% COMPARISON:  PET-CT 10/03/2017 FINDINGS: Lower chest: Lung bases demonstrate trace left pleural effusion. Partial atelectasis at the left lung base. Heart size is normal. Hepatobiliary: Development  of multiple poorly defined hypodense liver masses consistent with metastatic disease. Caudate lobe lesion measures 3.2 x 2.7 cm. Inferior right hepatic lobe mass lesion measures 3.3 by 2.7 cm. Left hepatic lobe lesion measures 3 x 2.1 cm. Multiple additional liver masses are noted. Pancreas: Unremarkable. No pancreatic ductal dilatation or surrounding inflammatory changes. Spleen: Normal in size without focal abnormality. Adrenals/Urinary Tract: Stable 2 cm left adrenal mass, no change since prior PET-CT. Right adrenal gland is normal. No hydronephrosis. Punctate stones in the kidneys. Bladder normal Stomach/Bowel: Stomach is within normal limits. Appendix appears normal. No evidence of bowel wall thickening, distention, or inflammatory changes. Vascular/Lymphatic: Moderate aortic atherosclerosis. No aneurysm. Age indeterminate occlusion of the right external iliac artery. Reconstitution of flow enhancement within the right common femoral artery. Increased retroperitoneal lymph nodes, measuring up to 6 mm in size. Porta hepatis nodule measuring 1 cm in size. Small subcentimeter nodules anterior to the right colon. Reproductive: Prostate is unremarkable. Other: No free air or free fluid. Generalized haziness and stranding within the mesentery and anterior omentum. Musculoskeletal: 3.8 x 2.1 cm soft tissue mass within the left posterior chest wall, this surrounds the left tenth rib. Expansile left eighth anterior rib lesion. IMPRESSION: 1. Interim development of multiple poorly defined hypodense liver masses consistent with metastatic disease. 2. Interval increase in retroperitoneal and upper abdominal nodes, also concerning for metastatic disease. Interim finding of hazy  density within the mesentery and omentum. This finding in combination with interim finding of small intraperitoneal nodules anterior to the right colon is suspicious for peritoneal metastatic disease. 3. Development of 3.8 cm soft tissue mass surrounding the left tenth rib with additional expansile rib mass in the left eighth rib, concerning for metastatic disease. 4. Trace left pleural effusion with partial atelectasis at the left base 5. Age indeterminate occlusion of the right external iliac artery. Reconstitution of flow at the right common femoral artery. 6. Punctate stones within both kidneys. 7. 2 cm left adrenal mass, no change since 10/03/2017 Electronically Signed   By: Donavan Foil M.D.   On: 05/30/2018 19:12   US Venous Img Upper Uni Left  Result Date: 06/01/2018 CLINICAL DATA:  Left upper extremity pain and edema. History of prior DVT. Patient Is currently on coagulation. Evaluate for DVT. EXAM: LEFT UPPER EXTREMITY VENOUS DOPPLER ULTRASOUND TECHNIQUE: Gray-scale sonography with graded compression, as well as color Doppler and duplex ultrasound were performed to evaluate the upper extremity deep venous system from the level of the subclavian vein and including the jugular, axillary, basilic, radial, ulnar and upper cephalic vein. Spectral Doppler was utilized to evaluate flow at rest and with distal augmentation maneuvers. COMPARISON:  None. FINDINGS: Contralateral Subclavian Vein: Respiratory phasicity is normal and symmetric with the symptomatic side. No evidence of thrombus. Normal compressibility. Internal Jugular Vein: No evidence of thrombus. Normal compressibility, respiratory phasicity and response to augmentation. Subclavian Vein: No evidence of thrombus. Normal compressibility, respiratory phasicity and response to augmentation. Axillary Vein: No evidence of thrombus. Normal compressibility, respiratory phasicity and response to augmentation. Cephalic Vein: No evidence of thrombus.  Normal compressibility, respiratory phasicity and response to augmentation. Basilic Vein: No evidence of thrombus. Normal compressibility, respiratory phasicity and response to augmentation. Brachial Veins: No evidence of thrombus. Normal compressibility, respiratory phasicity and response to augmentation. Radial Veins: No evidence of thrombus. Normal compressibility, respiratory phasicity and response to augmentation. Ulnar Veins: No evidence of thrombus. Normal compressibility, respiratory phasicity and response to augmentation. Venous Reflux:  None visualized. Other Findings: There is  a minimal to moderate amount of subcutaneous edema about the left upper extremity. IMPRESSION: No evidence of acute or chronic DVT within the left upper extremity. Electronically Signed   By: Sandi Mariscal M.D.   On: 06/01/2018 15:35   Dg Chest Port 1 View  Result Date: 06/07/2018 CLINICAL DATA:  Hypoxemia EXAM: PORTABLE CHEST 1 VIEW COMPARISON:  CT chest dated 06/01/2018 FINDINGS: Multifocal patchy opacities, suspicious for multifocal pneumonia, less likely moderate interstitial edema. Suspected small bilateral pleural effusions. No pneumothorax. Left chest port terminates in the lower SVC. Cardiomegaly. IMPRESSION: Multifocal patchy opacities, suspicious for multifocal pneumonia, less likely moderate interstitial edema. Suspected small bilateral pleural effusions. Electronically Signed   By: Julian Hy M.D.   On: 06/07/2018 16:10   Dg Chest Port 1 View  Result Date: 05/29/2018 CLINICAL DATA:  Fever, sepsis, diarrhea, weakness, abdominal pain and cough. History of laryngeal carcinoma. EXAM: PORTABLE CHEST 1 VIEW COMPARISON:  06/07/2018 FINDINGS: The heart size and mediastinal contours are within normal limits. Stable appearance of Port-A-Cath via left subclavian vein with catheter tip in SVC. Low lung volumes with bibasilar atelectasis. There is no evidence of pulmonary edema, consolidation, pneumothorax, nodule or  pleural fluid. The visualized skeletal structures are unremarkable. IMPRESSION: Low lung volumes with bibasilar atelectasis. Electronically Signed   By: Aletta Edouard M.D.   On: 05/29/2018 08:19   Dg Swallowing Func-speech Pathology  Result Date: 06/09/2018 Objective Swallowing Evaluation: Type of Study: MBS-Modified Barium Swallow Study  Patient Details Name: Timothy Parks MRN: [id number] Date of Birth: Apr 21, 1961 Today's Date: 06/09/2018 Time: SLP Start Time (ACUTE ONLY): 1415 -SLP Stop Time (ACUTE ONLY): 1437 SLP Time Calculation (min) (ACUTE ONLY): 22 min Past Medical History: Past Medical History: Diagnosis Date . Anxiety  . Cancer ([place])   laryngeal . COPD (chronic obstructive pulmonary disease) ([place])   pt states he was told he has COPD by health department . Hypertension  . 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 BLADDER SURGERY   . MICROLARYNGOSCOPY N/A 09/20/2017  Procedure: MICRO LARYNGOSCOPY WITH BIOPSY OF LARYNGEAL MASS;  Surgeon: Leta Baptist, MD;  Location: [facility];  Service: ENT;  Laterality: N/A; . PORTACATH PLACEMENT Left 10/21/2017  Procedure: INSERTION PORT-A-CATH;  Surgeon: Virl Cagey, MD;  Location: [facility];  Service: General;  Laterality: Left; HPI: VILAS EDGERLY is a 57 y.o. male with medical history significant for laryngeal cancer status post chemo and radiation, COPD, hypertension, anxiety, DVT on Xarelto and tobacco use disorder who presented to ED on 11/7 with 3 weeks of diarrhea and weakness. RD reports Pt has experienced a decline in his swallowing function since initial consult (11/8) after admission during this acute stay; CXR 11/20 reveals: Multifocal patchy opacities, suspicious for multifocal pneumonia, less likely moderate interstitial edema. BSE indicating need for MBS  No data recorded Assessment / Plan / Recommendation CHL IP CLINICAL IMPRESSIONS 06/09/2018 Clinical  Impression Pt presents with severe sensorimotor oropharyngeal dysphagia characterized by penetration and eventual aspiration before, during and after the swallow of all textures presented (thin, NTL, HTL and puree). Oral phase with decreased bolus cohesion, decreased lingual propulsion and decreased base of tongue retraction. When increasing volume of thin liquids administered (tsp vs. cup sip) results in increased amount of penetration/aspiration with inconsistent reflexive cough. Nectar and Honey thick liquids with increased residue in the pharyngeal space with consistent penetration/aspiration followed by reflexive coughing and expectoration of residuals.  Decreased hyolaryngeal excursion and decreased duration and  strength of pharyngeal squeeze resulting in mod/severe valleculae residue of puree textures that was not cleared despite multiple swallows but was eventually coughed up and expectorated. Visualized questionable radiation induced changes in anatomy with suspected thickening of posterior pharyngeal wall, epiglottis and arytenoids. Decreased laryngeal vestibule closure noted throughout study with penetrates entering below the epiglottis and below the arytnoids. Chin tuck was not effective in decreasing or eliminating penetration/aspiration. Pt is placed at [place] aspiration risk which was reviewed with family by this SLP and Palliative NP, Megan. Family understands the risks of aspiration and requests to continue with least restrictive diet rather than to pursue alternative means of nutrition at this time. In this case, recommend diet of thin liquids (water, beverages, ice cream, soup, magic cup etc.) all liquids to be administered via tsp. Consider liberalizing diet to puree if Pt requests (considering comfort) however this would significantly increase aspiration risk secondary to significantly increased residuals with puree textures. Also note that diet will likely not meet nutritional needs which was  also discussed with family. Pt should be sitting upright for all PO and should only eat when alert. Recommend oral care routine QID or more frequently if indicated. SLP Visit Diagnosis Dysphagia, oropharyngeal phase (R13.12) Attention and concentration deficit following -- Frontal lobe and executive function deficit following -- Impact on safety and function Severe aspiration risk   CHL IP TREATMENT RECOMMENDATION 06/09/2018 Treatment Recommendations Therapy as outlined in treatment plan below   Prognosis 06/09/2018 Prognosis for Safe Diet Advancement Guarded Barriers to Reach Goals Severity of deficits Barriers/Prognosis Comment -- CHL IP DIET RECOMMENDATION 06/09/2018 SLP Diet Recommendations Thin liquid;No solids, see liquids Liquid Administration via -- Medication Administration Via alternative means Compensations Minimize environmental distractions;Slow rate;Small sips/bites;Hard cough after swallow;Clear throat after each swallow;Multiple dry swallows after each bite/sip Postural Changes --   CHL IP OTHER RECOMMENDATIONS 06/09/2018 Recommended Consults -- Oral Care Recommendations Oral care before and after PO Other Recommendations --   CHL IP FOLLOW UP RECOMMENDATIONS 06/09/2018 Follow up Recommendations 24 hour supervision/assistance;Skilled Nursing facility   CHL IP FREQUENCY AND DURATION 06/09/2018 Speech Therapy Frequency (ACUTE ONLY) min 1 x/week Treatment Duration 1 week      CHL IP ORAL PHASE 06/08/2018 Oral Phase Impaired Oral - Pudding Teaspoon -- Oral - Pudding Cup -- Oral - Honey Teaspoon Decreased bolus cohesion;Piecemeal swallowing;Reduced posterior propulsion Oral - Honey Cup -- Oral - Nectar Teaspoon Decreased bolus cohesion;Piecemeal swallowing Oral - Nectar Cup Piecemeal swallowing;Decreased bolus cohesion Oral - Nectar Straw -- Oral - Thin Teaspoon Piecemeal swallowing;Premature spillage;Delayed oral transit Oral - Thin Cup Piecemeal swallowing;Delayed oral transit;Premature spillage Oral -  Thin Straw -- Oral - Puree Premature spillage;Delayed oral transit;Piecemeal swallowing;Lingual/palatal residue Oral - Mech Soft -- Oral - Regular -- Oral - Multi-Consistency -- Oral - Pill -- Oral Phase - Comment --  CHL IP PHARYNGEAL PHASE 06/08/2018 Pharyngeal Phase Impaired Pharyngeal- Pudding Teaspoon -- Pharyngeal -- Pharyngeal- Pudding Cup -- Pharyngeal -- Pharyngeal- Honey Teaspoon Delayed swallow initiation-vallecula;Reduced airway/laryngeal closure;Trace aspiration;Pharyngeal residue - valleculae;Pharyngeal residue - pyriform;Pharyngeal residue - posterior pharnyx;Inter-arytenoid space residue;Reduced epiglottic inversion;Reduced laryngeal elevation;Penetration/Aspiration during swallow;Penetration/Apiration after swallow;Pharyngeal residue - cp segment Pharyngeal -- Pharyngeal- Honey Cup -- Pharyngeal -- Pharyngeal- Nectar Teaspoon Delayed swallow initiation-vallecula;Reduced anterior laryngeal mobility;Reduced epiglottic inversion;Reduced laryngeal elevation;Reduced airway/laryngeal closure;Penetration/Aspiration during swallow;Penetration/Apiration after swallow;Trace aspiration;Pharyngeal residue - valleculae;Pharyngeal residue - pyriform;Pharyngeal residue - posterior pharnyx;Pharyngeal residue - cp segment;Inter-arytenoid space residue Pharyngeal -- Pharyngeal- Nectar Cup Delayed swallow initiation-vallecula;Reduced anterior laryngeal mobility;Reduced epiglottic inversion;Reduced laryngeal elevation;Reduced airway/laryngeal closure;Penetration/Aspiration during swallow;Penetration/Apiration  after swallow;Trace aspiration;Pharyngeal residue - valleculae;Pharyngeal residue - pyriform;Pharyngeal residue - posterior pharnyx;Pharyngeal residue - cp segment;Inter-arytenoid space residue Pharyngeal -- Pharyngeal- Nectar Straw -- Pharyngeal -- Pharyngeal- Thin Teaspoon Penetration/Aspiration during swallow;Trace aspiration;Compensatory strategies attempted (with notebox);Pharyngeal residue - cp segment  Pharyngeal -- Pharyngeal- Thin Cup Penetration/Aspiration during swallow;Trace aspiration;Compensatory strategies attempted (with notebox);Pharyngeal residue - cp segment Pharyngeal -- Pharyngeal- Thin Straw -- Pharyngeal -- Pharyngeal- Puree Penetration/Aspiration during swallow;Penetration/Apiration after swallow;Delayed swallow initiation-vallecula;Reduced laryngeal elevation;Reduced airway/laryngeal closure;Reduced epiglottic inversion;Pharyngeal residue - valleculae;Pharyngeal residue - pyriform;Pharyngeal residue - posterior pharnyx;Pharyngeal residue - cp segment;Inter-arytenoid space residue Pharyngeal -- Pharyngeal- Mechanical Soft -- Pharyngeal -- Pharyngeal- Regular -- Pharyngeal -- Pharyngeal- Multi-consistency -- Pharyngeal -- Pharyngeal- Pill -- Pharyngeal -- Pharyngeal Comment --  Amelia H. Roddie Mc, CCC-SLP Speech Language Pathologist Wende Bushy 06/09/2018, 11:47 AM                The results of significant diagnostics from this hospitalization (including imaging, microbiology, ancillary and laboratory) are listed below for reference.     Microbiology: No results found for this or any previous visit (from the past 240 hour(s)).   Labs: BNP (last 3 results) Recent Labs    06/17/2018 1451  BNP 756.4*   Basic Metabolic Panel: Recent Labs  Lab 06/05/18 0605 06/06/18 0451 06/07/18 0534 06/08/18 0510 06/09/18 0459  NA 134* 135 137 137 139  K 4.1 4.6 4.3 4.1 4.0  CL 109 110 112* 111 115*  CO2 18* 15* 19* 19* 18*  GLUCOSE 103* 92 116* 105* 112*  BUN 39* 42* 47* 49* 54*  CREATININE 2.07* 2.05* 2.13* 2.19* 2.29*  CALCIUM 7.9* 7.9* 7.5* 7.5* 7.4*   Liver Function Tests: No results for input(s): AST, ALT, ALKPHOS, BILITOT, PROT, ALBUMIN in the last 168 hours. No results for input(s): LIPASE, AMYLASE in the last 168 hours. No results for input(s): AMMONIA in the last 168 hours. CBC: Recent Labs  Lab 06/05/18 0605 06/08/18 0510 06/09/18 0459  WBC 14.1* 14.4*  16.9*  HGB 8.6* 7.9* 7.5*  HCT 27.5* 26.2* 25.1*  MCV 92.3 91.6 91.6  PLT 429* 443* 427*   Cardiac Enzymes: No results for input(s): CKTOTAL, CKMB, CKMBINDEX, TROPONINI in the last 168 hours. BNP: Invalid input(s): POCBNP CBG: No results for input(s): GLUCAP in the last 168 hours. D-Dimer No results for input(s): DDIMER in the last 72 hours. Hgb A1c No results for input(s): HGBA1C in the last 72 hours. Lipid Profile No results for input(s): CHOL, HDL, LDLCALC, TRIG, CHOLHDL, LDLDIRECT in the last 72 hours. Thyroid function studies No results for input(s): TSH, T4TOTAL, T3FREE, THYROIDAB in the last 72 hours.  Invalid input(s): FREET3 Anemia work up No results for input(s): VITAMINB12, FOLATE, FERRITIN, TIBC, IRON, RETICCTPCT in the last 72 hours. Urinalysis    Component Value Date/Time   COLORURINE YELLOW 06/13/2018 1740   APPEARANCEUR CLOUDY (A) 05/21/2018 1740   LABSPEC 1.016 06/16/2018 1740   PHURINE 5.0 06/08/2018 1740   GLUCOSEU NEGATIVE 05/19/2018 1740   HGBUR NEGATIVE 05/19/2018 1740   BILIRUBINUR NEGATIVE 06/13/2018 1740   KETONESUR NEGATIVE 06/09/2018 1740   PROTEINUR NEGATIVE 05/19/2018 1740   NITRITE NEGATIVE 05/28/2018 1740   LEUKOCYTESUR NEGATIVE 05/30/2018 1740   Sepsis Labs Invalid input(s): PROCALCITONIN,  WBC,  LACTICIDVEN Microbiology No results found for this or any previous visit (from the past 240 hour(s)).   Time coordinating discharge: 35 minutes  SIGNED:   Rodena Goldmann, DO [facility] 06-14-18, 1:14 PM Pager [phone]  If 7PM-7AM, please contact  night-coverage [email or web address] Password [email or web address]

## 2018-06-18 NOTE — Progress Notes (Signed)
At 1149 1 mg of Ativan was administered to patient, 1mg  was wasted in stericycle and was witness by Vista Deck, RN

## 2018-06-18 DEATH — deceased

## 2018-07-26 LAB — FUNGUS CULTURE, BLOOD
CULTURE: NO GROWTH
Special Requests: ADEQUATE

## 2019-08-03 IMAGING — PT NM PET TUM IMG INITIAL (PI) SKULL BASE T - THIGH
6 series · 22 of 25 positions shown · non-contrast
Comparison: Neck CT from 09/27/2017

CLINICAL DATA: Initial treatment strategy for left-sided laryngeal
invasive squamous cell carcinoma with basaloid features..

EXAM:
NUCLEAR MEDICINE PET SKULL BASE TO THIGH
TECHNIQUE: 14.2 mCi F-18 FDG was injected intravenously. Full-ring PET imaging
was performed from the skull base to thigh after the radiotracer. CT
data was obtained and used for attenuation correction and anatomic
localization.
Fasting blood glucose: 101 mg/dl
Mediastinal blood pool activity: SUV max

[axial ct wb fusion · 8 of 192 slices shown (1 of 2)]
[im 1/192]
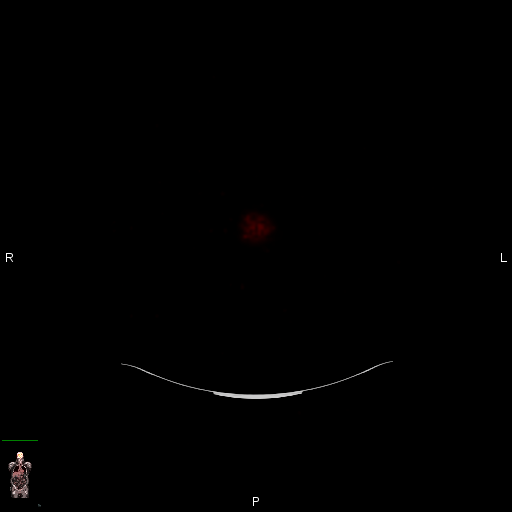
[im 24/192]
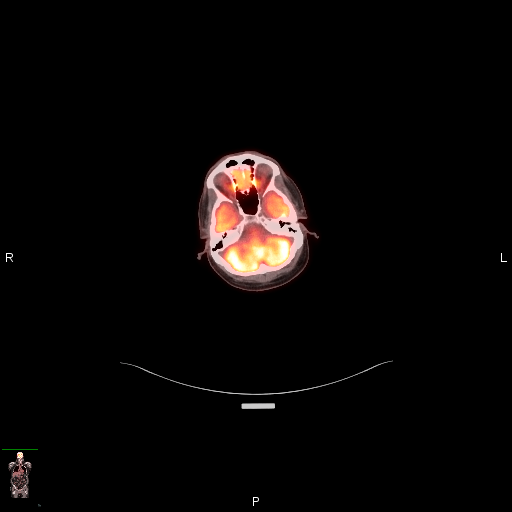
[im 48/192]
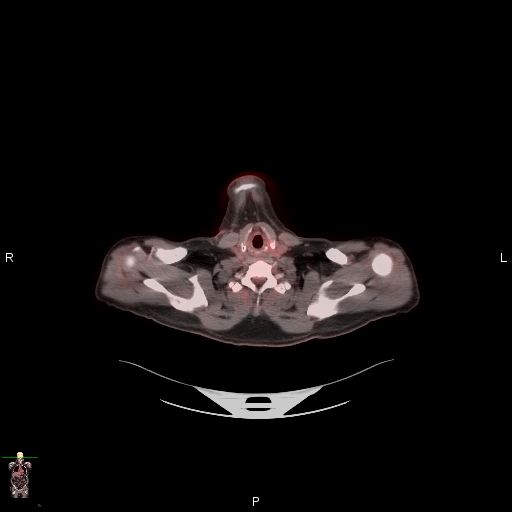
[im 72/192]
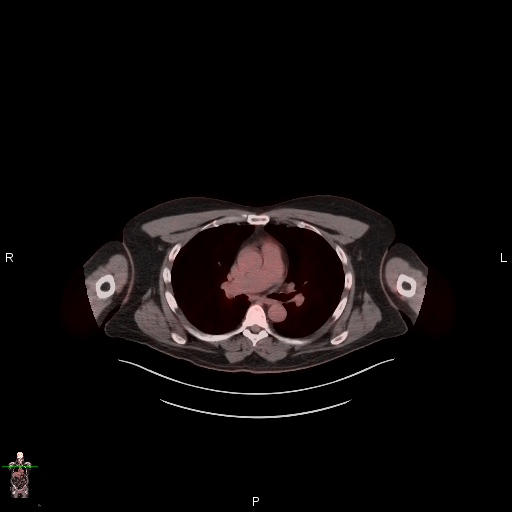
[im 120/192]
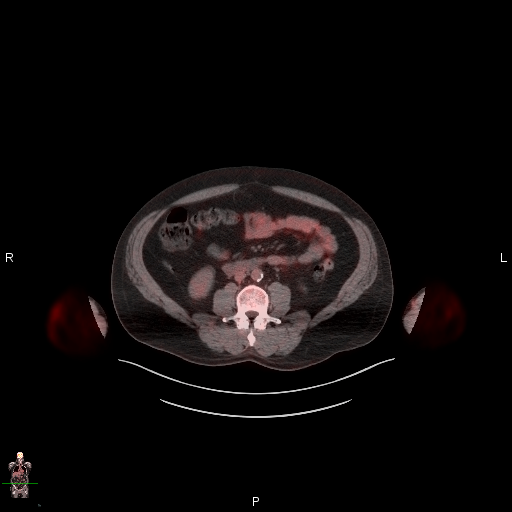
[im 144/192]
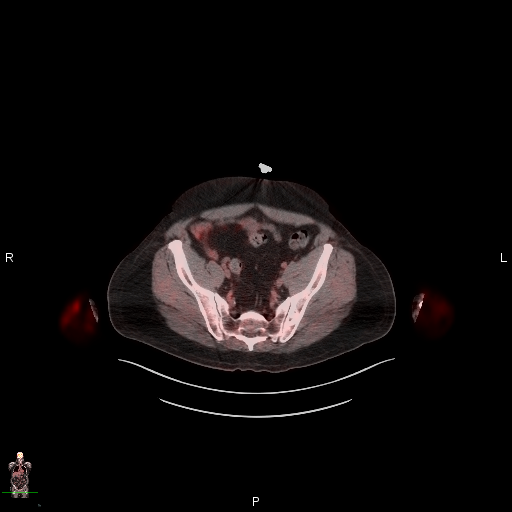
[im 168/192]
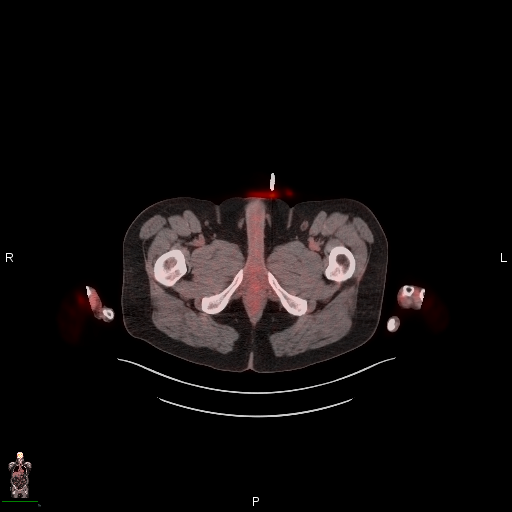
[im 192/192]
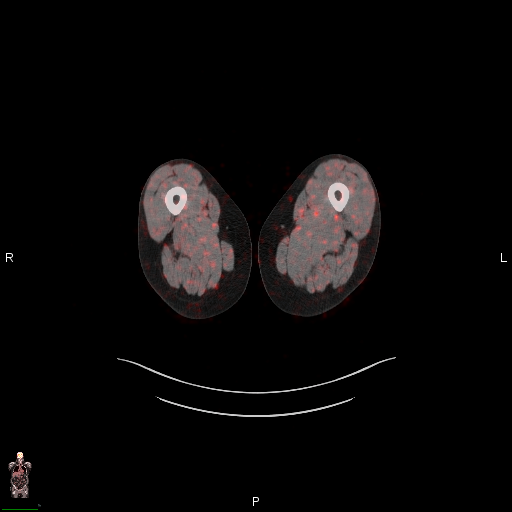

[coronal ct wb fusion · 2 of 48 slices shown (1 of 2)]
[im 1/48]
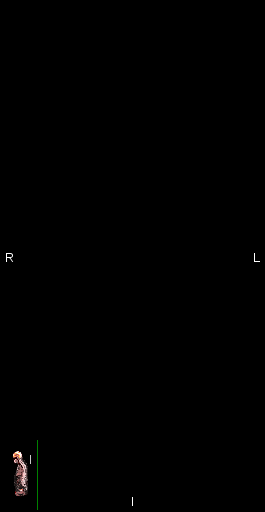
[im 48/48]
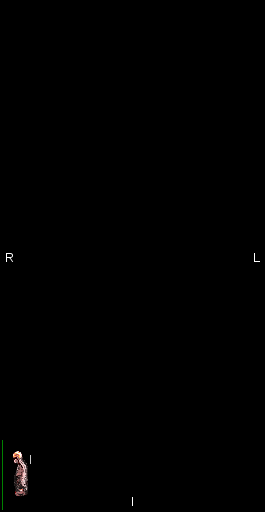

[mip · 1 of 48 slices shown (1 of 2)]
[im 1/48]
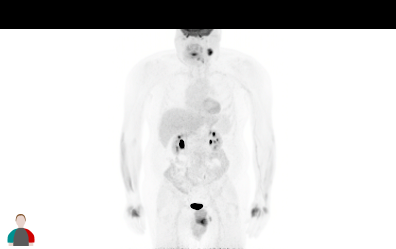

[axial ct wb fusion · 7 of 192 slices shown (2 of 2)]
[im 1/192]
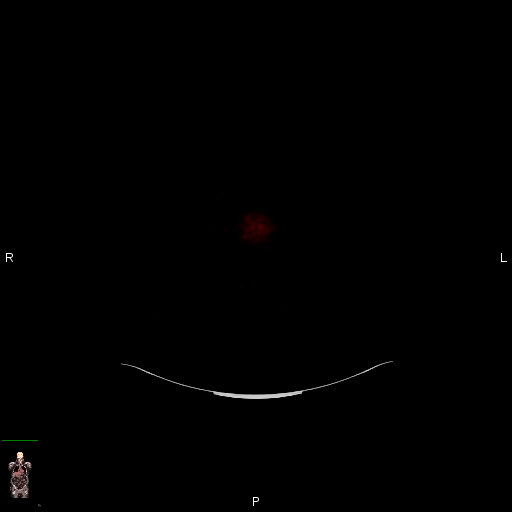
[im 28/192]
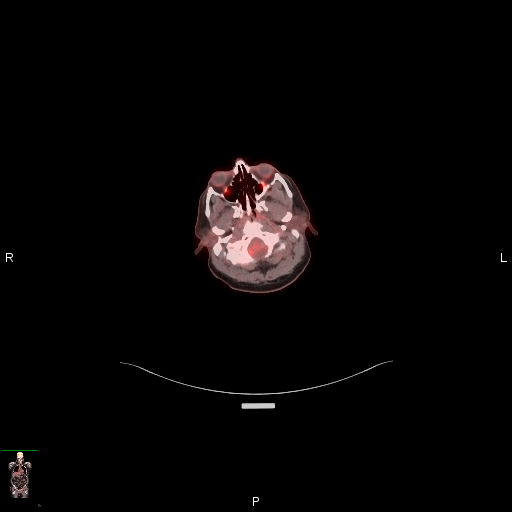
[im 55/192]
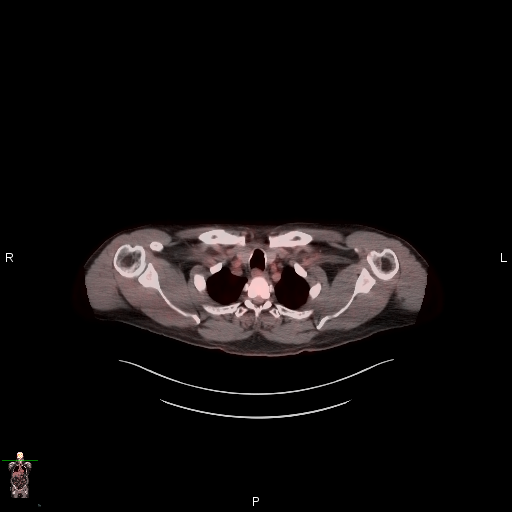
[im 82/192]
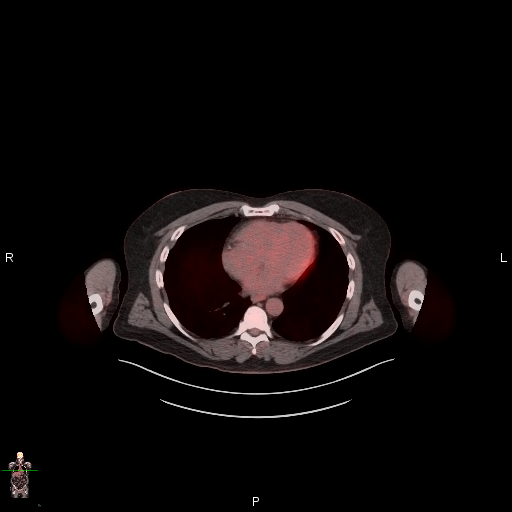
[im 110/192]
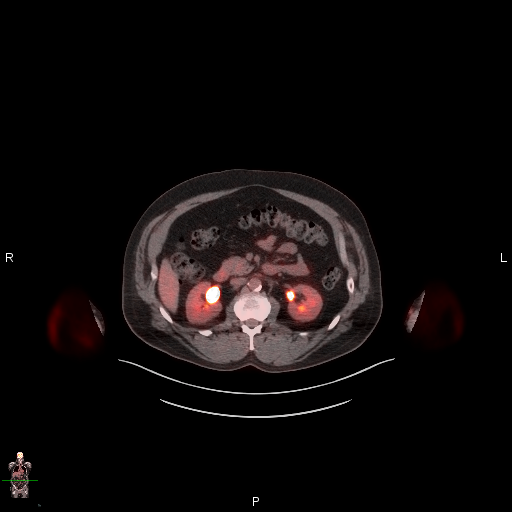
[im 137/192]
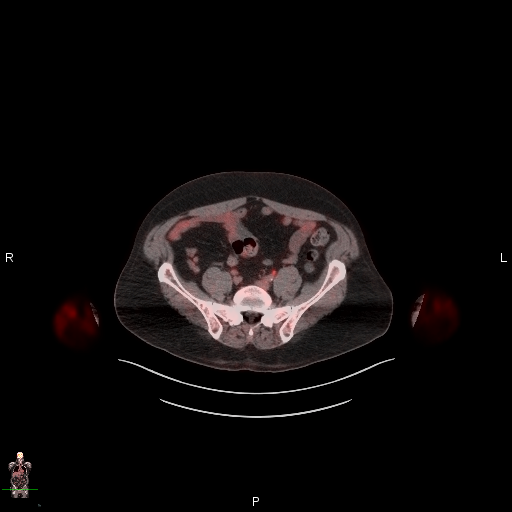
[im 164/192]
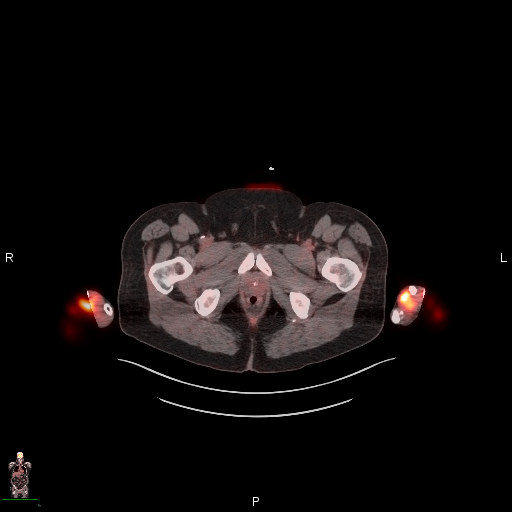

[coronal ct wb fusion · 2 of 48 slices shown (2 of 2)]
[im 1/48]
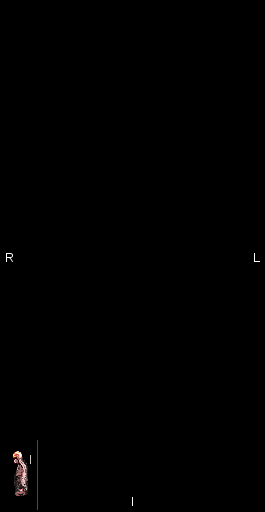
[im 48/48]
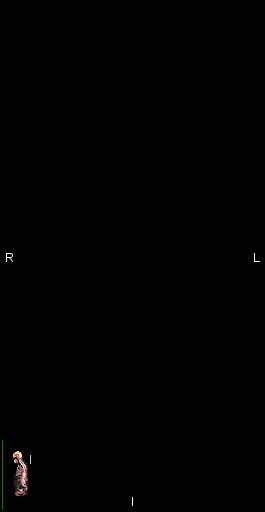

[mip · 2 of 48 slices shown (2 of 2)]
[im 1/48]
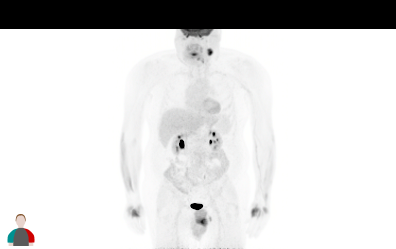
[im 48/48]
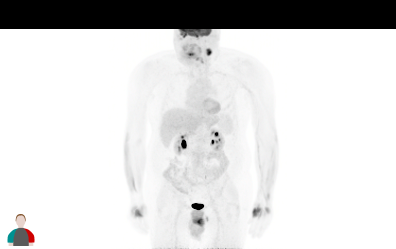

[22 of 25 positions shown; findings below may reference images not displayed]

FINDINGS: HEAD/NECK: In the vicinity of the left aryepiglottic fold and
extending somewhat caudad along the adjacent left paralaryngeal
space with mild effacement of the left piriform sinus, there is a
focal mass with hypermetabolic activity with maximum SUV 6.9.

A left level IIa lymph node measures 2.3 by 2.8 cm on image 83/3 and
has a maximum SUV of

A left level III lymph node measuring 0.6 cm in short axis on image
96/3 has a maximum SUV of 2.8.

Incidental CT findings: Mild chronic bilateral maxillary sinusitis.

CHEST: No significant abnormal hypermetabolic activity in the chest.

Incidental CT findings: Coronary, aortic arch, and branch vessel
atherosclerotic vascular disease.

ABDOMEN/PELVIS: No significant abnormal hypermetabolic activity in
the abdomen.

Incidental CT findings: Mildly contracted gallbladder. 2.1 by 2.1 cm
low-density mass of the left adrenal gland, internal density -7
Hounsfield units, compatible with adrenal adenoma. Bilateral
nonobstructive nephrolithiasis, with several small bilateral renal
calculi measuring in the 2-3 mm size range. No hydronephrosis.
Aortoiliac atherosclerotic vascular disease.

SKELETON: No significant abnormal skeletal activity. Note is made of
physiologic activity in the musculature of the hands and forearms.

Incidental CT findings: none
IMPRESSION: 1. Hypermetabolic activity along the left aryepiglottic fold, left
piriform sinus, and left paralaryngeal space compatible with primary
malignancy. A left level IIa lymph node measuring 2.3 cm in short
axis has a maximum SUV of 15.3, highly hypermetabolic and compatible
with malignancy. A 0.6 cm in short axis left level III lymph node
has a maximum SUV of only 2.8, minimally above blood pool, equivocal
for malignant involvement.
2. No involvement of the chest, abdomen/pelvis, or skeleton
identified.
3. Other imaging findings of potential clinical significance: Mild
chronic bilateral maxillary sinusitis. Aortic Atherosclerosis
([id number]). Coronary atherosclerosis. Left adrenal adenoma.
Bilateral nonobstructive nephrolithiasis.
